# Patient Record
Sex: Female | Born: 1935 | Race: White | Hispanic: No | Smoking: Never smoker
Health system: Southern US, Community
[De-identification: ages and names within clinical notes are randomized; demographics above are authoritative.]

## PROBLEM LIST (undated history)

## (undated) DIAGNOSIS — N183 Chronic kidney disease, stage 3 unspecified: Secondary | ICD-10-CM

## (undated) DIAGNOSIS — I719 Aortic aneurysm of unspecified site, without rupture: Secondary | ICD-10-CM

## (undated) DIAGNOSIS — E785 Hyperlipidemia, unspecified: Secondary | ICD-10-CM

## (undated) DIAGNOSIS — M1990 Unspecified osteoarthritis, unspecified site: Secondary | ICD-10-CM

## (undated) DIAGNOSIS — E669 Obesity, unspecified: Secondary | ICD-10-CM

## (undated) DIAGNOSIS — I82409 Acute embolism and thrombosis of unspecified deep veins of unspecified lower extremity: Secondary | ICD-10-CM

## (undated) DIAGNOSIS — F32A Depression, unspecified: Secondary | ICD-10-CM

## (undated) DIAGNOSIS — Z86711 Personal history of pulmonary embolism: Secondary | ICD-10-CM

## (undated) DIAGNOSIS — I4891 Unspecified atrial fibrillation: Secondary | ICD-10-CM

## (undated) DIAGNOSIS — I5032 Chronic diastolic (congestive) heart failure: Secondary | ICD-10-CM

## (undated) DIAGNOSIS — Z9289 Personal history of other medical treatment: Secondary | ICD-10-CM

## (undated) DIAGNOSIS — C44300 Unspecified malignant neoplasm of skin of unspecified part of face: Secondary | ICD-10-CM

## (undated) DIAGNOSIS — S7292XA Unspecified fracture of left femur, initial encounter for closed fracture: Secondary | ICD-10-CM

## (undated) DIAGNOSIS — I10 Essential (primary) hypertension: Secondary | ICD-10-CM

## (undated) DIAGNOSIS — F329 Major depressive disorder, single episode, unspecified: Secondary | ICD-10-CM

## (undated) HISTORY — PX: HYSTEROTOMY: SHX1776

## (undated) HISTORY — DX: Essential (primary) hypertension: I10

## (undated) HISTORY — PX: CHOLECYSTECTOMY: SHX55

## (undated) HISTORY — DX: Major depressive disorder, single episode, unspecified: F32.9

## (undated) HISTORY — PX: CATARACT EXTRACTION: SUR2

## (undated) HISTORY — DX: Hyperlipidemia, unspecified: E78.5

## (undated) HISTORY — PX: HERNIA REPAIR: SHX51

## (undated) HISTORY — PX: VENA CAVA FILTER PLACEMENT: SUR1032

## (undated) HISTORY — DX: Unspecified osteoarthritis, unspecified site: M19.90

## (undated) HISTORY — PX: OSTEOTOMY: SHX137

## (undated) HISTORY — DX: Obesity, unspecified: E66.9

## (undated) HISTORY — DX: Unspecified atrial fibrillation: I48.91

## (undated) HISTORY — DX: Aortic aneurysm of unspecified site, without rupture: I71.9

## (undated) HISTORY — DX: Depression, unspecified: F32.A

---
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
## 1998-03-16 ENCOUNTER — Encounter: Admission: RE | Admit: 1998-03-16 | Discharge: 1998-03-16 | Payer: Self-pay | Admitting: Family Medicine

## 1998-04-13 ENCOUNTER — Encounter: Admission: RE | Admit: 1998-04-13 | Discharge: 1998-04-13 | Payer: Self-pay | Admitting: Family Medicine

## 1998-04-15 ENCOUNTER — Encounter: Admission: RE | Admit: 1998-04-15 | Discharge: 1998-04-15 | Payer: Self-pay | Admitting: Family Medicine

## 1998-05-11 ENCOUNTER — Encounter: Admission: RE | Admit: 1998-05-11 | Discharge: 1998-05-11 | Payer: Self-pay | Admitting: Family Medicine

## 1998-05-25 ENCOUNTER — Encounter: Admission: RE | Admit: 1998-05-25 | Discharge: 1998-05-25 | Payer: Self-pay | Admitting: Family Medicine

## 1998-06-22 ENCOUNTER — Encounter: Admission: RE | Admit: 1998-06-22 | Discharge: 1998-06-22 | Payer: Self-pay | Admitting: Family Medicine

## 1998-08-01 ENCOUNTER — Encounter: Admission: RE | Admit: 1998-08-01 | Discharge: 1998-08-01 | Payer: Self-pay | Admitting: Family Medicine

## 1998-08-15 ENCOUNTER — Encounter: Admission: RE | Admit: 1998-08-15 | Discharge: 1998-08-15 | Payer: Self-pay | Admitting: Family Medicine

## 1998-09-16 ENCOUNTER — Encounter: Admission: RE | Admit: 1998-09-16 | Discharge: 1998-09-16 | Payer: Self-pay | Admitting: Family Medicine

## 1998-09-29 ENCOUNTER — Encounter: Admission: RE | Admit: 1998-09-29 | Discharge: 1998-09-29 | Payer: Self-pay | Admitting: Family Medicine

## 1998-10-28 ENCOUNTER — Ambulatory Visit (HOSPITAL_COMMUNITY): Admission: RE | Admit: 1998-10-28 | Discharge: 1998-10-28 | Payer: Self-pay

## 1998-10-28 ENCOUNTER — Encounter: Admission: RE | Admit: 1998-10-28 | Discharge: 1998-10-28 | Payer: Self-pay | Admitting: Family Medicine

## 1998-11-25 ENCOUNTER — Encounter: Admission: RE | Admit: 1998-11-25 | Discharge: 1998-11-25 | Payer: Self-pay | Admitting: Sports Medicine

## 1998-11-30 ENCOUNTER — Ambulatory Visit (HOSPITAL_COMMUNITY): Admission: RE | Admit: 1998-11-30 | Discharge: 1998-11-30 | Payer: Self-pay

## 1998-12-02 ENCOUNTER — Encounter: Admission: RE | Admit: 1998-12-02 | Discharge: 1998-12-02 | Payer: Self-pay | Admitting: Family Medicine

## 1999-01-06 ENCOUNTER — Encounter: Admission: RE | Admit: 1999-01-06 | Discharge: 1999-01-06 | Payer: Self-pay | Admitting: Family Medicine

## 1999-01-06 ENCOUNTER — Ambulatory Visit (HOSPITAL_COMMUNITY): Admission: RE | Admit: 1999-01-06 | Discharge: 1999-01-06 | Payer: Self-pay | Admitting: Family Medicine

## 1999-01-31 ENCOUNTER — Encounter: Admission: RE | Admit: 1999-01-31 | Discharge: 1999-01-31 | Payer: Self-pay | Admitting: Sports Medicine

## 1999-03-15 ENCOUNTER — Encounter: Admission: RE | Admit: 1999-03-15 | Discharge: 1999-03-15 | Payer: Self-pay | Admitting: Family Medicine

## 1999-04-10 ENCOUNTER — Encounter: Admission: RE | Admit: 1999-04-10 | Discharge: 1999-04-10 | Payer: Self-pay | Admitting: Family Medicine

## 1999-04-24 ENCOUNTER — Encounter: Admission: RE | Admit: 1999-04-24 | Discharge: 1999-04-24 | Payer: Self-pay | Admitting: Family Medicine

## 1999-06-21 ENCOUNTER — Ambulatory Visit (HOSPITAL_COMMUNITY): Admission: RE | Admit: 1999-06-21 | Discharge: 1999-06-21 | Payer: Self-pay | Admitting: *Deleted

## 1999-07-28 ENCOUNTER — Encounter: Admission: RE | Admit: 1999-07-28 | Discharge: 1999-07-28 | Payer: Self-pay | Admitting: Family Medicine

## 1999-09-01 ENCOUNTER — Encounter: Admission: RE | Admit: 1999-09-01 | Discharge: 1999-09-01 | Payer: Self-pay | Admitting: Family Medicine

## 1999-10-20 ENCOUNTER — Encounter (INDEPENDENT_AMBULATORY_CARE_PROVIDER_SITE_OTHER): Payer: Self-pay | Admitting: *Deleted

## 1999-10-23 ENCOUNTER — Encounter: Admission: RE | Admit: 1999-10-23 | Discharge: 1999-10-23 | Payer: Self-pay | Admitting: Family Medicine

## 1999-10-23 ENCOUNTER — Other Ambulatory Visit: Admission: RE | Admit: 1999-10-23 | Discharge: 1999-11-09 | Payer: Self-pay | Admitting: Obstetrics & Gynecology

## 1999-11-06 ENCOUNTER — Encounter: Admission: RE | Admit: 1999-11-06 | Discharge: 1999-11-06 | Payer: Self-pay | Admitting: Sports Medicine

## 2000-01-22 ENCOUNTER — Encounter: Admission: RE | Admit: 2000-01-22 | Discharge: 2000-01-22 | Payer: Self-pay | Admitting: Family Medicine

## 2000-07-04 ENCOUNTER — Encounter: Admission: RE | Admit: 2000-07-04 | Discharge: 2000-07-04 | Payer: Self-pay | Admitting: Family Medicine

## 2000-07-25 ENCOUNTER — Encounter: Admission: RE | Admit: 2000-07-25 | Discharge: 2000-07-25 | Payer: Self-pay | Admitting: Family Medicine

## 2000-09-17 ENCOUNTER — Encounter: Admission: RE | Admit: 2000-09-17 | Discharge: 2000-09-17 | Payer: Self-pay | Admitting: Family Medicine

## 2000-12-25 ENCOUNTER — Encounter: Admission: RE | Admit: 2000-12-25 | Discharge: 2000-12-25 | Payer: Self-pay | Admitting: Family Medicine

## 2001-02-04 ENCOUNTER — Encounter: Admission: RE | Admit: 2001-02-04 | Discharge: 2001-02-04 | Payer: Self-pay | Admitting: *Deleted

## 2001-02-04 ENCOUNTER — Encounter: Admission: RE | Admit: 2001-02-04 | Discharge: 2001-02-04 | Payer: Self-pay | Admitting: Family Medicine

## 2001-02-11 ENCOUNTER — Encounter: Admission: RE | Admit: 2001-02-11 | Discharge: 2001-02-11 | Payer: Self-pay | Admitting: Family Medicine

## 2001-04-16 ENCOUNTER — Encounter: Admission: RE | Admit: 2001-04-16 | Discharge: 2001-04-16 | Payer: Self-pay | Admitting: Family Medicine

## 2001-04-28 ENCOUNTER — Encounter: Admission: RE | Admit: 2001-04-28 | Discharge: 2001-04-28 | Payer: Self-pay | Admitting: Sports Medicine

## 2001-06-23 ENCOUNTER — Encounter: Admission: RE | Admit: 2001-06-23 | Discharge: 2001-06-23 | Payer: Self-pay | Admitting: Sports Medicine

## 2001-06-26 ENCOUNTER — Encounter: Admission: RE | Admit: 2001-06-26 | Discharge: 2001-06-26 | Payer: Self-pay | Admitting: Family Medicine

## 2001-07-15 ENCOUNTER — Encounter: Admission: RE | Admit: 2001-07-15 | Discharge: 2001-07-15 | Payer: Self-pay | Admitting: Family Medicine

## 2001-07-31 ENCOUNTER — Encounter: Admission: RE | Admit: 2001-07-31 | Discharge: 2001-07-31 | Payer: Self-pay | Admitting: Family Medicine

## 2001-09-17 ENCOUNTER — Encounter: Admission: RE | Admit: 2001-09-17 | Discharge: 2001-09-17 | Payer: Self-pay | Admitting: Family Medicine

## 2001-10-29 ENCOUNTER — Encounter: Admission: RE | Admit: 2001-10-29 | Discharge: 2001-10-29 | Payer: Self-pay | Admitting: Family Medicine

## 2001-12-24 ENCOUNTER — Encounter: Payer: Self-pay | Admitting: Sports Medicine

## 2001-12-24 ENCOUNTER — Encounter: Admission: RE | Admit: 2001-12-24 | Discharge: 2001-12-24 | Payer: Self-pay | Admitting: Sports Medicine

## 2001-12-24 ENCOUNTER — Encounter: Admission: RE | Admit: 2001-12-24 | Discharge: 2001-12-24 | Payer: Self-pay | Admitting: Family Medicine

## 2001-12-31 ENCOUNTER — Encounter: Admission: RE | Admit: 2001-12-31 | Discharge: 2001-12-31 | Payer: Self-pay | Admitting: Sports Medicine

## 2001-12-31 ENCOUNTER — Encounter: Payer: Self-pay | Admitting: Sports Medicine

## 2002-01-07 ENCOUNTER — Encounter: Admission: RE | Admit: 2002-01-07 | Discharge: 2002-01-07 | Payer: Self-pay | Admitting: Family Medicine

## 2002-01-21 ENCOUNTER — Encounter: Admission: RE | Admit: 2002-01-21 | Discharge: 2002-01-21 | Payer: Self-pay | Admitting: Family Medicine

## 2002-01-26 ENCOUNTER — Encounter: Admission: RE | Admit: 2002-01-26 | Discharge: 2002-01-26 | Payer: Self-pay | Admitting: *Deleted

## 2002-02-04 ENCOUNTER — Encounter: Admission: RE | Admit: 2002-02-04 | Discharge: 2002-02-04 | Payer: Self-pay | Admitting: Family Medicine

## 2002-02-09 ENCOUNTER — Encounter: Admission: RE | Admit: 2002-02-09 | Discharge: 2002-02-09 | Payer: Self-pay | Admitting: Sports Medicine

## 2002-02-09 ENCOUNTER — Encounter: Payer: Self-pay | Admitting: Sports Medicine

## 2002-02-20 ENCOUNTER — Encounter: Admission: RE | Admit: 2002-02-20 | Discharge: 2002-02-20 | Payer: Self-pay | Admitting: Family Medicine

## 2002-02-23 ENCOUNTER — Encounter: Admission: RE | Admit: 2002-02-23 | Discharge: 2002-02-23 | Payer: Self-pay | Admitting: Sports Medicine

## 2002-02-23 ENCOUNTER — Encounter: Payer: Self-pay | Admitting: Sports Medicine

## 2002-03-09 ENCOUNTER — Encounter: Admission: RE | Admit: 2002-03-09 | Discharge: 2002-03-09 | Payer: Self-pay | Admitting: Sports Medicine

## 2002-03-09 ENCOUNTER — Encounter: Payer: Self-pay | Admitting: Sports Medicine

## 2002-03-23 ENCOUNTER — Encounter: Admission: RE | Admit: 2002-03-23 | Discharge: 2002-03-23 | Payer: Self-pay | Admitting: Family Medicine

## 2002-05-21 ENCOUNTER — Encounter: Admission: RE | Admit: 2002-05-21 | Discharge: 2002-05-21 | Payer: Self-pay | Admitting: Family Medicine

## 2002-06-23 ENCOUNTER — Encounter: Admission: RE | Admit: 2002-06-23 | Discharge: 2002-06-23 | Payer: Self-pay | Admitting: Family Medicine

## 2002-07-23 ENCOUNTER — Encounter: Admission: RE | Admit: 2002-07-23 | Discharge: 2002-07-23 | Payer: Self-pay | Admitting: Family Medicine

## 2002-08-26 ENCOUNTER — Encounter: Admission: RE | Admit: 2002-08-26 | Discharge: 2002-08-26 | Payer: Self-pay | Admitting: Family Medicine

## 2002-10-26 ENCOUNTER — Encounter: Admission: RE | Admit: 2002-10-26 | Discharge: 2002-10-26 | Payer: Self-pay | Admitting: Family Medicine

## 2002-11-19 HISTORY — PX: TOTAL KNEE ARTHROPLASTY: SHX125

## 2002-11-25 ENCOUNTER — Encounter: Admission: RE | Admit: 2002-11-25 | Discharge: 2002-11-25 | Payer: Self-pay | Admitting: Family Medicine

## 2003-01-19 ENCOUNTER — Encounter: Admission: RE | Admit: 2003-01-19 | Discharge: 2003-01-19 | Payer: Self-pay | Admitting: Sports Medicine

## 2003-03-15 ENCOUNTER — Encounter: Admission: RE | Admit: 2003-03-15 | Discharge: 2003-03-15 | Payer: Self-pay | Admitting: Family Medicine

## 2003-03-23 ENCOUNTER — Encounter: Admission: RE | Admit: 2003-03-23 | Discharge: 2003-03-23 | Payer: Self-pay | Admitting: Family Medicine

## 2003-04-02 ENCOUNTER — Encounter: Admission: RE | Admit: 2003-04-02 | Discharge: 2003-04-02 | Payer: Self-pay | Admitting: Family Medicine

## 2003-04-16 ENCOUNTER — Encounter: Admission: RE | Admit: 2003-04-16 | Discharge: 2003-04-16 | Payer: Self-pay | Admitting: Family Medicine

## 2003-04-28 ENCOUNTER — Encounter: Admission: RE | Admit: 2003-04-28 | Discharge: 2003-04-28 | Payer: Self-pay | Admitting: Family Medicine

## 2003-05-11 ENCOUNTER — Encounter: Admission: RE | Admit: 2003-05-11 | Discharge: 2003-05-11 | Payer: Self-pay | Admitting: Sports Medicine

## 2003-05-20 HISTORY — PX: OTHER SURGICAL HISTORY: SHX169

## 2003-05-27 ENCOUNTER — Inpatient Hospital Stay (HOSPITAL_COMMUNITY): Admission: RE | Admit: 2003-05-27 | Discharge: 2003-05-31 | Payer: Self-pay | Admitting: Orthopaedic Surgery

## 2003-05-27 ENCOUNTER — Encounter: Payer: Self-pay | Admitting: Orthopaedic Surgery

## 2003-05-31 ENCOUNTER — Inpatient Hospital Stay
Admission: RE | Admit: 2003-05-31 | Discharge: 2003-06-11 | Payer: Self-pay | Admitting: Physical Medicine & Rehabilitation

## 2003-07-05 ENCOUNTER — Inpatient Hospital Stay (HOSPITAL_COMMUNITY): Admission: EM | Admit: 2003-07-05 | Discharge: 2003-07-12 | Payer: Self-pay | Admitting: Emergency Medicine

## 2003-07-05 ENCOUNTER — Encounter: Payer: Self-pay | Admitting: Emergency Medicine

## 2003-07-06 ENCOUNTER — Encounter: Payer: Self-pay | Admitting: Critical Care Medicine

## 2003-07-06 ENCOUNTER — Encounter: Payer: Self-pay | Admitting: Cardiology

## 2003-07-07 ENCOUNTER — Encounter: Payer: Self-pay | Admitting: Family Medicine

## 2003-10-04 ENCOUNTER — Encounter: Admission: RE | Admit: 2003-10-04 | Discharge: 2003-10-04 | Payer: Self-pay | Admitting: Family Medicine

## 2003-10-25 ENCOUNTER — Encounter: Admission: RE | Admit: 2003-10-25 | Discharge: 2003-10-25 | Payer: Self-pay | Admitting: Family Medicine

## 2003-11-01 ENCOUNTER — Encounter: Admission: RE | Admit: 2003-11-01 | Discharge: 2003-11-01 | Payer: Self-pay | Admitting: Family Medicine

## 2003-11-08 ENCOUNTER — Encounter: Admission: RE | Admit: 2003-11-08 | Discharge: 2003-11-08 | Payer: Self-pay | Admitting: Family Medicine

## 2003-11-15 ENCOUNTER — Encounter: Admission: RE | Admit: 2003-11-15 | Discharge: 2003-11-15 | Payer: Self-pay | Admitting: Family Medicine

## 2003-11-22 ENCOUNTER — Encounter: Admission: RE | Admit: 2003-11-22 | Discharge: 2003-11-22 | Payer: Self-pay | Admitting: Family Medicine

## 2003-11-29 ENCOUNTER — Encounter: Admission: RE | Admit: 2003-11-29 | Discharge: 2003-11-29 | Payer: Self-pay | Admitting: Family Medicine

## 2003-12-06 ENCOUNTER — Encounter: Admission: RE | Admit: 2003-12-06 | Discharge: 2003-12-06 | Payer: Self-pay | Admitting: Family Medicine

## 2003-12-20 ENCOUNTER — Encounter: Admission: RE | Admit: 2003-12-20 | Discharge: 2003-12-20 | Payer: Self-pay | Admitting: Family Medicine

## 2004-01-03 ENCOUNTER — Encounter: Admission: RE | Admit: 2004-01-03 | Discharge: 2004-01-03 | Payer: Self-pay | Admitting: Family Medicine

## 2004-01-17 ENCOUNTER — Encounter: Admission: RE | Admit: 2004-01-17 | Discharge: 2004-01-17 | Payer: Self-pay | Admitting: Family Medicine

## 2004-01-31 ENCOUNTER — Encounter: Admission: RE | Admit: 2004-01-31 | Discharge: 2004-01-31 | Payer: Self-pay | Admitting: Family Medicine

## 2004-02-14 ENCOUNTER — Encounter: Admission: RE | Admit: 2004-02-14 | Discharge: 2004-02-14 | Payer: Self-pay | Admitting: Family Medicine

## 2004-03-06 ENCOUNTER — Encounter: Admission: RE | Admit: 2004-03-06 | Discharge: 2004-03-06 | Payer: Self-pay | Admitting: Family Medicine

## 2004-03-16 ENCOUNTER — Encounter: Admission: RE | Admit: 2004-03-16 | Discharge: 2004-03-16 | Payer: Self-pay | Admitting: Family Medicine

## 2004-03-27 ENCOUNTER — Encounter: Admission: RE | Admit: 2004-03-27 | Discharge: 2004-03-27 | Payer: Self-pay | Admitting: Family Medicine

## 2004-04-18 ENCOUNTER — Encounter: Admission: RE | Admit: 2004-04-18 | Discharge: 2004-04-18 | Payer: Self-pay | Admitting: Sports Medicine

## 2004-05-02 ENCOUNTER — Encounter: Admission: RE | Admit: 2004-05-02 | Discharge: 2004-05-02 | Payer: Self-pay | Admitting: Family Medicine

## 2004-05-15 ENCOUNTER — Encounter: Admission: RE | Admit: 2004-05-15 | Discharge: 2004-05-15 | Payer: Self-pay | Admitting: Family Medicine

## 2004-05-29 ENCOUNTER — Encounter: Admission: RE | Admit: 2004-05-29 | Discharge: 2004-05-29 | Payer: Self-pay | Admitting: Family Medicine

## 2004-06-12 ENCOUNTER — Encounter: Admission: RE | Admit: 2004-06-12 | Discharge: 2004-06-12 | Payer: Self-pay | Admitting: Family Medicine

## 2004-06-23 ENCOUNTER — Encounter: Admission: RE | Admit: 2004-06-23 | Discharge: 2004-06-23 | Payer: Self-pay | Admitting: Family Medicine

## 2004-07-10 ENCOUNTER — Encounter: Admission: RE | Admit: 2004-07-10 | Discharge: 2004-07-10 | Payer: Self-pay | Admitting: Family Medicine

## 2004-08-07 ENCOUNTER — Ambulatory Visit: Payer: Self-pay | Admitting: Family Medicine

## 2004-09-04 ENCOUNTER — Ambulatory Visit: Payer: Self-pay | Admitting: Family Medicine

## 2004-09-11 ENCOUNTER — Ambulatory Visit: Payer: Self-pay | Admitting: Family Medicine

## 2004-09-25 ENCOUNTER — Ambulatory Visit: Payer: Self-pay | Admitting: Family Medicine

## 2004-10-09 ENCOUNTER — Ambulatory Visit: Payer: Self-pay | Admitting: Family Medicine

## 2004-10-23 ENCOUNTER — Ambulatory Visit: Payer: Self-pay | Admitting: Family Medicine

## 2004-11-14 ENCOUNTER — Ambulatory Visit: Payer: Self-pay | Admitting: Family Medicine

## 2004-12-11 ENCOUNTER — Ambulatory Visit: Payer: Self-pay | Admitting: Family Medicine

## 2005-01-08 ENCOUNTER — Ambulatory Visit: Payer: Self-pay | Admitting: Family Medicine

## 2005-01-15 ENCOUNTER — Ambulatory Visit: Payer: Self-pay | Admitting: Family Medicine

## 2005-03-12 ENCOUNTER — Ambulatory Visit: Payer: Self-pay | Admitting: Family Medicine

## 2005-04-09 ENCOUNTER — Ambulatory Visit: Payer: Self-pay | Admitting: Family Medicine

## 2005-05-07 ENCOUNTER — Ambulatory Visit: Payer: Self-pay | Admitting: Family Medicine

## 2005-05-21 ENCOUNTER — Ambulatory Visit: Payer: Self-pay | Admitting: Family Medicine

## 2005-06-04 ENCOUNTER — Ambulatory Visit: Payer: Self-pay | Admitting: Family Medicine

## 2005-06-19 ENCOUNTER — Ambulatory Visit: Payer: Self-pay | Admitting: Sports Medicine

## 2005-07-02 ENCOUNTER — Ambulatory Visit: Payer: Self-pay | Admitting: Family Medicine

## 2005-07-16 ENCOUNTER — Ambulatory Visit: Payer: Self-pay | Admitting: Family Medicine

## 2005-08-06 ENCOUNTER — Ambulatory Visit: Payer: Self-pay | Admitting: Sports Medicine

## 2005-08-20 ENCOUNTER — Ambulatory Visit: Payer: Self-pay | Admitting: Family Medicine

## 2005-09-03 ENCOUNTER — Ambulatory Visit: Payer: Self-pay | Admitting: Family Medicine

## 2005-09-17 ENCOUNTER — Ambulatory Visit: Payer: Self-pay | Admitting: Sports Medicine

## 2005-10-01 ENCOUNTER — Ambulatory Visit: Payer: Self-pay | Admitting: Family Medicine

## 2005-10-22 ENCOUNTER — Ambulatory Visit: Payer: Self-pay | Admitting: Family Medicine

## 2005-11-09 ENCOUNTER — Ambulatory Visit: Payer: Self-pay | Admitting: Family Medicine

## 2005-11-20 ENCOUNTER — Ambulatory Visit: Payer: Self-pay | Admitting: Sports Medicine

## 2005-12-03 ENCOUNTER — Ambulatory Visit: Payer: Self-pay | Admitting: Family Medicine

## 2005-12-31 ENCOUNTER — Ambulatory Visit: Payer: Self-pay | Admitting: Family Medicine

## 2006-01-14 ENCOUNTER — Ambulatory Visit: Payer: Self-pay | Admitting: Family Medicine

## 2006-01-28 ENCOUNTER — Ambulatory Visit: Payer: Self-pay | Admitting: Family Medicine

## 2006-02-11 ENCOUNTER — Ambulatory Visit: Payer: Self-pay | Admitting: Family Medicine

## 2006-02-22 ENCOUNTER — Ambulatory Visit: Payer: Self-pay | Admitting: Family Medicine

## 2006-03-11 ENCOUNTER — Ambulatory Visit: Payer: Self-pay | Admitting: Family Medicine

## 2006-04-08 ENCOUNTER — Ambulatory Visit: Payer: Self-pay | Admitting: Family Medicine

## 2006-04-22 ENCOUNTER — Ambulatory Visit: Payer: Self-pay | Admitting: Family Medicine

## 2006-04-22 ENCOUNTER — Encounter: Admission: RE | Admit: 2006-04-22 | Discharge: 2006-04-22 | Payer: Self-pay | Admitting: Sports Medicine

## 2006-05-06 ENCOUNTER — Ambulatory Visit: Payer: Self-pay | Admitting: Family Medicine

## 2006-05-20 ENCOUNTER — Ambulatory Visit: Payer: Self-pay | Admitting: Family Medicine

## 2006-06-03 ENCOUNTER — Ambulatory Visit: Payer: Self-pay | Admitting: Family Medicine

## 2006-07-08 ENCOUNTER — Ambulatory Visit: Payer: Self-pay | Admitting: Family Medicine

## 2006-07-10 ENCOUNTER — Encounter: Admission: RE | Admit: 2006-07-10 | Discharge: 2006-07-10 | Payer: Self-pay | Admitting: Sports Medicine

## 2006-07-10 ENCOUNTER — Ambulatory Visit: Payer: Self-pay | Admitting: Family Medicine

## 2006-07-24 ENCOUNTER — Encounter: Admission: RE | Admit: 2006-07-24 | Discharge: 2006-07-24 | Payer: Self-pay | Admitting: Sports Medicine

## 2006-07-24 ENCOUNTER — Ambulatory Visit: Payer: Self-pay | Admitting: Family Medicine

## 2006-08-05 ENCOUNTER — Ambulatory Visit: Payer: Self-pay | Admitting: Family Medicine

## 2006-08-19 ENCOUNTER — Ambulatory Visit: Payer: Self-pay | Admitting: Family Medicine

## 2006-09-02 ENCOUNTER — Ambulatory Visit: Payer: Self-pay | Admitting: Family Medicine

## 2006-09-09 ENCOUNTER — Ambulatory Visit: Payer: Self-pay | Admitting: Family Medicine

## 2006-09-16 ENCOUNTER — Ambulatory Visit: Payer: Self-pay | Admitting: Family Medicine

## 2006-10-08 ENCOUNTER — Ambulatory Visit: Payer: Self-pay | Admitting: Family Medicine

## 2006-10-16 ENCOUNTER — Ambulatory Visit: Payer: Self-pay | Admitting: Family Medicine

## 2006-10-23 ENCOUNTER — Encounter: Admission: RE | Admit: 2006-10-23 | Discharge: 2006-11-19 | Payer: Self-pay | Admitting: Family Medicine

## 2006-11-04 ENCOUNTER — Ambulatory Visit: Payer: Self-pay | Admitting: Family Medicine

## 2006-11-20 ENCOUNTER — Encounter: Admission: RE | Admit: 2006-11-20 | Discharge: 2006-12-18 | Payer: Self-pay | Admitting: Family Medicine

## 2007-01-16 DIAGNOSIS — I5032 Chronic diastolic (congestive) heart failure: Secondary | ICD-10-CM

## 2007-01-16 DIAGNOSIS — I10 Essential (primary) hypertension: Secondary | ICD-10-CM

## 2007-01-16 DIAGNOSIS — M1990 Unspecified osteoarthritis, unspecified site: Secondary | ICD-10-CM

## 2007-01-16 DIAGNOSIS — N19 Unspecified kidney failure: Secondary | ICD-10-CM | POA: Insufficient documentation

## 2007-01-16 DIAGNOSIS — K5730 Diverticulosis of large intestine without perforation or abscess without bleeding: Secondary | ICD-10-CM | POA: Insufficient documentation

## 2007-01-16 DIAGNOSIS — E669 Obesity, unspecified: Secondary | ICD-10-CM

## 2007-01-17 ENCOUNTER — Encounter (INDEPENDENT_AMBULATORY_CARE_PROVIDER_SITE_OTHER): Payer: Self-pay | Admitting: *Deleted

## 2007-04-25 ENCOUNTER — Ambulatory Visit: Payer: Self-pay | Admitting: Sports Medicine

## 2007-05-16 ENCOUNTER — Ambulatory Visit: Payer: Self-pay | Admitting: Family Medicine

## 2007-05-16 ENCOUNTER — Encounter: Admission: RE | Admit: 2007-05-16 | Discharge: 2007-05-16 | Payer: Self-pay | Admitting: Family Medicine

## 2007-05-16 ENCOUNTER — Telehealth: Payer: Self-pay | Admitting: *Deleted

## 2007-05-26 ENCOUNTER — Telehealth: Payer: Self-pay | Admitting: *Deleted

## 2007-05-29 ENCOUNTER — Ambulatory Visit: Payer: Self-pay | Admitting: Family Medicine

## 2007-05-29 ENCOUNTER — Encounter (INDEPENDENT_AMBULATORY_CARE_PROVIDER_SITE_OTHER): Payer: Self-pay | Admitting: Family Medicine

## 2007-05-29 LAB — CONVERTED CEMR LAB
BUN: 40 mg/dL — ABNORMAL HIGH (ref 6–23)
CO2: 23 meq/L (ref 19–32)
Chloride: 105 meq/L (ref 96–112)
Glucose, Bld: 91 mg/dL (ref 70–99)
Potassium: 3.5 meq/L (ref 3.5–5.3)

## 2007-05-30 ENCOUNTER — Encounter (INDEPENDENT_AMBULATORY_CARE_PROVIDER_SITE_OTHER): Payer: Self-pay | Admitting: Family Medicine

## 2007-06-09 ENCOUNTER — Encounter: Admission: RE | Admit: 2007-06-09 | Discharge: 2007-06-09 | Payer: Self-pay | Admitting: Family Medicine

## 2007-06-10 ENCOUNTER — Inpatient Hospital Stay (HOSPITAL_COMMUNITY): Admission: EM | Admit: 2007-06-10 | Discharge: 2007-06-19 | Payer: Self-pay | Admitting: Emergency Medicine

## 2007-06-12 ENCOUNTER — Encounter (INDEPENDENT_AMBULATORY_CARE_PROVIDER_SITE_OTHER): Payer: Self-pay | Admitting: Orthopedic Surgery

## 2007-06-13 ENCOUNTER — Ambulatory Visit: Payer: Self-pay | Admitting: Internal Medicine

## 2007-06-15 ENCOUNTER — Encounter: Payer: Self-pay | Admitting: *Deleted

## 2007-06-15 DIAGNOSIS — I2789 Other specified pulmonary heart diseases: Secondary | ICD-10-CM

## 2007-06-15 DIAGNOSIS — D649 Anemia, unspecified: Secondary | ICD-10-CM

## 2007-06-17 ENCOUNTER — Encounter: Payer: Self-pay | Admitting: Orthopedic Surgery

## 2007-06-17 HISTORY — PX: ORIF TIBIA & FIBULA FRACTURES: SHX2131

## 2007-06-18 ENCOUNTER — Ambulatory Visit: Payer: Self-pay | Admitting: Physical Medicine & Rehabilitation

## 2007-06-19 ENCOUNTER — Inpatient Hospital Stay (HOSPITAL_COMMUNITY)
Admission: RE | Admit: 2007-06-19 | Discharge: 2007-07-07 | Payer: Self-pay | Admitting: Physical Medicine & Rehabilitation

## 2007-06-30 ENCOUNTER — Telehealth: Payer: Self-pay | Admitting: *Deleted

## 2007-07-11 ENCOUNTER — Encounter: Payer: Self-pay | Admitting: *Deleted

## 2007-07-16 ENCOUNTER — Encounter (INDEPENDENT_AMBULATORY_CARE_PROVIDER_SITE_OTHER): Payer: Self-pay | Admitting: Family Medicine

## 2007-07-18 ENCOUNTER — Encounter (INDEPENDENT_AMBULATORY_CARE_PROVIDER_SITE_OTHER): Payer: Self-pay | Admitting: Family Medicine

## 2007-07-18 ENCOUNTER — Telehealth (INDEPENDENT_AMBULATORY_CARE_PROVIDER_SITE_OTHER): Payer: Self-pay | Admitting: Family Medicine

## 2007-07-30 ENCOUNTER — Ambulatory Visit: Payer: Self-pay | Admitting: Family Medicine

## 2007-08-04 ENCOUNTER — Telehealth (INDEPENDENT_AMBULATORY_CARE_PROVIDER_SITE_OTHER): Payer: Self-pay | Admitting: Family Medicine

## 2007-08-04 ENCOUNTER — Encounter: Admission: RE | Admit: 2007-08-04 | Discharge: 2007-08-04 | Payer: Self-pay | Admitting: Family Medicine

## 2007-08-12 ENCOUNTER — Encounter (INDEPENDENT_AMBULATORY_CARE_PROVIDER_SITE_OTHER): Payer: Self-pay | Admitting: Family Medicine

## 2007-08-12 ENCOUNTER — Telehealth (INDEPENDENT_AMBULATORY_CARE_PROVIDER_SITE_OTHER): Payer: Self-pay | Admitting: Family Medicine

## 2007-08-12 DIAGNOSIS — I712 Thoracic aortic aneurysm, without rupture, unspecified: Secondary | ICD-10-CM | POA: Insufficient documentation

## 2007-08-13 ENCOUNTER — Telehealth: Payer: Self-pay | Admitting: *Deleted

## 2007-08-15 ENCOUNTER — Telehealth: Payer: Self-pay | Admitting: *Deleted

## 2007-08-18 ENCOUNTER — Ambulatory Visit: Payer: Self-pay | Admitting: Cardiology

## 2007-08-22 ENCOUNTER — Telehealth: Payer: Self-pay | Admitting: *Deleted

## 2007-09-01 ENCOUNTER — Ambulatory Visit: Payer: Self-pay | Admitting: Thoracic Surgery (Cardiothoracic Vascular Surgery)

## 2007-09-16 ENCOUNTER — Encounter (INDEPENDENT_AMBULATORY_CARE_PROVIDER_SITE_OTHER): Payer: Self-pay | Admitting: Family Medicine

## 2007-10-27 ENCOUNTER — Ambulatory Visit: Payer: Self-pay | Admitting: Cardiology

## 2007-10-29 ENCOUNTER — Ambulatory Visit: Payer: Self-pay

## 2007-10-29 ENCOUNTER — Encounter: Payer: Self-pay | Admitting: Cardiology

## 2008-01-01 ENCOUNTER — Encounter (INDEPENDENT_AMBULATORY_CARE_PROVIDER_SITE_OTHER): Payer: Self-pay | Admitting: Family Medicine

## 2008-01-01 ENCOUNTER — Telehealth: Payer: Self-pay | Admitting: *Deleted

## 2008-01-02 ENCOUNTER — Telehealth: Payer: Self-pay | Admitting: *Deleted

## 2008-01-08 ENCOUNTER — Telehealth: Payer: Self-pay | Admitting: *Deleted

## 2008-01-09 ENCOUNTER — Telehealth: Payer: Self-pay | Admitting: *Deleted

## 2008-01-12 ENCOUNTER — Ambulatory Visit: Payer: Self-pay | Admitting: Sports Medicine

## 2008-01-12 ENCOUNTER — Encounter (INDEPENDENT_AMBULATORY_CARE_PROVIDER_SITE_OTHER): Payer: Self-pay | Admitting: Family Medicine

## 2008-01-14 ENCOUNTER — Encounter (INDEPENDENT_AMBULATORY_CARE_PROVIDER_SITE_OTHER): Payer: Self-pay | Admitting: Family Medicine

## 2008-01-14 LAB — CONVERTED CEMR LAB
Cholesterol: 154 mg/dL (ref 0–200)
LDL Cholesterol: 67 mg/dL (ref 0–99)
Total CHOL/HDL Ratio: 3.4
VLDL: 42 mg/dL — ABNORMAL HIGH (ref 0–40)

## 2008-03-09 ENCOUNTER — Encounter
Admission: RE | Admit: 2008-03-09 | Discharge: 2008-03-09 | Payer: Self-pay | Admitting: Thoracic Surgery (Cardiothoracic Vascular Surgery)

## 2008-03-09 ENCOUNTER — Ambulatory Visit: Payer: Self-pay | Admitting: Thoracic Surgery (Cardiothoracic Vascular Surgery)

## 2008-04-13 ENCOUNTER — Ambulatory Visit: Payer: Self-pay | Admitting: Family Medicine

## 2008-04-13 ENCOUNTER — Encounter (INDEPENDENT_AMBULATORY_CARE_PROVIDER_SITE_OTHER): Payer: Self-pay | Admitting: Family Medicine

## 2008-04-13 LAB — CONVERTED CEMR LAB
AST: 17 units/L (ref 0–37)
Alkaline Phosphatase: 61 units/L (ref 39–117)
Creatinine, Ser: 1.63 mg/dL — ABNORMAL HIGH (ref 0.40–1.20)
Glucose, Urine, Semiquant: NEGATIVE
Protein, U semiquant: 100
Total Bilirubin: 0.6 mg/dL (ref 0.3–1.2)
Total Protein: 7.8 g/dL (ref 6.0–8.3)

## 2008-04-28 ENCOUNTER — Encounter: Admission: RE | Admit: 2008-04-28 | Discharge: 2008-04-28 | Payer: Self-pay | Admitting: Advanced Practice Midwife

## 2008-07-01 ENCOUNTER — Encounter (INDEPENDENT_AMBULATORY_CARE_PROVIDER_SITE_OTHER): Payer: Self-pay | Admitting: Family Medicine

## 2008-07-01 ENCOUNTER — Ambulatory Visit: Payer: Self-pay | Admitting: Family Medicine

## 2008-07-01 DIAGNOSIS — G589 Mononeuropathy, unspecified: Secondary | ICD-10-CM | POA: Insufficient documentation

## 2008-07-01 LAB — CONVERTED CEMR LAB

## 2008-07-02 ENCOUNTER — Ambulatory Visit: Payer: Self-pay | Admitting: Family Medicine

## 2008-07-02 ENCOUNTER — Encounter (INDEPENDENT_AMBULATORY_CARE_PROVIDER_SITE_OTHER): Payer: Self-pay | Admitting: Family Medicine

## 2008-07-02 DIAGNOSIS — E538 Deficiency of other specified B group vitamins: Secondary | ICD-10-CM

## 2008-07-02 LAB — CONVERTED CEMR LAB
BUN: 29 mg/dL — ABNORMAL HIGH (ref 6–23)
CO2: 22 meq/L (ref 19–32)
Chloride: 106 meq/L (ref 96–112)
Glucose, Bld: 102 mg/dL — ABNORMAL HIGH (ref 70–99)
Hemoglobin: 15 g/dL (ref 12.0–15.0)
MCHC: 30.9 g/dL (ref 30.0–36.0)
Platelets: 147 10*3/uL — ABNORMAL LOW (ref 150–400)
RBC: 4.62 M/uL (ref 3.87–5.11)
RDW: 13.6 % (ref 11.5–15.5)
Sodium: 140 meq/L (ref 135–145)
WBC: 6.3 10*3/uL (ref 4.0–10.5)

## 2008-07-13 LAB — CONVERTED CEMR LAB: Homocysteine: 23 micromoles/L — ABNORMAL HIGH (ref 4.0–15.4)

## 2008-07-14 ENCOUNTER — Telehealth: Payer: Self-pay | Admitting: *Deleted

## 2008-08-03 ENCOUNTER — Telehealth: Payer: Self-pay | Admitting: *Deleted

## 2008-08-23 ENCOUNTER — Encounter (INDEPENDENT_AMBULATORY_CARE_PROVIDER_SITE_OTHER): Payer: Self-pay | Admitting: Family Medicine

## 2008-08-24 ENCOUNTER — Ambulatory Visit (HOSPITAL_COMMUNITY)
Admission: RE | Admit: 2008-08-24 | Discharge: 2008-08-24 | Payer: Self-pay | Admitting: Thoracic Surgery (Cardiothoracic Vascular Surgery)

## 2008-08-24 ENCOUNTER — Encounter: Payer: Self-pay | Admitting: Thoracic Surgery (Cardiothoracic Vascular Surgery)

## 2008-08-25 ENCOUNTER — Ambulatory Visit: Payer: Self-pay | Admitting: Family Medicine

## 2008-08-25 ENCOUNTER — Encounter (INDEPENDENT_AMBULATORY_CARE_PROVIDER_SITE_OTHER): Payer: Self-pay | Admitting: Family Medicine

## 2008-08-25 LAB — CONVERTED CEMR LAB: Vitamin B-12: 986 pg/mL — ABNORMAL HIGH (ref 211–911)

## 2008-08-26 ENCOUNTER — Encounter (INDEPENDENT_AMBULATORY_CARE_PROVIDER_SITE_OTHER): Payer: Self-pay | Admitting: Family Medicine

## 2008-08-26 ENCOUNTER — Ambulatory Visit: Payer: Self-pay | Admitting: Thoracic Surgery (Cardiothoracic Vascular Surgery)

## 2008-09-07 ENCOUNTER — Telehealth (INDEPENDENT_AMBULATORY_CARE_PROVIDER_SITE_OTHER): Payer: Self-pay | Admitting: Family Medicine

## 2008-10-21 IMAGING — CT CT CHEST W/O CM
2 of 4 series · 15 of 36 positions shown, 18 images · IV contrast (agent unspecified)
Comparison: Chest x-ray, 05/16/07.

CLINICAL DATA: Pulmonary nodule on chest x-ray.  Chronic shortness of breath. 
CHEST CT WITHOUT CONTRAST:
TECHNIQUE: Multidetector CT imaging of the chest was performed following the standard protocol without IV contrast.

[Series 4: lung windows · axial · 0.64mm/px · z∈[-332,-70]mm · 12 of 63 slices shown, 15 images]
[im 5/63  mediastinal]
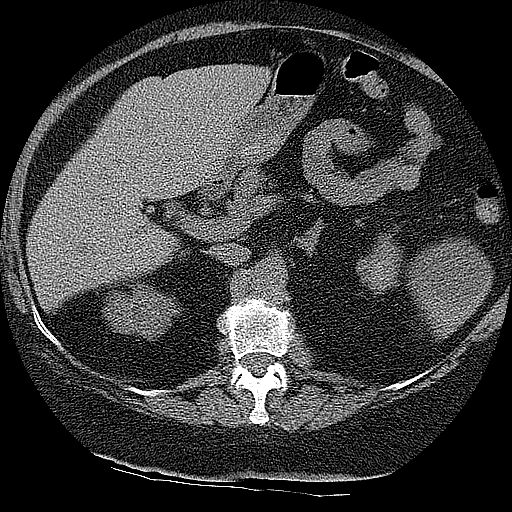
[im 5/63  lung]
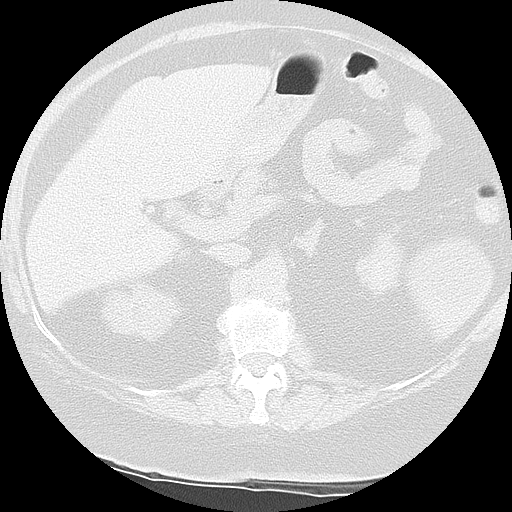
[im 10/63  lung]
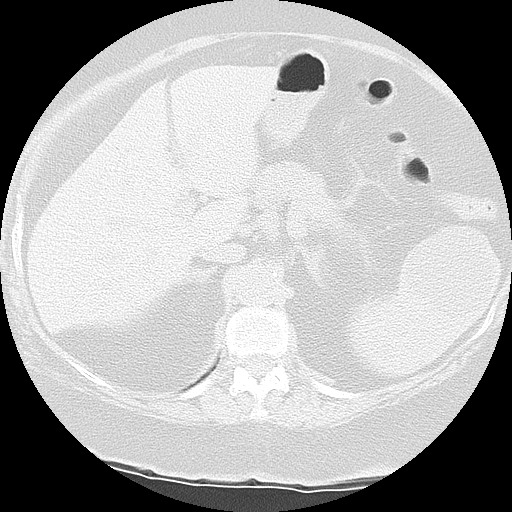
[im 15/63  lung]
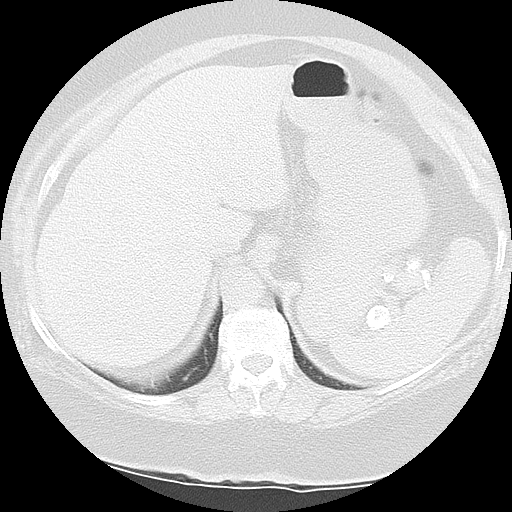
[im 20/63  lung]
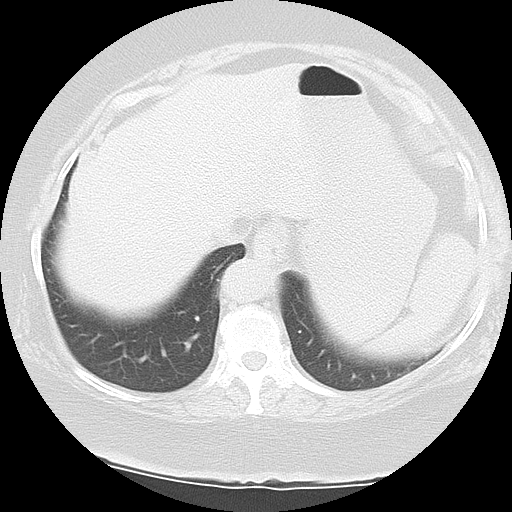
[im 24/63  mediastinal]
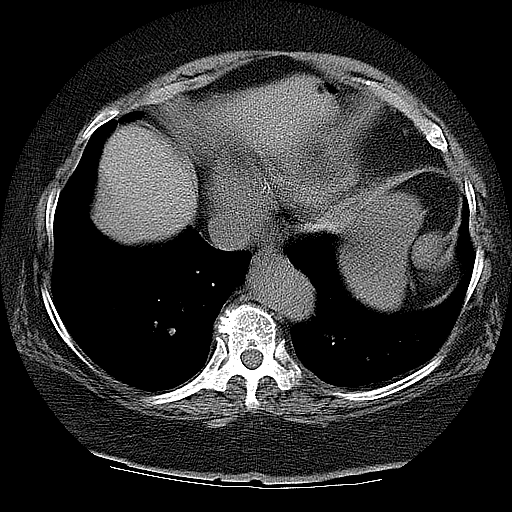
[im 24/63  lung]
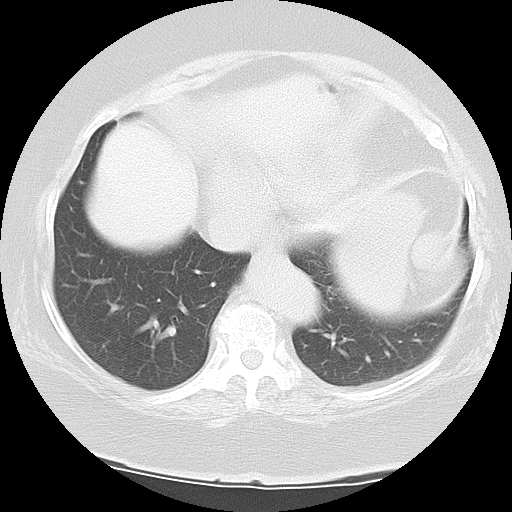
[im 29/63  lung]
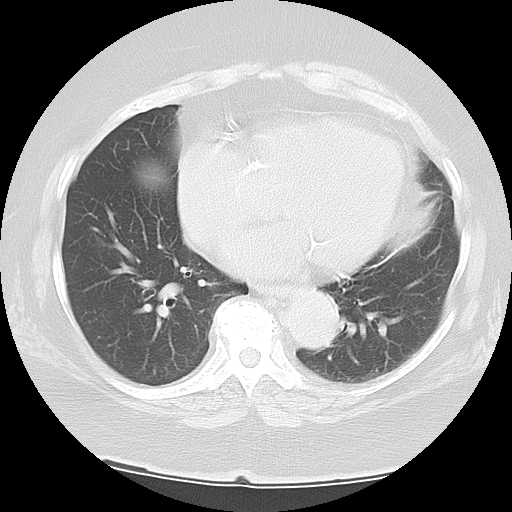
[im 34/63  lung]
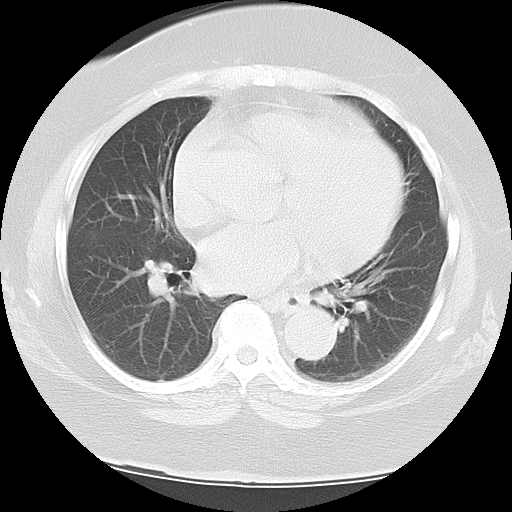
[im 39/63  lung]
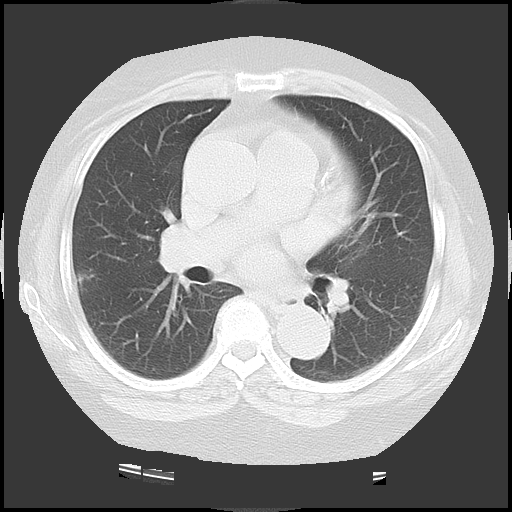
[im 43/63  mediastinal]
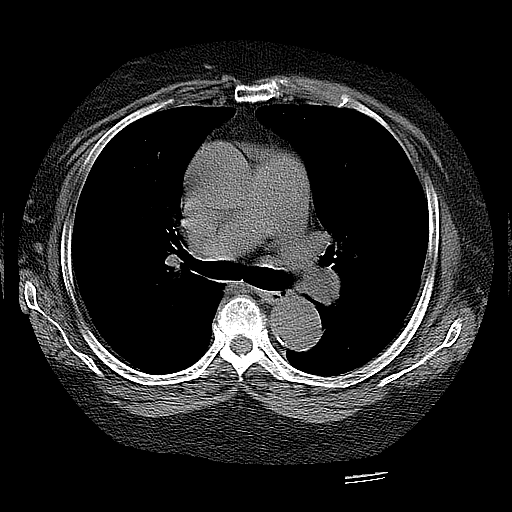
[im 43/63  lung]
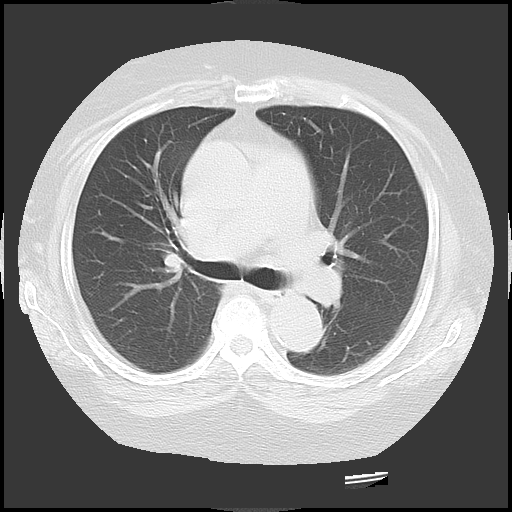
[im 48/63  lung]
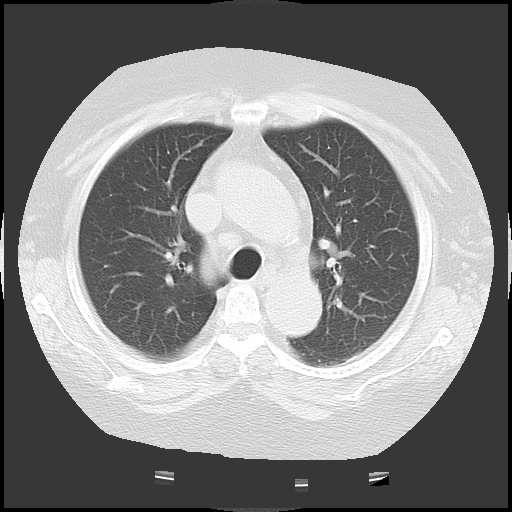
[im 53/63  lung]
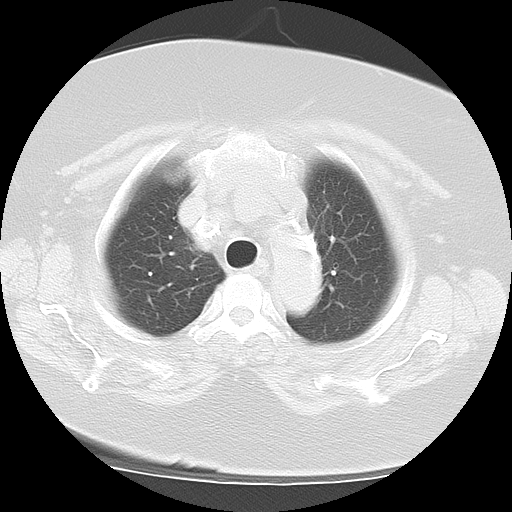
[im 58/63  lung]
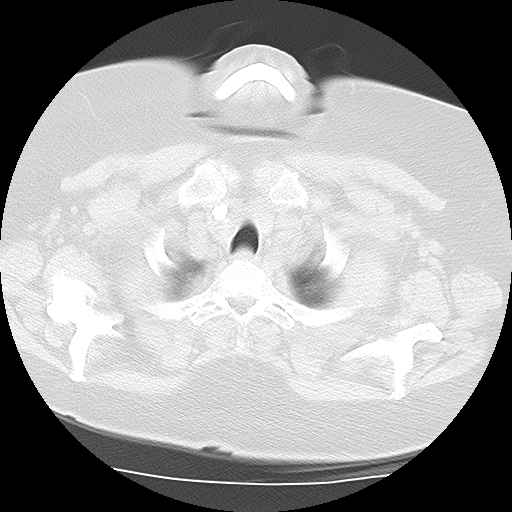

[Series 602: sagittal body · sagittal · 0.64mm/px · 3 of 133 slices shown]
[im 27/133  lung]
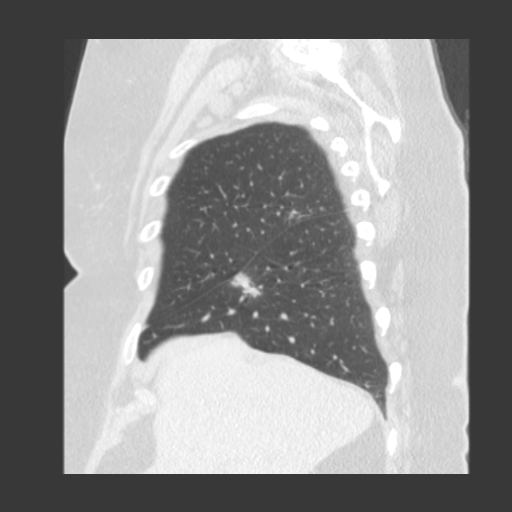
[im 53/133  lung]
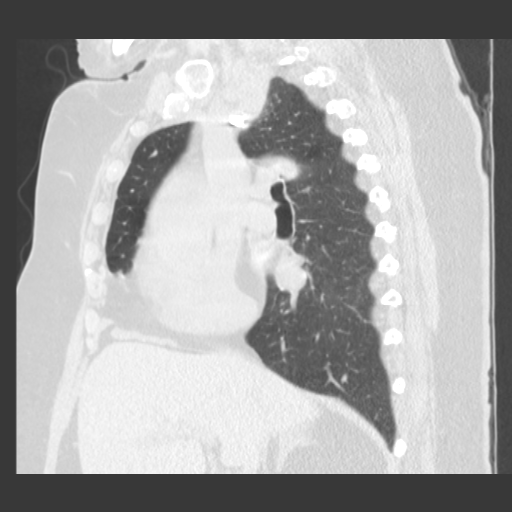
[im 80/133  lung]
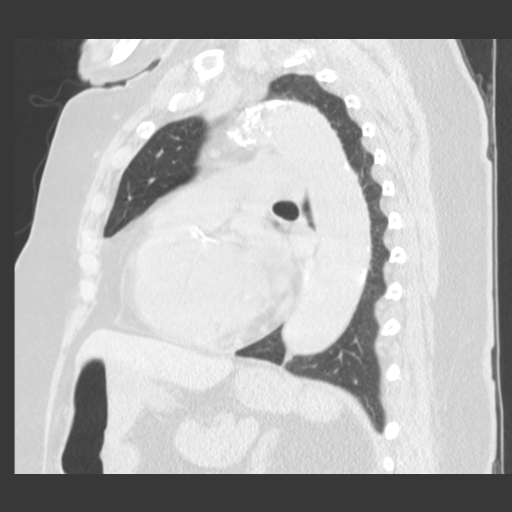

[15 of 36 positions shown; findings below may reference images not displayed]

FINDINGS: Low attenuation nodule and calcifications are seen in the right lobe of the thyroid.  Ascending aorta measures 4.5 x 4.6 cm.  Marked intimal calcification is seen in the right brachiocephalic artery.  The main pulmonary artery measures 3.7 cm.  Heart size is enlarged.  Coronary artery calcification.  No pericardial fluid.  A 1 cm short axis lymph node is seen anterior to the right mainstem bronchus.  No axillary adenopathy.    
Focal somewhat clustered air space nodules in the right lower lobe measure up to 1 cm.  Cluster of small nodules in the posterior segment of the right upper lobe are likely infectious or post infectious.  Scar in the right middle lobe and lingula.  The lungs are otherwise clear.  No pleural fluid.  Airway unremarkable.  
Incidental imaging of the upper abdomen shows a tiny calcification in the upper pole of the right kidney.  A 1.5 cm low attenuation lesion in the posterior interpolar right kidney is difficult to characterize on today?s study.  A large calcification is seen in the upper pole of the left kidney in a stag horn configuration.  Splenic cyst measures 5.5 x 5.6 cm.  Calcified splenic artery aneurysms measure 1.6 x 1.8 cm and 1.1 x 1.5 cm respectively.  No worrisome lytic or sclerotic lesions.
IMPRESSION: 1.  Clustered air space nodules in the right lower lobe.  Acute infection and malignancy are in the differential diagnosis.  Follow-up in 2 to 3 months recommended. 
2.  Cardiac enlargement, ascending aortic aneurysm, pulmonary atrial hypertension.
3.  Two splenic artery aneurysms. Large splenic cyst.
4.  Bilateral renal calculi with a staghorn appearance on the left.

## 2008-10-29 ENCOUNTER — Encounter: Payer: Self-pay | Admitting: Family Medicine

## 2008-11-09 ENCOUNTER — Telehealth: Payer: Self-pay | Admitting: *Deleted

## 2008-11-09 ENCOUNTER — Telehealth (INDEPENDENT_AMBULATORY_CARE_PROVIDER_SITE_OTHER): Payer: Self-pay | Admitting: Family Medicine

## 2008-11-23 ENCOUNTER — Encounter (INDEPENDENT_AMBULATORY_CARE_PROVIDER_SITE_OTHER): Payer: Self-pay | Admitting: Family Medicine

## 2008-11-23 ENCOUNTER — Ambulatory Visit: Payer: Self-pay | Admitting: Family Medicine

## 2008-11-23 LAB — CONVERTED CEMR LAB

## 2008-11-24 LAB — CONVERTED CEMR LAB
Albumin: 4.3 g/dL (ref 3.5–5.2)
Chloride: 107 meq/L (ref 96–112)
Creatinine, Ser: 1.3 mg/dL — ABNORMAL HIGH (ref 0.40–1.20)
Direct LDL: 107 mg/dL — ABNORMAL HIGH
Glucose, Bld: 95 mg/dL (ref 70–99)
Hemoglobin: 14.8 g/dL (ref 12.0–15.0)
Total Bilirubin: 0.8 mg/dL (ref 0.3–1.2)
Total Protein: 7.7 g/dL (ref 6.0–8.3)

## 2008-11-29 ENCOUNTER — Telehealth (INDEPENDENT_AMBULATORY_CARE_PROVIDER_SITE_OTHER): Payer: Self-pay | Admitting: Family Medicine

## 2009-01-04 ENCOUNTER — Telehealth (INDEPENDENT_AMBULATORY_CARE_PROVIDER_SITE_OTHER): Payer: Self-pay | Admitting: Family Medicine

## 2009-01-13 ENCOUNTER — Encounter
Admission: RE | Admit: 2009-01-13 | Discharge: 2009-01-13 | Payer: Self-pay | Admitting: Thoracic Surgery (Cardiothoracic Vascular Surgery)

## 2009-01-13 ENCOUNTER — Ambulatory Visit: Payer: Self-pay | Admitting: Thoracic Surgery (Cardiothoracic Vascular Surgery)

## 2009-02-01 ENCOUNTER — Telehealth (INDEPENDENT_AMBULATORY_CARE_PROVIDER_SITE_OTHER): Payer: Self-pay | Admitting: Family Medicine

## 2009-02-01 ENCOUNTER — Encounter (INDEPENDENT_AMBULATORY_CARE_PROVIDER_SITE_OTHER): Payer: Self-pay | Admitting: Family Medicine

## 2009-02-02 ENCOUNTER — Ambulatory Visit: Payer: Self-pay | Admitting: Family Medicine

## 2009-02-02 ENCOUNTER — Encounter (INDEPENDENT_AMBULATORY_CARE_PROVIDER_SITE_OTHER): Payer: Self-pay | Admitting: Family Medicine

## 2009-02-02 LAB — CONVERTED CEMR LAB: Pap Smear: NORMAL

## 2009-03-08 ENCOUNTER — Telehealth (INDEPENDENT_AMBULATORY_CARE_PROVIDER_SITE_OTHER): Payer: Self-pay | Admitting: Family Medicine

## 2009-04-21 ENCOUNTER — Encounter (INDEPENDENT_AMBULATORY_CARE_PROVIDER_SITE_OTHER): Payer: Self-pay | Admitting: Family Medicine

## 2009-05-02 ENCOUNTER — Encounter (INDEPENDENT_AMBULATORY_CARE_PROVIDER_SITE_OTHER): Payer: Self-pay | Admitting: Family Medicine

## 2009-05-02 ENCOUNTER — Ambulatory Visit: Payer: Self-pay | Admitting: Family Medicine

## 2009-05-02 LAB — CONVERTED CEMR LAB
Cholesterol: 146 mg/dL (ref 0–200)
HDL: 41 mg/dL (ref >39)

## 2009-05-03 ENCOUNTER — Encounter (INDEPENDENT_AMBULATORY_CARE_PROVIDER_SITE_OTHER): Payer: Self-pay | Admitting: Family Medicine

## 2009-05-05 ENCOUNTER — Telehealth (INDEPENDENT_AMBULATORY_CARE_PROVIDER_SITE_OTHER): Payer: Self-pay | Admitting: Family Medicine

## 2009-05-09 ENCOUNTER — Telehealth (INDEPENDENT_AMBULATORY_CARE_PROVIDER_SITE_OTHER): Payer: Self-pay | Admitting: Family Medicine

## 2009-05-10 ENCOUNTER — Telehealth (INDEPENDENT_AMBULATORY_CARE_PROVIDER_SITE_OTHER): Payer: Self-pay | Admitting: Family Medicine

## 2009-05-16 ENCOUNTER — Telehealth (INDEPENDENT_AMBULATORY_CARE_PROVIDER_SITE_OTHER): Payer: Self-pay | Admitting: Family Medicine

## 2009-05-16 ENCOUNTER — Encounter: Admission: RE | Admit: 2009-05-16 | Discharge: 2009-05-16 | Payer: Self-pay | Admitting: Family Medicine

## 2009-06-06 ENCOUNTER — Encounter: Payer: Self-pay | Admitting: Thoracic Surgery (Cardiothoracic Vascular Surgery)

## 2009-06-06 ENCOUNTER — Ambulatory Visit: Payer: Self-pay

## 2009-07-05 ENCOUNTER — Telehealth (INDEPENDENT_AMBULATORY_CARE_PROVIDER_SITE_OTHER): Payer: Self-pay | Admitting: Family Medicine

## 2009-07-06 ENCOUNTER — Ambulatory Visit: Payer: Self-pay | Admitting: Cardiology

## 2009-07-07 ENCOUNTER — Telehealth (INDEPENDENT_AMBULATORY_CARE_PROVIDER_SITE_OTHER): Payer: Self-pay | Admitting: Radiology

## 2009-07-11 ENCOUNTER — Ambulatory Visit: Payer: Self-pay | Admitting: Cardiology

## 2009-07-11 ENCOUNTER — Ambulatory Visit: Payer: Self-pay

## 2009-07-12 ENCOUNTER — Ambulatory Visit: Payer: Self-pay

## 2009-07-12 ENCOUNTER — Encounter: Payer: Self-pay | Admitting: Cardiovascular Disease

## 2009-07-19 LAB — CONVERTED CEMR LAB
AST: 23 units/L (ref 0–37)
Albumin: 3.9 g/dL (ref 3.5–5.2)
Alkaline Phosphatase: 53 units/L (ref 39–117)
CO2: 29 meq/L (ref 19–32)
Calcium: 10.1 mg/dL (ref 8.4–10.5)
Glucose, Bld: 108 mg/dL — ABNORMAL HIGH (ref 70–99)
Potassium: 3.9 meq/L (ref 3.5–5.1)
Pro B Natriuretic peptide (BNP): 100 pg/mL (ref 0.0–100.0)
Sodium: 142 meq/L (ref 135–145)
Total Protein: 8.4 g/dL — ABNORMAL HIGH (ref 6.0–8.3)

## 2009-07-20 ENCOUNTER — Telehealth: Payer: Self-pay | Admitting: Cardiology

## 2009-07-21 ENCOUNTER — Ambulatory Visit: Payer: Self-pay | Admitting: Cardiology

## 2009-07-21 DIAGNOSIS — I4891 Unspecified atrial fibrillation: Secondary | ICD-10-CM

## 2009-07-21 LAB — CONVERTED CEMR LAB: POC INR: 1

## 2009-07-27 ENCOUNTER — Ambulatory Visit: Payer: Self-pay | Admitting: Cardiology

## 2009-07-27 LAB — CONVERTED CEMR LAB: POC INR: 4.3

## 2009-08-03 ENCOUNTER — Ambulatory Visit: Payer: Self-pay | Admitting: Cardiology

## 2009-08-03 LAB — CONVERTED CEMR LAB: Prothrombin Time: 111.3 s

## 2009-08-08 ENCOUNTER — Ambulatory Visit: Payer: Self-pay | Admitting: Cardiology

## 2009-08-12 ENCOUNTER — Ambulatory Visit: Payer: Self-pay | Admitting: Internal Medicine

## 2009-08-17 ENCOUNTER — Ambulatory Visit: Payer: Self-pay | Admitting: Cardiovascular Disease

## 2009-08-29 ENCOUNTER — Ambulatory Visit: Payer: Self-pay | Admitting: Internal Medicine

## 2009-08-29 LAB — CONVERTED CEMR LAB: POC INR: 2.5

## 2009-09-08 ENCOUNTER — Telehealth: Payer: Self-pay | Admitting: *Deleted

## 2009-09-12 ENCOUNTER — Telehealth: Payer: Self-pay | Admitting: Family Medicine

## 2009-09-19 ENCOUNTER — Ambulatory Visit: Payer: Self-pay | Admitting: Internal Medicine

## 2009-09-19 LAB — CONVERTED CEMR LAB: POC INR: 1.4

## 2009-09-29 ENCOUNTER — Ambulatory Visit: Payer: Self-pay | Admitting: Internal Medicine

## 2009-09-29 LAB — CONVERTED CEMR LAB: POC INR: 1.7

## 2009-10-12 ENCOUNTER — Ambulatory Visit: Payer: Self-pay | Admitting: Internal Medicine

## 2009-10-12 LAB — CONVERTED CEMR LAB: POC INR: 2.3

## 2009-10-26 ENCOUNTER — Ambulatory Visit: Payer: Self-pay | Admitting: Cardiology

## 2009-10-26 LAB — CONVERTED CEMR LAB: POC INR: 2.5

## 2009-11-01 ENCOUNTER — Telehealth: Payer: Self-pay | Admitting: *Deleted

## 2009-11-16 ENCOUNTER — Ambulatory Visit: Payer: Self-pay | Admitting: Cardiology

## 2009-11-22 ENCOUNTER — Telehealth: Payer: Self-pay | Admitting: Family Medicine

## 2009-12-12 ENCOUNTER — Ambulatory Visit: Payer: Self-pay | Admitting: Family Medicine

## 2009-12-19 ENCOUNTER — Ambulatory Visit: Payer: Self-pay | Admitting: Cardiology

## 2010-01-03 ENCOUNTER — Telehealth: Payer: Self-pay | Admitting: Family Medicine

## 2010-01-16 ENCOUNTER — Ambulatory Visit: Payer: Self-pay | Admitting: Internal Medicine

## 2010-01-23 ENCOUNTER — Telehealth: Payer: Self-pay | Admitting: Family Medicine

## 2010-02-13 ENCOUNTER — Ambulatory Visit: Payer: Self-pay | Admitting: Internal Medicine

## 2010-02-13 LAB — CONVERTED CEMR LAB: POC INR: 1.7

## 2010-02-14 ENCOUNTER — Ambulatory Visit: Payer: Self-pay | Admitting: Thoracic Surgery (Cardiothoracic Vascular Surgery)

## 2010-02-14 ENCOUNTER — Encounter
Admission: RE | Admit: 2010-02-14 | Discharge: 2010-02-14 | Payer: Self-pay | Admitting: Thoracic Surgery (Cardiothoracic Vascular Surgery)

## 2010-02-20 ENCOUNTER — Telehealth (INDEPENDENT_AMBULATORY_CARE_PROVIDER_SITE_OTHER): Payer: Self-pay | Admitting: *Deleted

## 2010-03-01 ENCOUNTER — Telehealth: Payer: Self-pay | Admitting: *Deleted

## 2010-03-13 ENCOUNTER — Ambulatory Visit: Payer: Self-pay | Admitting: Family Medicine

## 2010-03-13 ENCOUNTER — Ambulatory Visit: Payer: Self-pay | Admitting: Cardiology

## 2010-03-22 ENCOUNTER — Telehealth: Payer: Self-pay | Admitting: Family Medicine

## 2010-04-04 ENCOUNTER — Telehealth: Payer: Self-pay | Admitting: *Deleted

## 2010-04-10 ENCOUNTER — Ambulatory Visit: Payer: Self-pay | Admitting: Cardiology

## 2010-04-20 ENCOUNTER — Encounter: Payer: Self-pay | Admitting: Family Medicine

## 2010-05-02 ENCOUNTER — Telehealth: Payer: Self-pay | Admitting: Family Medicine

## 2010-05-08 ENCOUNTER — Ambulatory Visit: Payer: Self-pay | Admitting: Cardiology

## 2010-05-08 LAB — CONVERTED CEMR LAB: POC INR: 1.8

## 2010-05-09 ENCOUNTER — Encounter (INDEPENDENT_AMBULATORY_CARE_PROVIDER_SITE_OTHER): Payer: Self-pay | Admitting: *Deleted

## 2010-05-17 ENCOUNTER — Telehealth: Payer: Self-pay | Admitting: Family Medicine

## 2010-05-18 ENCOUNTER — Telehealth: Payer: Self-pay | Admitting: Family Medicine

## 2010-05-19 ENCOUNTER — Ambulatory Visit: Payer: Self-pay | Admitting: Family Medicine

## 2010-05-29 ENCOUNTER — Ambulatory Visit: Payer: Self-pay | Admitting: Cardiology

## 2010-05-29 LAB — CONVERTED CEMR LAB: POC INR: 2.2

## 2010-05-31 ENCOUNTER — Telehealth: Payer: Self-pay | Admitting: Family Medicine

## 2010-06-01 ENCOUNTER — Ambulatory Visit: Payer: Self-pay | Admitting: Family Medicine

## 2010-06-01 DIAGNOSIS — R269 Unspecified abnormalities of gait and mobility: Secondary | ICD-10-CM

## 2010-06-26 ENCOUNTER — Ambulatory Visit: Payer: Self-pay | Admitting: Internal Medicine

## 2010-06-29 ENCOUNTER — Encounter: Payer: Self-pay | Admitting: Family Medicine

## 2010-08-01 ENCOUNTER — Ambulatory Visit: Payer: Self-pay | Admitting: Cardiology

## 2010-08-01 DIAGNOSIS — E785 Hyperlipidemia, unspecified: Secondary | ICD-10-CM

## 2010-08-01 LAB — CONVERTED CEMR LAB
AST: 22 units/L (ref 0–37)
Albumin: 3.6 g/dL (ref 3.5–5.2)
Alkaline Phosphatase: 50 units/L (ref 39–117)
Basophils Absolute: 0 10*3/uL (ref 0.0–0.1)
Bilirubin, Direct: 0.2 mg/dL (ref 0.0–0.3)
Calcium: 9.5 mg/dL (ref 8.4–10.5)
GFR calc non Af Amer: 50.42 mL/min (ref >60)
Glucose, Bld: 99 mg/dL (ref 70–99)
Hemoglobin: 14.9 g/dL (ref 12.0–15.0)
Lymphocytes Relative: 24.6 % (ref 12.0–46.0)
Monocytes Relative: 9.8 % (ref 3.0–12.0)
Neutro Abs: 4.4 10*3/uL (ref 1.4–7.7)
POC INR: 2.4
RDW: 13.6 % (ref 11.5–14.6)
Sodium: 141 meq/L (ref 135–145)
Total Bilirubin: 0.8 mg/dL (ref 0.3–1.2)

## 2010-08-09 ENCOUNTER — Encounter: Payer: Self-pay | Admitting: Family Medicine

## 2010-08-21 ENCOUNTER — Telehealth: Payer: Self-pay | Admitting: Family Medicine

## 2010-08-28 ENCOUNTER — Ambulatory Visit: Payer: Self-pay | Admitting: Internal Medicine

## 2010-08-29 ENCOUNTER — Telehealth: Payer: Self-pay | Admitting: *Deleted

## 2010-09-25 ENCOUNTER — Ambulatory Visit: Payer: Self-pay | Admitting: Cardiovascular Disease

## 2010-09-29 ENCOUNTER — Encounter: Payer: Self-pay | Admitting: Family Medicine

## 2010-10-04 ENCOUNTER — Ambulatory Visit: Payer: Self-pay

## 2010-10-10 ENCOUNTER — Ambulatory Visit: Payer: Self-pay | Admitting: Family Medicine

## 2010-10-10 ENCOUNTER — Encounter: Payer: Self-pay | Admitting: Family Medicine

## 2010-10-10 LAB — CONVERTED CEMR LAB
Albumin: 4.2 g/dL (ref 3.5–5.2)
BUN: 42 mg/dL — ABNORMAL HIGH (ref 6–23)
CO2: 26 meq/L (ref 19–32)
Calcium: 10.1 mg/dL (ref 8.4–10.5)
Chloride: 104 meq/L (ref 96–112)
Cholesterol: 156 mg/dL (ref 0–200)
Glucose, Bld: 101 mg/dL — ABNORMAL HIGH (ref 70–99)
HDL: 44 mg/dL (ref >39)
Hemoglobin: 14.8 g/dL (ref 12.0–15.0)
LDL Cholesterol: 66 mg/dL (ref 0–99)
Potassium: 4.4 meq/L (ref 3.5–5.3)
RBC: 4.6 M/uL (ref 3.87–5.11)
Triglycerides: 228 mg/dL — ABNORMAL HIGH (ref <150)
VLDL: 46 mg/dL — ABNORMAL HIGH (ref 0–40)

## 2010-10-11 ENCOUNTER — Encounter: Payer: Self-pay | Admitting: Family Medicine

## 2010-10-23 ENCOUNTER — Ambulatory Visit: Payer: Self-pay | Admitting: Cardiovascular Disease

## 2010-10-30 ENCOUNTER — Telehealth: Payer: Self-pay | Admitting: Family Medicine

## 2010-11-21 ENCOUNTER — Ambulatory Visit: Admission: RE | Admit: 2010-11-21 | Discharge: 2010-11-21 | Payer: Self-pay | Source: Home / Self Care

## 2010-11-21 LAB — CONVERTED CEMR LAB: POC INR: 2.7

## 2010-12-12 ENCOUNTER — Encounter (INDEPENDENT_AMBULATORY_CARE_PROVIDER_SITE_OTHER): Payer: Self-pay | Admitting: *Deleted

## 2010-12-18 ENCOUNTER — Ambulatory Visit: Admission: RE | Admit: 2010-12-18 | Discharge: 2010-12-18 | Payer: Self-pay | Source: Home / Self Care

## 2010-12-18 LAB — CONVERTED CEMR LAB: POC INR: 2.7

## 2010-12-21 NOTE — Medication Information (Signed)
Summary: rov/tm  Anticoagulant Therapy  Managed by: Cloyde Reams, RN, BSN Referring MD: D.Mclean PCP: Dr. Ferdinand Cava MD: Jens Som MD, Arlys John Indication 1: Atrial Fibrillation Lab Used: [facility] [place] Site: [address] INR POC 2.7 INR RANGE 2.0-3.0  Dietary changes: no    Health status changes: no    Bleeding/hemorrhagic complications: no    Recent/future hospitalizations: no    Any changes in medication regimen? no    Recent/future dental: no  Any missed doses?: no       Is patient compliant with meds? yes       Allergies: 1)  Sulfamethoxazole (Sulfamethoxazole)  Anticoagulation Management History:      The patient is taking warfarin and comes in today for a routine follow up visit.  Positive risk factors for bleeding include an age of 75 years or older.  The bleeding index is 'intermediate risk'.  Positive CHADS2 values include History of CHF, History of HTN, and Age > 17 years old.  Her last INR was 11.0 ratio.  Anticoagulation responsible provider: Jens Som MD, Arlys John.  INR POC: 2.7.  Cuvette Lot#: [id number].  Exp: 12/2011.    Anticoagulation Management Assessment/Plan:      The patient's current anticoagulation dose is Warfarin sodium 1 mg tabs: Take as directed by coumadin clinic..  The target INR is 2.0-3.0.  The next INR is due 12/18/2010.  Anticoagulation instructions were given to patient.  Results were reviewed/authorized by Cloyde Reams, RN, BSN.  She was notified by Cloyde Reams RN.         Prior Anticoagulation Instructions: INR 2.9 Continue 2mg s daily except 1mg s on Mondays, Wednesdays and Fridays. Recheck in 4 weeks.   Current Anticoagulation Instructions: INR 2.7  Continue on same dosage 2 tablets daily except 1 tablet on Mondays, Wednesdays, and Fridays.  Recheck in 4 weeks.

## 2010-12-21 NOTE — Progress Notes (Signed)
Summary: phnmsg   Phone Note From Other Clinic Call back at [phone]   Caller: [email or web address] Summary of Call: states that they do not have to dismiss her from being able to give B12 shots she is now covered and will continue with an OK from doctor Initial call taken by: De Nurse,  May 31, 2010 4:25 PM  Follow-up for Phone Call        that is fine Follow-up by: Angelena Sole MD,  May 31, 2010 4:46 PM

## 2010-12-21 NOTE — Medication Information (Signed)
Summary: rov/sp   Anticoagulant Therapy  Managed by: Weston Brass, PharmD Referring MD: Golden Circle PCP: Dr. Ferdinand Cava MD: Shirlee Latch MD, Freida Busman Indication 1: Atrial Fibrillation Lab Used: [facility] [place] Site: [address] INR POC 2.4 INR RANGE 2.0-3.0  Dietary changes: no       Details: ate some extra greens over the past 3 days  Health status changes: no    Bleeding/hemorrhagic complications: no    Recent/future hospitalizations: no    Any changes in medication regimen? yes       Details: changing from simvastatin to rosuvastatin  Recent/future dental: no  Any missed doses?: no       Is patient compliant with meds? yes       Allergies: 1)  Sulfamethoxazole (Sulfamethoxazole)  Anticoagulation Management History:      The patient is taking warfarin and comes in today for a routine follow up visit.  Positive risk factors for bleeding include an age of 75 years or older.  The bleeding index is 'intermediate risk'.  Positive CHADS2 values include History of CHF and History of HTN.  Negative CHADS2 values include Age > 42 years old.  Her last INR was 11.0 ratio.  Anticoagulation responsible Derwood Becraft: Shirlee Latch MD, [place].  INR POC: 2.4.  Cuvette Lot#: [id number].  Exp: 08/2011.    Anticoagulation Management Assessment/Plan:      The patient's current anticoagulation dose is Warfarin sodium 1 mg tabs: Take as directed by coumadin clinic..  The target INR is 2.0-3.0.  The next INR is due 08/28/2010.  Anticoagulation instructions were given to patient.  Results were reviewed/authorized by Weston Brass, PharmD.         Prior Anticoagulation Instructions: INR 2.0  Continue same dose of 2 tablets every day except 1 tablet on Monday, Wednesday and Friday.  Recheck INR in 4 weeks.   Current Anticoagulation Instructions: INR 2.4 Continue taking same dosages. No changes today. See you in 4 weeks.

## 2010-12-21 NOTE — Medication Information (Signed)
Summary: rov/ewj  Anticoagulant Therapy  Managed by: Weston Brass, PharmD Referring MD: Golden Circle PCP: Dr. Ferdinand Cava MD: Tenny Craw MD, Gunnar Fusi Indication 1: Atrial Fibrillation Lab Used: [facility] [place] Site: [address] INR POC 2.0 INR RANGE 2.0-3.0  Dietary changes: no    Health status changes: no    Bleeding/hemorrhagic complications: no    Recent/future hospitalizations: no    Any changes in medication regimen? no    Recent/future dental: no  Any missed doses?: no       Is patient compliant with meds? yes       Allergies: 1)  Sulfamethoxazole (Sulfamethoxazole)  Anticoagulation Management History:      The patient is taking warfarin and comes in today for a routine follow up visit.  Positive risk factors for bleeding include an age of 17 years or older.  The bleeding index is 'intermediate risk'.  Positive CHADS2 values include History of CHF and History of HTN.  Negative CHADS2 values include Age > 31 years old.  Her last INR was 11.0 ratio.  Anticoagulation responsible provider: Tenny Craw MD, Gunnar Fusi.  INR POC: 2.0.  Cuvette Lot#: [id number].  Exp: 08/2011.    Anticoagulation Management Assessment/Plan:      The patient's current anticoagulation dose is Warfarin sodium 1 mg tabs: Take as directed by coumadin clinic..  The target INR is 2.0-3.0.  The next INR is due 07/26/2010.  Anticoagulation instructions were given to patient.  Results were reviewed/authorized by Weston Brass, PharmD.  She was notified by Weston Brass PharmD.         Prior Anticoagulation Instructions: INR 2.2  Continue on same dosage 2 tablets daily except 1 tablet on Mondays, Wednesdays, and Fridays.  Recheck in 4 weeks.    Current Anticoagulation Instructions: INR 2.0  Continue same dose of 2 tablets every day except 1 tablet on Monday, Wednesday and Friday.  Recheck INR in 4 weeks.

## 2010-12-21 NOTE — Medication Information (Signed)
Summary: ROV/LB  Anticoagulant Therapy  Managed by: Cloyde Reams, RN, BSN Referring MD: D.Mclean PCP: Lupita Raider, MD Supervising MD: Myrtis Ser MD, Tinnie Gens Indication 1: Atrial Fibrillation Lab Used: [facility] [place] Site: [address] INR POC 2.2 INR RANGE 2.0-3.0  Dietary changes: no    Health status changes: no    Bleeding/hemorrhagic complications: no    Recent/future hospitalizations: no    Any changes in medication regimen? no    Recent/future dental: no  Any missed doses?: no       Is patient compliant with meds? yes       Allergies (verified): 1)  Sulfamethoxazole (Sulfamethoxazole)  Anticoagulation Management History:      The patient is taking warfarin and comes in today for a routine follow up visit.  Positive risk factors for bleeding include an age of 75 years or older.  The bleeding index is 'intermediate risk'.  Positive CHADS2 values include History of CHF and History of HTN.  Negative CHADS2 values include Age > 39 years old.  Her last INR was 11.0 ratio.  Anticoagulation responsible provider: Myrtis Ser MD, Tinnie Gens.  INR POC: 2.2.  Cuvette Lot#: [id number].  Exp: 02/2011.    Anticoagulation Management Assessment/Plan:      The patient's current anticoagulation dose is Warfarin sodium 1 mg tabs: Take as directed by coumadin clinic..  The target INR is 2.0-3.0.  The next INR is due 01/16/2010.  Anticoagulation instructions were given to patient.  Results were reviewed/authorized by Cloyde Reams, RN, BSN.  She was notified by Cloyde Reams RN.         Prior Anticoagulation Instructions: INR 2.1 CONTINUE TAKING 2 TABLETS EVERYDAY EXCEPT TAKE 1 TABLET ON MONDAYS, WEDNESDAYS, AND FRIDAYS.  Current Anticoagulation Instructions: INR 2.2  Continue on same dosage 2 tablets daily except 1 tablet on Mondays, Wednesdays and Fridays.  Recheck in 4 weeks.

## 2010-12-21 NOTE — Progress Notes (Signed)
Summary: pt needs refill called in been out since friday   Phone Note Refill Request Call back at Home Phone [phone] Message from:  Patient on [facility]  Refills Requested: Medication #1:  WARFARIN SODIUM 1 MG TABS Take as directed by coumadin clinic. pt has been out since friday  Initial call taken by: Omer Jack,  February 20, 2010 1:35 PM    Prescriptions: WARFARIN SODIUM 1 MG TABS (WARFARIN SODIUM) Take as directed by coumadin clinic.  #60 x 3   Entered by:   Weston Brass PharmD   Authorized by:   [name], MD   Signed by:   Weston Brass PharmD on 02/21/2010   Method used:   Telephoned to ...       [facility] (retail)       [address]       [place], [place]  [address]       Ph: [phone]       Fax: [phone]   RxID:   [id number]

## 2010-12-21 NOTE — Medication Information (Signed)
Summary: rov/sp  Anticoagulant Therapy  Managed by: Cloyde Reams, RN, BSN Referring MD: D.Mclean PCP: Dr. Ferdinand Cava MD: Tenny Craw MD, Gunnar Fusi Indication 1: Atrial Fibrillation Lab Used: [facility] [place] Site: [address] INR POC 2.4 INR RANGE 2.0-3.0  Dietary changes: no    Health status changes: no    Bleeding/hemorrhagic complications: no    Recent/future hospitalizations: no    Any changes in medication regimen? no    Recent/future dental: no  Any missed doses?: no       Is patient compliant with meds? yes       Allergies: 1)  Sulfamethoxazole (Sulfamethoxazole)  Anticoagulation Management History:      The patient is taking warfarin and comes in today for a routine follow up visit.  Positive risk factors for bleeding include an age of 75 years or older.  The bleeding index is 'intermediate risk'.  Positive CHADS2 values include History of CHF and History of HTN.  Negative CHADS2 values include Age > 12 years old.  Her last INR was 11.0 ratio.  Anticoagulation responsible Marci Polito: Tenny Craw MD, Gunnar Fusi.  INR POC: 2.4.  Cuvette Lot#: [id number].  Exp: 09/2011.    Anticoagulation Management Assessment/Plan:      The patient's current anticoagulation dose is Warfarin sodium 1 mg tabs: Take as directed by coumadin clinic..  The target INR is 2.0-3.0.  The next INR is due 09/25/2010.  Anticoagulation instructions were given to patient.  Results were reviewed/authorized by Cloyde Reams, RN, BSN.  She was notified by Cloyde Reams RN.         Prior Anticoagulation Instructions: INR 2.4 Continue taking same dosages. No changes today. See you in 4 weeks.  Current Anticoagulation Instructions: INR 2.4  Continue on same dosage 2 tablets daily except 1 tablet on Mondays, Wednesdays, and Fridays.  Recheck in 4 weeks.

## 2010-12-21 NOTE — Medication Information (Signed)
Summary: rov/sp  Anticoagulant Therapy  Managed by: Bethena Midget, RN, BSN Referring MD: D.Mclean PCP: Dr. Ferdinand Cava MD: Myrtis Ser MD, Tinnie Gens Indication 1: Atrial Fibrillation Lab Used: [facility] [place] Site: [address] INR POC 2.1 INR RANGE 2.0-3.0  Dietary changes: no    Health status changes: no    Bleeding/hemorrhagic complications: no    Recent/future hospitalizations: no    Any changes in medication regimen? no    Recent/future dental: no  Any missed doses?: no       Is patient compliant with meds? yes       Allergies: 1)  Sulfamethoxazole (Sulfamethoxazole)  Anticoagulation Management History:      The patient is taking warfarin and comes in today for a routine follow up visit.  Positive risk factors for bleeding include an age of 75 years or older.  The bleeding index is 'intermediate risk'.  Positive CHADS2 values include History of CHF and History of HTN.  Negative CHADS2 values include Age > 33 years old.  Her last INR was 11.0 ratio.  Anticoagulation responsible provider: Myrtis Ser MD, Tinnie Gens.  INR POC: 2.1.  Cuvette Lot#: [id number].  Exp: 06/2011.    Anticoagulation Management Assessment/Plan:      The patient's current anticoagulation dose is Warfarin sodium 1 mg tabs: Take as directed by coumadin clinic..  The target INR is 2.0-3.0.  The next INR is due 05/08/2010.  Anticoagulation instructions were given to patient.  Results were reviewed/authorized by Bethena Midget, RN, BSN.  She was notified by Bethena Midget, RN, BSN.         Prior Anticoagulation Instructions: INR 2.0  Continue same dose of 2 tablets every day except 1 tablet on Monday, Wednesday and Friday   Current Anticoagulation Instructions: INR 2.1 Continue 2 pill everyday except 1 pill on Mondays, Wednesdays and Fridays. Recheck in 4 weeks.

## 2010-12-21 NOTE — Assessment & Plan Note (Signed)
Summary: f81m  Medications Added CRESTOR 10 MG TABS (ROSUVASTATIN CALCIUM) 1 once daily        Visit Type:  Follow-up Primary Thurman Sarver:  Dr. Lelon Perla  CC:  NO CARDIAC  COMPLAINTS.  History of Present Illness: 77 yo with history of ascending aortic aneurysm, obesity, atrial fibrillation, and significant osteoarthritis presents for cardiology followup.  She has an ascending aortic aneurysm measuring 4.8 cm (stable size) that is being followed by Dr. Dorris Fetch.  Last CT was in 3/11.  She is living at home but has help.  She is sedentary and uses a walker to get around for stability.  She has pain in her knees, lower legs, and ankles.  She is morbidly obese.  She is not short of breath walking slowly with her walker but will become short of breath if she "rushes."  She does housework and cooks and gets mildly short of breath with heavier housework.  Denies orthopnea.  She gets very rare (every 4-5 months) episodes of mild chest pain ("pain all across my chest") if she overexerts herself.   She has been steady on her feet with no falls and is tolerating the coumadin ok.  She is in atrial fibrillation today.   ECG:  Atrial fibrillation, IVCD, rate 94  Labs (8/10): K 3.9, creatinine 1.5, BNP 100, LDL 68  Current Medications (verified): 1)  Toprol Xl 50 Mg Xr24h-Tab (Metoprolol Succinate) .... One and One-Half Tablets Daily 2)  Cozaar 100 Mg Tabs (Losartan Potassium) .... Take 1 Tablet Once A Day 3)  Ditropan Xl 5 Mg Tb24 (Oxybutynin Chloride) .... Take 1 Tablet By Mouth Once A Day 4)  Hydrochlorothiazide 25 Mg Tabs (Hydrochlorothiazide) .... Take 1 Tablet By Mouth Once A Day 5)  Simvastatin 80 Mg Tabs (Simvastatin) .Marland Kitchen.. 1 By Mouth At Bedtime 6)  Sm Calcium/vitamin D 500-200 Mg-Unit Tabs (Calcium Carbonate-Vitamin D) .... Take 1 Tablet By Mouth Twice A Day 7)  Tylenol 325 Mg Tabs (Acetaminophen) .... Take 2 Tablet By Mouth Four Times A Day As Needed 8)  Vitamin E 400 Unit Caps (Vitamin E) ....  Take 1-2 Daily 9)  Sertraline Hcl 100 Mg Tabs (Sertraline Hcl) .... One Daily 10)  Cobal-1000 1000 Mcg/ml Soln (Cyanocobalamin) .Marland Kitchen.. [place] Monthly. Dispense One Dose. 11)  Power Retail buyer .... As Directed. Dispense 1. 12)  Fish Oil  Oil (Fish Oil) .Marland Kitchen.. 1 Capsule Most Days 13)  Tramadol Hcl 100 Mg Xr24h-Tab (Tramadol Hcl) .... Take 1 Tab By Mouth Daily As Needed For Pain 14)  Voltaren 1 % Gel (Diclofenac Sodium) .... Apply To Hands Qid As Needed Pain 15)  Lidoderm 5 % Ptch (Lidocaine) .... Apply To Affected Area For Up To 12 Hours in A 24 Hour Period As Needed Pain 16)  Walker .... Dispense One 17)  Warfarin Sodium 1 Mg Tabs (Warfarin Sodium) .... Take As Directed By Coumadin Clinic.  Allergies: 1)  Sulfamethoxazole (Sulfamethoxazole)  Past History:  Past Medical History: 1. Obesity 2. Depression 3. HTN 4. Osteoarthritis with pain in multiple joints 5. Hyperlipidemia 6. CKD: creatinine 1.5 in 8/10 7. h/o PE: several years ago per patient. 8. Ascending aortic aneurysm: Followed by Dr. Dorris Fetch.  CT chest (3/11) with stable 4.8 cm ascending aortic aneurysm.   9. Echo (7/10): Technically difficult study.  Septal and inferolateral hypokinesis.  EF "moderately decreased." Mildly dilated ascending aorta.  Mild aortic insufficiency.  10.  Adenosine myoview (8/10): Done because of technically difficult echo with ? LV systolic dysfunction and wall  motion abnormalities.  This study showed normal EF 63% and normal wall motion.  No perfusion defect so no evidence of ischemia/infarction. 11.  Atrial fibrillation: Noted initially at adenosine myoview 8/10.  She seems to be in chronic atrial fibrillation at this point.   Family History: Reviewed history from 07/06/2009 and no changes required. Longevity.  No CVA, no CAD, no neurologic dz.  Breast Cancer in mother (34).  4 sisters alive and well. No h/o fracture  Social History: Reviewed history from 07/06/2009 and no changes  required. Lives alone.  Children live within 1 mile of her & very supportive and involved in her care.  No EtOH, no tobacco.  Widowed x 14 years.  Walks with walker.    Review of Systems       All systems reviewed and negative except as per HPI.   Vital Signs:  Patient profile:   77 year old female Height:      63.5 inches Weight:      235.12 pounds BMI:     41.14 Pulse rate:   72 / minute BP sitting:   122 / 78  (left arm) Cuff size:   regular  Vitals Entered By: Scherrie Bateman, LPN (August 01, 2010 8:15 AM)  Physical Exam  General:  Morbidly obese.  No acute distress Neck:  Neck supple, no JVD. No masses, thyromegaly or abnormal cervical nodes. Lungs:  Clear bilaterally to auscultation and percussion. Heart:  Non-displaced PMI, chest non-tender; irregular rate and rhythm, S1, S2 without murmurs, rubs or gallops. Carotid upstroke normal, no bruit.  Pedals normal pulses. Trace ankle edema bilaterally.  Abdomen:  Bowel sounds positive; abdomen soft and non-tender without masses, organomegaly, or hernias noted. No hepatosplenomegaly. Extremities:  No clubbing or cyanosis. Neurologic:  Alert and oriented x 3. Psych:  Normal affect.   Impression & Recommendations:  Problem # 1:  ATRIAL FIBRILLATION (ICD-427.31) Steady with walker, no falls.  Continue coumadin and Toprol XL.   Problem # 2:  ANEURYSM, THORACIC AORTIC (ICD-441.2) Stable in size.  Followed by Dr. Dorris Fetch.  Repeat CTA in 3/12.  Need to continue good blood pressure control.   Problem # 3:  HYPERLIPIDEMIA-MIXED (ICD-272.4) Patient has significant aches and pains in her lower limbs.  This may be osteoarthritis, but she is also taking simvastatin 80 mg daily, which has increased risk for muscle side effects.  I will stop simvastatin and put her on Crestor 10 mg daily.  Lipids/LFTs in 2 months.   Problem # 4:  CHF, EJECTION FRACTION > OR = 50% (ICD-428.32) Stable diastolic CHF.  Appears close to euvolemic.     Problem # 5:  HYPERTENSION, BENIGN SYSTEMIC (ICD-401.1) BP is well-controlled on current regimen.  Continue.   Other Orders: EKG w/ Interpretation (93000) TLB-BMP (Basic Metabolic Panel-BMET) (80048-METABOL) TLB-CBC Platelet - w/Differential (85025-CBCD) TLB-Hepatic/Liver Function Pnl (80076-HEPATIC)  Patient Instructions: 1)  Your physician recommends that you schedule a follow-up appointment in: 6 MONTHS WITH DR [facility] 2)  Your physician has recommended you make the following change in your medication: STOP SIMVASTATIN 3)  CRESTOR 10 MG  1 EVERY PM  4)  Your physician recommends that you return for lab work QI:ONGEX CBC BMET LIVER AND 2 MONTHS LIPID LIVER Prescriptions: CRESTOR 10 MG TABS (ROSUVASTATIN CALCIUM) 1 once daily  #30 x 6   Entered by:   Scherrie Bateman, LPN   Authorized by:   [name], MD   Signed by:   Scherrie Bateman, LPN on 52/84/1324  Method used:   Faxed to ...       [facility] (retail)       [address]       [place], [place]  [address]       Ph: [phone]       Fax: [phone]   RxID:   [id number]

## 2010-12-21 NOTE — Medication Information (Signed)
Summary: rov/ewj  Anticoagulant Therapy  Managed by: Cloyde Reams, RN, BSN Referring MD: D.Mclean PCP: Dr. Ferdinand Cava MD: Tenny Craw MD, Gunnar Fusi Indication 1: Atrial Fibrillation Lab Used: [facility] [place] Site: [address] INR POC 2.4 INR RANGE 2.0-3.0  Dietary changes: no    Health status changes: no    Bleeding/hemorrhagic complications: no    Recent/future hospitalizations: no    Any changes in medication regimen? no    Recent/future dental: no  Any missed doses?: no       Is patient compliant with meds? yes       Allergies (verified): 1)  Sulfamethoxazole (Sulfamethoxazole)  Anticoagulation Management History:      The patient is taking warfarin and comes in today for a routine follow up visit.  Positive risk factors for bleeding include an age of 75 years or older.  The bleeding index is 'intermediate risk'.  Positive CHADS2 values include History of CHF and History of HTN.  Negative CHADS2 values include Age > 75 years old.  Her last INR was 11.0 ratio.  Anticoagulation responsible provider: Tenny Craw MD, Gunnar Fusi.  INR POC: 2.4.  Cuvette Lot#: [id number].  Exp: 03/2011.    Anticoagulation Management Assessment/Plan:      The patient's current anticoagulation dose is Warfarin sodium 1 mg tabs: Take as directed by coumadin clinic..  The target INR is 2.0-3.0.  The next INR is due 02/13/2010.  Anticoagulation instructions were given to patient.  Results were reviewed/authorized by Cloyde Reams, RN, BSN.  She was notified by Cloyde Reams RN.         Prior Anticoagulation Instructions: INR 2.2  Continue on same dosage 2 tablets daily except 1 tablet on Mondays, Wednesdays and Fridays.  Recheck in 4 weeks.    Current Anticoagulation Instructions: INR 2.4  Continue on same dosage 2 tablets daily except 1 tablet on Mondays, Wednesdays, and Fridays.  Recheck in 4 weeks.

## 2010-12-21 NOTE — Letter (Signed)
Summary: Appointment - Reminder 2  [employer], Main Office  [address]   [place], [place] [address]   Phone: [phone]  Fax: [phone]     May 09, 2010 MRN: [id number]   [facility] [address] [place], [place]  [address]   Dear Ms. Todorov,  Our records indicate that it is time to schedule a follow-up appointment with Dr. Shirlee Latch in August. It is very important that we reach you to schedule this appointment. We look forward to participating in your health care needs. Please contact us at the number listed above at your earliest convenience to schedule your appointment.  If you are unable to make an appointment at this time, give [place] a call so we can update our records.     Sincerely,   Migdalia Dk [facility] Scheduling Team

## 2010-12-21 NOTE — Medication Information (Signed)
Summary: rov/tm  Anticoagulant Therapy  Managed by: Eda Keys, PharmD Referring MD: D.Mclean PCP: Lupita Raider, MD Supervising MD: Clifton James MD, Cristal Deer Indication 1: Atrial Fibrillation Lab Used: [facility] Site: [address] INR POC 1.9 INR RANGE 2.0-3.0  Dietary changes: yes       Details: heavy serving of cabbage yesterday  Health status changes: no    Bleeding/hemorrhagic complications: no    Recent/future hospitalizations: no    Any changes in medication regimen? no    Recent/future dental: no  Any missed doses?: no       Is patient compliant with meds? yes       Current Medications (verified): 1)  Toprol Xl 50 Mg Xr24h-Tab (Metoprolol Succinate) .Marland Kitchen.. 1 Daily 2)  Cozaar 100 Mg Tabs (Losartan Potassium) .... Take 1 Tablet Once A Day 3)  Ditropan Xl 5 Mg Tb24 (Oxybutynin Chloride) .... Take 1 Tablet By Mouth Once A Day 4)  Hydrochlorothiazide 25 Mg Tabs (Hydrochlorothiazide) .... Take 1 Tablet By Mouth Once A Day 5)  Simvastatin 80 Mg Tabs (Simvastatin) .Marland Kitchen.. 1 By Mouth At Bedtime 6)  Sm Calcium/vitamin D 500-200 Mg-Unit Tabs (Calcium Carbonate-Vitamin D) .... Take 1 Tablet By Mouth Twice A Day 7)  Tylenol 325 Mg Tabs (Acetaminophen) .... Take 2 Tablet By Mouth Four Times A Day As Needed 8)  Vitamin E 400 Unit Caps (Vitamin E) .... Take 1-2 Daily 9)  Sertraline Hcl 100 Mg Tabs (Sertraline Hcl) .... One Daily 10)  Cobal-1000 1000 Mcg/ml Soln (Cyanocobalamin) .Marland Kitchen.. [place] Monthly. Dispense One Dose. 11)  Power Retail buyer .... As Directed. Dispense 1. 12)  Fish Oil  Oil (Fish Oil) .Marland Kitchen.. 1 Capsule Most Days 13)  Tramadol Hcl 50 Mg  Tabs (Tramadol Hcl) .... 1/2 Tab To Whole Tablet Two Times A Day As Needed Pain 14)  Voltaren 1 % Gel (Diclofenac Sodium) .... Apply To Hands Qid As Needed Pain 15)  Lidoderm 5 % Ptch (Lidocaine) .... Apply To Affected Area For Up To 12 Hours in A 24 Hour Period As Needed Pain 16)  Walker .... Dispense One 17)  Warfarin  Sodium 1 Mg Tabs (Warfarin Sodium) .... Take As Directed By Coumadin Clinic. 18)  Mephyton 5 Mg Tabs (Phytonadione) .... Take 1/2 Tablet Today  Allergies (verified): 1)  Sulfamethoxazole (Sulfamethoxazole)  Anticoagulation Management History:      The patient is taking warfarin and comes in today for a routine follow up visit.  Positive risk factors for bleeding include an age of 75 years or older.  The bleeding index is 'intermediate risk'.  Positive CHADS2 values include History of CHF and History of HTN.  Negative CHADS2 values include Age > 75 years old.  Her last INR was 11.0 ratio.  Anticoagulation responsible provider: Clifton James MD, Cristal Deer.  INR POC: 1.9.  Cuvette Lot#: [id number].  Exp: 09/2010.    Anticoagulation Management Assessment/Plan:      The patient's current anticoagulation dose is Warfarin sodium 1 mg tabs: Take as directed by coumadin clinic..  The next INR is due 08/29/2009.  Anticoagulation instructions were given to patient.  Results were reviewed/authorized by Eda Keys, PharmD.  She was notified by Eda Keys.         Prior Anticoagulation Instructions: INR 2.1 Take 1 tablet everyday except on Tuesdays, Thursdays, and Saturdays take 2 tablets.   Current Anticoagulation Instructions: INR 1.9  Take 1.5 tablets today. Then return to normal dosing schedule of 2 tablets on Tuesday, Thursday, and Saturday, and 1 tablet  all other days.

## 2010-12-21 NOTE — Medication Information (Signed)
Summary: rov/ewj  Anticoagulant Therapy  Managed by: Eda Keys, PharmD Referring MD: D.Mclean PCP: Dr. Ferdinand Cava MD: Tenny Craw MD, Gunnar Fusi Indication 1: Atrial Fibrillation Lab Used: [facility] [place] Site: [address] INR POC 1.7 INR RANGE 2.0-3.0  Dietary changes: no    Health status changes: no    Bleeding/hemorrhagic complications: no    Recent/future hospitalizations: no    Any changes in medication regimen? no    Recent/future dental: no  Any missed doses?: no       Is patient compliant with meds? yes       Allergies: 1)  Sulfamethoxazole (Sulfamethoxazole)  Anticoagulation Management History:      The patient is taking warfarin and comes in today for a routine follow up visit.  Positive risk factors for bleeding include an age of 75 years or older.  The bleeding index is 'intermediate risk'.  Positive CHADS2 values include History of CHF and History of HTN.  Negative CHADS2 values include Age > 75 years old.  Her last INR was 11.0 ratio.  Anticoagulation responsible provider: Tenny Craw MD, Gunnar Fusi.  INR POC: 1.7.  Cuvette Lot#: [id number].  Exp: 03/2011.    Anticoagulation Management Assessment/Plan:      The patient's current anticoagulation dose is Warfarin sodium 1 mg tabs: Take as directed by coumadin clinic..  The target INR is 2.0-3.0.  The next INR is due 03/13/2010.  Anticoagulation instructions were given to patient.  Results were reviewed/authorized by Eda Keys, PharmD.  She was notified by Eda Keys.         Prior Anticoagulation Instructions: INR 2.4  Continue on same dosage 2 tablets daily except 1 tablet on Mondays, Wednesdays, and Fridays.  Recheck in 4 weeks.    Current Anticoagulation Instructions: INR 1.7  Take 2 tablets today.  Then restart normal dosing schedule of 1 tablet on Monday, Wednesday, and friday, and 2 tablets all other days.  Return to clinic in 4 weeks.

## 2010-12-21 NOTE — Miscellaneous (Signed)
Summary: insurance & tramadol   Clinical Lists Changes insurance will not pay for the 100mg  ER formulation two times a day. they will pay for 200mg  ER daily. do you want to change this or use a prior auth?Marland KitchenGolden Circle RN  April 20, 2010 9:18 AM

## 2010-12-21 NOTE — Assessment & Plan Note (Signed)
Summary: med check/eo   Vital Signs:  Patient profile:   77 year old female Height:      63.5 inches Weight:      237.8 pounds BMI:     41.61 Temp:     98.1 degrees F oral Pulse rate:   84 / minute BP sitting:   153 / 96  (right arm) Cuff size:   large  Vitals Entered By: Garen Grams LPN (December 12, 2009 9:01 AM) CC: Meet New MD, review meds Is Patient Diabetic? No Pain Assessment Patient in pain? no        Primary Care Provider:  Lupita Raider, MD  CC:  Meet New MD and review meds.  History of Present Illness: 1. HTN: Pt is taking / tolerating medicines as prescribed.  She does not check her BP regularly.         ROS: She denies any chest pain, shortness of breath, vision changes        PMHx: Hx of MI  2. Arthritis:  Takes Ultram as needed.  Seems to be getting a little bit worse.  More bothersome in her hands.        ROS: denies any joing redness or effusion  3. Hypertriglyceridemia: She is taking her cholesterol medicine as prescribed.  Her lipids were last checked in August.        ROS: denies claudication  4. Anemia: She takes  B12 injections for this.  Has not been taking it for months.          ROS: endorses more fatigue  Habits & Providers  Alcohol-Tobacco-Diet     Tobacco Status: never  Current Medications (verified): 1)  Toprol Xl 50 Mg Xr24h-Tab (Metoprolol Succinate) .Marland Kitchen.. 1 Daily 2)  Cozaar 100 Mg Tabs (Losartan Potassium) .... Take 1 Tablet Once A Day 3)  Ditropan Xl 5 Mg Tb24 (Oxybutynin Chloride) .... Take 1 Tablet By Mouth Once A Day 4)  Hydrochlorothiazide 25 Mg Tabs (Hydrochlorothiazide) .... Take 1 Tablet By Mouth Once A Day 5)  Simvastatin 80 Mg Tabs (Simvastatin) .Marland Kitchen.. 1 By Mouth At Bedtime 6)  Sm Calcium/vitamin D 500-200 Mg-Unit Tabs (Calcium Carbonate-Vitamin D) .... Take 1 Tablet By Mouth Twice A Day 7)  Tylenol 325 Mg Tabs (Acetaminophen) .... Take 2 Tablet By Mouth Four Times A Day As Needed 8)  Vitamin E 400 Unit Caps (Vitamin E)  .... Take 1-2 Daily 9)  Sertraline Hcl 100 Mg Tabs (Sertraline Hcl) .... One Daily 10)  Cobal-1000 1000 Mcg/ml Soln (Cyanocobalamin) .Marland Kitchen.. [place] Monthly. Dispense One Dose. 11)  Power Retail buyer .... As Directed. Dispense 1. 12)  Fish Oil  Oil (Fish Oil) .Marland Kitchen.. 1 Capsule Most Days 13)  Tramadol Hcl 50 Mg  Tabs (Tramadol Hcl) .... 1/2 Tab To Whole Tablet Two Times A Day As Needed Pain 14)  Voltaren 1 % Gel (Diclofenac Sodium) .... Apply To Hands Qid As Needed Pain 15)  Lidoderm 5 % Ptch (Lidocaine) .... Apply To Affected Area For Up To 12 Hours in A 24 Hour Period As Needed Pain 16)  Walker .... Dispense One 17)  Warfarin Sodium 1 Mg Tabs (Warfarin Sodium) .... Take As Directed By Coumadin Clinic. 18)  Mephyton 5 Mg Tabs (Phytonadione) .... Take 1/2 Tablet Today  Allergies: 1)  Sulfamethoxazole (Sulfamethoxazole)  Past History:  Past Medical History: Reviewed history from 07/21/2009 and no changes required. 1. Obesity 2. Depression 3. HTN 4. Osteoarthritis with pain in multiple joints 5. Hyperlipidemia 6. CKD: creatinine  1.5 in 8/10 7. h/o PE: several years ago per patient. 8. Ascending aortic aneurysm: Followed by Dr. Dorris Fetch.  CT chest (2/10) with stable 5 cm ascending aortic aneurysm.  Echo 7/10 with "mildly dilated" ascending aorta.   9. Echo (7/10): Technically difficult study.  Septal and inferolateral hypokinesis.  EF "moderately decreased." Mildly dilated ascending aorta.  Mild aortic insufficiency.  10.  Adenosine myoview (8/10): Done because of technically difficult echo with ? LV systolic dysfunction and wall motion abnormalities.  This study showed normal EF 63% and normal wall motion.  No perfusion defect so no evidence of ischemia/infarction. 11.  Atrial fibrillation: Noted initially at adenosine myoview 8/10.  She was still in atrial fibrillation at followup appt today.    Social History: Reviewed history from 07/06/2009 and no changes required. Lives alone.   Children live within 1 mile of her & very supportive and involved in her care.  No EtOH, no tobacco.  Widowed x 14 years.  Walks with walker.    Physical Exam  General:  Vitals reviewed.  Obese WF sitting in chair with walker. Head:  normocephalic and atraumatic.   Eyes:  PERRL, EOMI Mouth:  MMM Lungs:  Clear bilaterally to auscultation and percussion. Heart:  Non-displaced PMI, chest non-tender; irregular rate and rhythm, S1, S2 without rubs or gallops. 1/6 SEM.  Carotid upstroke normal, no bruit. Pedals normal pulses. No edema.  Abdomen:  Bowel sounds positive; abdomen soft and non-tender without masses, organomegaly, or hernias noted. No hepatosplenomegaly.  Obese.  Extremities:  No clubbing or cyanosis.  2+ edema Psych:  Normal affect.   Impression & Recommendations:  Problem # 1:  HYPERTENSION, BENIGN SYSTEMIC (ICD-401.1) Assessment Unchanged  Remains slightly elevated.  Not interested in medication change at this point.  Will continue to monitor. Her updated medication list for this problem includes:    Toprol Xl 50 Mg Xr24h-tab (Metoprolol succinate) .Marland Kitchen... 1 daily    Cozaar 100 Mg Tabs (Losartan potassium) .Marland Kitchen... Take 1 tablet once a day    Hydrochlorothiazide 25 Mg Tabs (Hydrochlorothiazide) .Marland Kitchen... Take 1 tablet by mouth once a day  Orders: [facility]- Est  Level 4 (54098)  Problem # 2:  ANEMIA NOS (ICD-285.9) Assessment: Unchanged  Restarted B12 injections Her updated medication list for this problem includes:    Cobal-1000 1000 Mcg/ml Soln (Cyanocobalamin) .Marland KitchenMarland KitchenMarland KitchenMarland Kitchen [place] monthly. dispense one dose.  Orders: [facility]- Est  Level 4 (99214)  Problem # 3:  OSTEOARTHRITIS, MULTI SITES (ICD-715.98) Assessment: Deteriorated  continue pain control Her updated medication list for this problem includes:    Tylenol 325 Mg Tabs (Acetaminophen) .Marland Kitchen... Take 2 tablet by mouth four times a day as needed    Tramadol Hcl 50 Mg Tabs (Tramadol hcl) .Marland Kitchen... 1/2 tab to whole tablet two times a day  as needed pain  Orders: [facility]- Est  Level 4 (11914)  Problem # 4:  HYPERTRIGLYCERIDEMIA (ICD-272.1) Assessment: Unchanged  Will recheck lipids after 1 year from last check. Her updated medication list for this problem includes:    Simvastatin 80 Mg Tabs (Simvastatin) .Marland Kitchen... 1 by mouth at bedtime  Orders: [facility]- Est  Level 4 (99214)  Complete Medication List: 1)  Toprol Xl 50 Mg Xr24h-tab (Metoprolol succinate) .Marland Kitchen.. 1 daily 2)  Cozaar 100 Mg Tabs (Losartan potassium) .... Take 1 tablet once a day 3)  Ditropan Xl 5 Mg Tb24 (Oxybutynin chloride) .... Take 1 tablet by mouth once a day 4)  Hydrochlorothiazide 25 Mg Tabs (Hydrochlorothiazide) .... Take 1 tablet by  mouth once a day 5)  Simvastatin 80 Mg Tabs (Simvastatin) .Marland Kitchen.. 1 by mouth at bedtime 6)  Sm Calcium/vitamin D 500-200 Mg-unit Tabs (Calcium carbonate-vitamin d) .... Take 1 tablet by mouth twice a day 7)  Tylenol 325 Mg Tabs (Acetaminophen) .... Take 2 tablet by mouth four times a day as needed 8)  Vitamin E 400 Unit Caps (Vitamin e) .... Take 1-2 daily 9)  Sertraline Hcl 100 Mg Tabs (Sertraline hcl) .... One daily 10)  Cobal-1000 1000 Mcg/ml Soln (Cyanocobalamin) .Marland Kitchen.. [place] monthly. dispense one dose. 11)  Power Retail buyer  .... As directed. dispense 1. 12)  Fish Oil Oil (Fish oil) .Marland Kitchen.. 1 capsule most days 13)  Tramadol Hcl 50 Mg Tabs (Tramadol hcl) .... 1/2 tab to whole tablet two times a day as needed pain 14)  Voltaren 1 % Gel (Diclofenac sodium) .... Apply to hands qid as needed pain 15)  Lidoderm 5 % Ptch (Lidocaine) .... Apply to affected area for up to 12 hours in a 24 hour period as needed pain 16)  Walker  .... Dispense one 17)  Warfarin Sodium 1 Mg Tabs (Warfarin sodium) .... Take as directed by coumadin clinic. 18)  Mephyton 5 Mg Tabs (Phytonadione) .... Take 1/2 tablet today  Patient Instructions: 1)  It was very nice meeting you. 2)  I look forward to taking over your care 3)  I have provided a prescription for  the B12 injections 4)  Please schedule an appt in 3 months to follow up on your blood pressure Prescriptions: COBAL-1000 1000 MCG/ML SOLN (CYANOCOBALAMIN) [place] monthly. dispense one dose.  #1 x 11   Entered and Authorized by:   Angelena Sole MD   Signed by:   Angelena Sole MD on 12/12/2009   Method used:   Print then Give to Patient   RxID:   [id number]   Prevention & Chronic Care Immunizations   Influenza vaccine: Fluvax 3+  (08/25/2008)   Influenza vaccine due: 08/25/2009    Tetanus booster: 02/17/2005: Done.   Tetanus booster due: 02/18/2015    Pneumococcal vaccine: Pneumovax  (08/25/2008)   Pneumococcal vaccine due: None    H. zoster vaccine: 08/25/2008: Refused due to cost  Colorectal Screening   Hemoccult: Done.  (06/19/2000)   Hemoccult due: Not Indicated    Colonoscopy: Done.  (06/20/1999)   Colonoscopy due: 06/19/2009  Other Screening   Pap smear: normal  (02/02/2009)   Pap smear due: Not Indicated    Mammogram: ASSESSMENT: Negative - BI-RADS 1^MM DIGITAL SCREENING  (05/16/2009)   Mammogram due: 04/27/2009    DXA bone density scan: normal  (04/27/2008)   DXA scan due: None    Smoking status: never  (12/12/2009)  Lipids   Total Cholesterol: 134  (07/11/2009)   LDL: 68  (07/11/2009)   LDL Direct: 107  (11/23/2008)   HDL: 36.50  (07/11/2009)   Triglycerides: 149.0  (07/11/2009)    SGOT (AST): 23  (07/11/2009)   SGPT (ALT): 12  (07/11/2009)   Alkaline phosphatase: 53  (07/11/2009)   Total bilirubin: 0.8  (07/11/2009)    Lipid flowsheet reviewed?: Yes   Progress toward LDL goal: At goal  Hypertension   Last Blood Pressure: 153 / 96  (12/12/2009)   Serum creatinine: 1.5  (07/11/2009)   Serum potassium 3.9  (07/11/2009)    Hypertension flowsheet reviewed?: Yes   Progress toward BP goal: Unchanged  Self-Management Support :    Hypertension self-management support: Not documented    Lipid self-management  support: Not documented

## 2010-12-21 NOTE — Progress Notes (Signed)
Summary: phn msg   Phone Note Other Incoming   Caller: [facility] Summary of Call: Just wanted Dr. Lelon Perla know that pt got her last B12 inj on the 14th of June. Will call pt and tell her to schedule with [place] for July. Initial call taken by: Clydell Hakim,  May 18, 2010 1:55 PM  Follow-up for Phone Call        pt called and sched appt for July 14th Follow-up by: De Nurse,  May 18, 2010 2:12 PM

## 2010-12-21 NOTE — Progress Notes (Signed)
Summary: Rx Req   Phone Note Call from Patient Call back at Home Phone [phone]   Caller: Patient Summary of Call: Trying to get her comudin filled and having trouble getting it due to how many she is taking. Initial call taken by: Clydell Hakim,  February 20, 2010 10:39 AM  Follow-up for Phone Call        spoke with patient . Dr. Shirlee Latch follows  protime for patient . told her to call the cardiologist office to get this taken care of for her. gave her the phone number. Follow-up by: Theresia Lo RN,  February 20, 2010 1:33 PM

## 2010-12-21 NOTE — Assessment & Plan Note (Signed)
Summary: mobility eval,df   Vital Signs:  Patient profile:   77 year old female Height:      63.5 inches Weight:      238 pounds BMI:     41.65 Temp:     98.0 degrees F oral BP sitting:   104 / 64  (left arm)  Vitals Entered By: Tessie Fass CMA (June 01, 2010 8:45 AM) CC: monility eval Is Patient Diabetic? No   Primary Care Provider:  Dr. Lelon Perla  CC:  monility eval.  History of Present Illness: 1. Mobility eval for powered wheel chair.  She has been contacted by several companies to get a powered wheelchair.  It wasn't her idea in the first place.  However, she does think that it would help her to get around more.  Medical Issues 1. CHF -Taken her medicines as prescribed.  Followed by Dr. Shirlee Latch 2. Osteoarthritis -Takes tylenol as needed 3. Morbid Obesity -Gaining weight.  Not interested in losing weight 4. Pulmonary Hypertension -Does have shortness of breath but it is stable  ROS: denies fevers, chills, weight loss, vision problems, hearing problems.      Habits & Providers  Alcohol-Tobacco-Diet     Tobacco Status: never  Current Medications (verified): 1)  Toprol Xl 50 Mg Xr24h-Tab (Metoprolol Succinate) .... One and One-Half Tablets Daily 2)  Cozaar 100 Mg Tabs (Losartan Potassium) .... Take 1 Tablet Once A Day 3)  Ditropan Xl 5 Mg Tb24 (Oxybutynin Chloride) .... Take 1 Tablet By Mouth Once A Day 4)  Hydrochlorothiazide 25 Mg Tabs (Hydrochlorothiazide) .... Take 1 Tablet By Mouth Once A Day 5)  Simvastatin 80 Mg Tabs (Simvastatin) .Marland Kitchen.. 1 By Mouth At Bedtime 6)  Sm Calcium/vitamin D 500-200 Mg-Unit Tabs (Calcium Carbonate-Vitamin D) .... Take 1 Tablet By Mouth Twice A Day 7)  Tylenol 325 Mg Tabs (Acetaminophen) .... Take 2 Tablet By Mouth Four Times A Day As Needed 8)  Vitamin E 400 Unit Caps (Vitamin E) .... Take 1-2 Daily 9)  Sertraline Hcl 100 Mg Tabs (Sertraline Hcl) .... One Daily 10)  Cobal-1000 1000 Mcg/ml Soln (Cyanocobalamin) .Marland Kitchen.. [place]  Monthly. Dispense One Dose. 11)  Power Retail buyer .... As Directed. Dispense 1. 12)  Fish Oil  Oil (Fish Oil) .Marland Kitchen.. 1 Capsule Most Days 13)  Tramadol Hcl 100 Mg Xr24h-Tab (Tramadol Hcl) .... Take 1 Tab By Mouth Daily As Needed For Pain 14)  Voltaren 1 % Gel (Diclofenac Sodium) .... Apply To Hands Qid As Needed Pain 15)  Lidoderm 5 % Ptch (Lidocaine) .... Apply To Affected Area For Up To 12 Hours in A 24 Hour Period As Needed Pain 16)  Walker .... Dispense One 17)  Warfarin Sodium 1 Mg Tabs (Warfarin Sodium) .... Take As Directed By Coumadin Clinic. 18)  Mephyton 5 Mg Tabs (Phytonadione) .... Take 1/2 Tablet Today and Save The Other Half Until Needed  Allergies: 1)  Sulfamethoxazole (Sulfamethoxazole)  Past History:  Past Medical History: Reviewed history from 07/21/2009 and no changes required. 1. Obesity 2. Depression 3. HTN 4. Osteoarthritis with pain in multiple joints 5. Hyperlipidemia 6. CKD: creatinine 1.5 in 8/10 7. h/o PE: several years ago per patient. 8. Ascending aortic aneurysm: Followed by Dr. Dorris Fetch.  CT chest (2/10) with stable 5 cm ascending aortic aneurysm.  Echo 7/10 with "mildly dilated" ascending aorta.   9. Echo (7/10): Technically difficult study.  Septal and inferolateral hypokinesis.  EF "moderately decreased." Mildly dilated ascending aorta.  Mild aortic insufficiency.  10.  Adenosine  myoview (8/10): Done because of technically difficult echo with ? LV systolic dysfunction and wall motion abnormalities.  This study showed normal EF 63% and normal wall motion.  No perfusion defect so no evidence of ischemia/infarction. 11.  Atrial fibrillation: Noted initially at adenosine myoview 8/10.  She was still in atrial fibrillation at followup appt today.    Social History: Reviewed history from 07/06/2009 and no changes required. Lives alone.  Children live within 1 mile of her & very supportive and involved in her care.  No EtOH, no tobacco.  Widowed x 14 years.   Walks with walker.    Physical Exam  General:  Vitals reviewed.  Morbidly obese.  No acute distress Eyes:  vision grossly intact.  PERRL.  Fundoscopic exam normal. Ears:  R ear normal and L ear normal.   Mouth:  Dentures.  OP pink and moist Neck:  supple and no masses.  Decreased ROM Lungs:  Clear bilaterally to auscultation and percussion.  Normal work of breathing.  No crackles or wheezes. Heart:  normal rate, regular rhythm, and no murmur.   Abdomen:  soft, non-tender, and normal bowel sounds.  Obese. Msk:  Knees: bilateral joint line tenderness, non swollen, not red.  Ankles:  nonswollen, non-TTP. Extremities:  No clubbing or cyanosis.  Trace edema.  Chronic venous stasis changes. Neurologic:  alert & oriented X3, cranial nerves II-XII intact, strength normal in all extremities, sensation intact to light touch.  Antalgic gait favoring left leg.  Walks with both feet turned right.  Walks with a walker. Psych:  Oriented X3, memory intact for recent and remote, normally interactive, good eye contact, not anxious appearing, and not depressed appearing.   Additional Exam:  1. Functional Assessment: -Walks with a walker -No assistance with transfers -No wheelchair use -Is incontinent of urine, wears pull ups -No assistance with bathing (uses sponge baths, showers, cannot take baths) -No assistance with dressing -No assistance with toileting (uses a bedside commode as needed) -No assistance with feeding (sometimes has people help her make meals)  2. Geriatric Depression Scale:  No positives  3. Geriatric Function Screen: -Registers all 3 words -No Hearing problems -No vision problems -Has trouble with stairs -Had trouble with falls in the past but she has made changes to keep from falling -She can get out of bed, dress, and make her own meals.  She cannot do her own shopping -Good social support -Has not lost weight -Is incontinent of urine -Not depressed -Can't touch back of  her heads with her hands -Can't rise from chair without assistance -Good health self-report  4. MMSE: 30/30   Impression & Recommendations:  Problem # 1:  WEAKNESS (ICD-780.79) Assessment New  From deconditioning.  Here for mobility eval for powered wheelchair.  She is able to perform her ADLs.  She is able to get around with the use of a walker.  She does not qualify for a powered wheelchair.  Went over exercises with pt. to help build up leg, hip, core strength.  She doesn't appear that motivated so unsure if she will do any of these.  Already taking Vitamin D.  Orders: [facility]- Est  Level 4 ([address])  Problem # 2:  CHF (ICD-428.0) Assessment: Unchanged Taking medicines as prescribed.  Followed by Dr. Shirlee Latch.   Her updated medication list for this problem includes:    Toprol Xl 50 Mg Xr24h-tab (Metoprolol succinate) ..... One and one-half tablets daily    Cozaar 100 Mg Tabs (Losartan potassium) .Marland KitchenMarland KitchenMarland KitchenMarland Kitchen  Take 1 tablet once a day    Hydrochlorothiazide 25 Mg Tabs (Hydrochlorothiazide) .Marland Kitchen... Take 1 tablet by mouth once a day    Warfarin Sodium 1 Mg Tabs (Warfarin sodium) .Marland Kitchen... Take as directed by coumadin clinic.  Problem # 3:  OBESITY, NOS (ICD-278.00) Assessment: Unchanged Discussed with patient importance of weight loss.  Discussed dietary changes.  Problem # 4:  OSTEOARTHRITIS, MULTI SITES (ICD-715.98) Assessment: Unchanged  Osteoarthritis of knees, hips, ankles.  She only takes Tylenol / Tramadol as needed.  She doesn't want to make these scheduled. Her updated medication list for this problem includes:    Tylenol 325 Mg Tabs (Acetaminophen) .Marland Kitchen... Take 2 tablet by mouth four times a day as needed    Tramadol Hcl 100 Mg Xr24h-tab (Tramadol hcl) .Marland Kitchen... Take 1 tab by mouth daily as needed for pain  Orders: [facility]- Est  Level 4 (99214)  Complete Medication List: 1)  Toprol Xl 50 Mg Xr24h-tab (Metoprolol succinate) .... One and one-half tablets daily 2)  Cozaar 100 Mg Tabs (Losartan  potassium) .... Take 1 tablet once a day 3)  Ditropan Xl 5 Mg Tb24 (Oxybutynin chloride) .... Take 1 tablet by mouth once a day 4)  Hydrochlorothiazide 25 Mg Tabs (Hydrochlorothiazide) .... Take 1 tablet by mouth once a day 5)  Simvastatin 80 Mg Tabs (Simvastatin) .Marland Kitchen.. 1 by mouth at bedtime 6)  Sm Calcium/vitamin D 500-200 Mg-unit Tabs (Calcium carbonate-vitamin d) .... Take 1 tablet by mouth twice a day 7)  Tylenol 325 Mg Tabs (Acetaminophen) .... Take 2 tablet by mouth four times a day as needed 8)  Vitamin E 400 Unit Caps (Vitamin e) .... Take 1-2 daily 9)  Sertraline Hcl 100 Mg Tabs (Sertraline hcl) .... One daily 10)  Cobal-1000 1000 Mcg/ml Soln (Cyanocobalamin) .Marland Kitchen.. [place] monthly. dispense one dose. 11)  Power Retail buyer  .... As directed. dispense 1. 12)  Fish Oil Oil (Fish oil) .Marland Kitchen.. 1 capsule most days 13)  Tramadol Hcl 100 Mg Xr24h-tab (Tramadol hcl) .... Take 1 tab by mouth daily as needed for pain 14)  Voltaren 1 % Gel (Diclofenac sodium) .... Apply to hands qid as needed pain 15)  Lidoderm 5 % Ptch (Lidocaine) .... Apply to affected area for up to 12 hours in a 24 hour period as needed pain 16)  Walker  .... Dispense one 17)  Warfarin Sodium 1 Mg Tabs (Warfarin sodium) .... Take as directed by coumadin clinic. 18)  Mephyton 5 Mg Tabs (Phytonadione) .... Take 1/2 tablet today and save the other half until needed  Other Orders: Admin of Therapeutic Inj  intramuscular or subcutaneous ([address])  Patient Instructions: 1)  It is in your best interest to not be put in a powered wheel chair. 2)  It is important for you to maintain your strength by getting up, walking around, and doing those exercises we gave you. 3)  Please schedule a follow up appointment in 2 months.   Medication Administration  Injection # 1:    Medication: Vit B12 1000 mcg    Diagnosis: B12 DEFICIENCY (ICD-266.2)    Route: IM    Site: L deltoid    Exp Date: 02/18/2012    Lot #: [id number]    Mfr: [employer]    Patient tolerated injection without complications    Given by: Garen Grams LPN (June 01, 2010 10:06 AM)  Orders Added: 1)  Admin of Therapeutic Inj  intramuscular or subcutaneous [96372] 2)  [facility]- Est  Level 4 [  99214]     Prevention & Chronic Care Immunizations   Influenza vaccine: Fluvax 3+  (08/25/2008)   Influenza vaccine due: 08/25/2009    Tetanus booster: 02/17/2005: Done.   Tetanus booster due: 02/18/2015    Pneumococcal vaccine: Pneumovax  (08/25/2008)   Pneumococcal vaccine due: None    H. zoster vaccine: 08/25/2008: Refused due to cost  Colorectal Screening   Hemoccult: Done.  (06/19/2000)   Hemoccult due: Not Indicated    Colonoscopy: Done.  (06/20/1999)   Colonoscopy due: 06/19/2009  Other Screening   Pap smear: normal  (02/02/2009)   Pap smear due: Not Indicated    Mammogram: ASSESSMENT: Negative - BI-RADS 1^MM DIGITAL SCREENING  (05/16/2009)   Mammogram due: 04/27/2009    DXA bone density scan: normal  (04/27/2008)   DXA scan due: None    Smoking status: never  (06/01/2010)  Lipids   Total Cholesterol: 134  (07/11/2009)   LDL: 68  (07/11/2009)   LDL Direct: 107  (11/23/2008)   HDL: 36.50  (07/11/2009)   Triglycerides: 149.0  (07/11/2009)    SGOT (AST): 23  (07/11/2009)   SGPT (ALT): 12  (07/11/2009)   Alkaline phosphatase: 53  (07/11/2009)   Total bilirubin: 0.8  (07/11/2009)  Hypertension   Last Blood Pressure: 104 / 64  (06/01/2010)   Serum creatinine: 1.5  (07/11/2009)   Serum potassium 3.9  (07/11/2009)  Self-Management Support :   Personal Goals (by the next clinic visit) :      Personal blood pressure goal: 140/90  (03/13/2010)     Personal LDL goal: 100  (03/13/2010)    Hypertension self-management support: Not documented    Lipid self-management support: Not documented

## 2010-12-21 NOTE — Progress Notes (Signed)
Summary: Refill   Phone Note Refill Request Call back at Home Phone [phone]   Refills Requested: Medication #1:  TRAMADOL HCL 50 MG  TABS 1/2 tab to whole tablet two times a day as needed pain  Medication #2:  LIDODERM 5 % PTCH apply to affected area for up to 12 hours in a 24 hour period as needed pain pt goes to [facility]  Initial call taken by: Knox Royalty,  January 23, 2010 9:54 AM    Prescriptions: LIDODERM 5 % PTCH (LIDOCAINE) apply to affected area for up to 12 hours in a 24 hour period as needed pain  #1 x 1   Entered and Authorized by:   Angelena Sole MD   Signed by:   Angelena Sole MD on 01/23/2010   Method used:   Print then Give to Patient   RxID:   [id number] TRAMADOL HCL 50 MG  TABS (TRAMADOL HCL) 1/2 tab to whole tablet two times a day as needed pain  #60 x 3   Entered and Authorized by:   Angelena Sole MD   Signed by:   Angelena Sole MD on 01/23/2010   Method used:   Print then Give to Patient   RxID:   [id number]

## 2010-12-21 NOTE — Miscellaneous (Signed)
Summary: continue the B12?   Clinical Lists Changes Laura Griffith with [facility] wants orders to continue the B12 if md desires. [phone].Golden Circle RN  June 29, 2010 3:49 PM  Fine to continue, we will check a level at her next office visit Angelena Sole MD  June 29, 2010 3:51 PM   called Arline Asp at [facility] & told her md said to continue.Golden Circle RN  June 29, 2010 4:35 PM

## 2010-12-21 NOTE — Medication Information (Signed)
Summary: rov/ewj  Anticoagulant Therapy  Managed by: Bethena Midget, RN, BSN Referring MD: Golden Circle PCP: Dr. Ferdinand Cava MD: Nahser MD Indication 1: Atrial Fibrillation Lab Used: [facility] [place] Site: [address] INR POC 2.1 INR RANGE 2.0-3.0  Dietary changes: no    Health status changes: no    Bleeding/hemorrhagic complications: no    Recent/future hospitalizations: no    Any changes in medication regimen? no    Recent/future dental: no  Any missed doses?: no       Is patient compliant with meds? yes       Allergies: 1)  Sulfamethoxazole (Sulfamethoxazole)  Anticoagulation Management History:      The patient is taking warfarin and comes in today for a routine follow up visit.  Positive risk factors for bleeding include an age of 75 years or older.  The bleeding index is 'intermediate risk'.  Positive CHADS2 values include History of CHF and History of HTN.  Negative CHADS2 values include Age > 75 years old.  Her last INR was 11.0 ratio.  Anticoagulation responsible provider: Nahser MD.  INR POC: 2.1.  Cuvette Lot#: [id number].  Exp: 09/2011.    Anticoagulation Management Assessment/Plan:      The patient's current anticoagulation dose is Warfarin sodium 1 mg tabs: Take as directed by coumadin clinic..  The target INR is 2.0-3.0.  The next INR is due 10/23/2010.  Anticoagulation instructions were given to patient.  Results were reviewed/authorized by Bethena Midget, RN, BSN.  She was notified by Bethena Midget, RN, BSN.         Prior Anticoagulation Instructions: INR 2.4  Continue on same dosage 2 tablets daily except 1 tablet on Mondays, Wednesdays, and Fridays.  Recheck in 4 weeks.   Current Anticoagulation Instructions: INR 2.1 Continue 2mg s everyday except 1mg s on Mondays, Wednesdays and Fridays. Recheck in 4 weeks.

## 2010-12-21 NOTE — Progress Notes (Signed)
Summary: refill   Phone Note Refill Request Call back at Home Phone [phone] Message from:  Patient  Refills Requested: Medication #1:  COZAAR 100 MG TABS Take 1 tablet once a day  Medication #2:  HYDROCHLOROTHIAZIDE 25 MG TABS Take 1 tablet by mouth once a day  Medication #3:  SIMVASTATIN 80 MG TABS 1 by mouth at bedtime   Dosage confirmed as above?Dosage Confirmed  Medication #4:  DITROPAN XL 5 MG TB24 Take 1 tablet by mouth once a day Initial call taken by: De Nurse,  November 22, 2009 11:43 AM  Follow-up for Phone Call        to pcp Follow-up by: Golden Circle RN,  November 22, 2009 11:46 AM  Additional Follow-up for Phone Call Additional follow up Details #1::        Rxs ready to be picked up at front Additional Follow-up by: Angelena Sole MD,  November 23, 2009 9:04 AM    Prescriptions: SIMVASTATIN 80 MG TABS (SIMVASTATIN) 1 by mouth at bedtime  #90 x 3   Entered by:   Golden Circle RN   Authorized by:   Angelena Sole MD   Signed by:   Golden Circle RN on 11/23/2009   Method used:   Printed then faxed to ...       [facility] (retail)       [address]       [place], [place]  [address]       Ph: [phone]       Fax: [phone]   RxID:   [id number] HYDROCHLOROTHIAZIDE 25 MG TABS (HYDROCHLOROTHIAZIDE) Take 1 tablet by mouth once a day  #30 x 11   Entered by:   Golden Circle RN   Authorized by:   Angelena Sole MD   Signed by:   Golden Circle RN on 11/23/2009   Method used:   Printed then faxed to ...       [facility] (retail)       [address]       [place], [place]  [address]       Ph: [phone]       Fax: [phone]   RxID:   [id number] DITROPAN XL 5 MG TB24 (OXYBUTYNIN CHLORIDE) Take 1 tablet by mouth once a day  #30 x 11   Entered by:   Golden Circle RN   Authorized by:   Angelena Sole MD   Signed by:   Golden Circle RN on 11/23/2009   Method used:    Printed then faxed to ...       [facility] (retail)       [address]       [place], [place]  [address]       Ph: [phone]       Fax: [phone]   RxID:   [id number] COZAAR 100 MG TABS (LOSARTAN POTASSIUM) Take 1 tablet once a day  #30 x 11   Entered by:   Golden Circle RN   Authorized by:   Angelena Sole MD   Signed by:   Golden Circle RN on 11/23/2009   Method used:   Printed then faxed to ...       [facility] (retail)       [address]  [place], [place]  [address]       Ph: [phone]       Fax: [phone]   RxID:   [id number] SIMVASTATIN 80 MG TABS (SIMVASTATIN) 1 by mouth at bedtime  #90 x 3   Entered and Authorized by:   Angelena Sole MD   Signed by:   Angelena Sole MD on 11/23/2009   Method used:   Print then Give to Patient   RxID:   [id number] HYDROCHLOROTHIAZIDE 25 MG TABS (HYDROCHLOROTHIAZIDE) Take 1 tablet by mouth once a day  #30 x 11   Entered and Authorized by:   Angelena Sole MD   Signed by:   Angelena Sole MD on 11/23/2009   Method used:   Print then Give to Patient   RxID:   [id number] DITROPAN XL 5 MG TB24 (OXYBUTYNIN CHLORIDE) Take 1 tablet by mouth once a day  #30 x 11   Entered and Authorized by:   Angelena Sole MD   Signed by:   Angelena Sole MD on 11/23/2009   Method used:   Print then Give to Patient   RxID:   [id number] COZAAR 100 MG TABS (LOSARTAN POTASSIUM) Take 1 tablet once a day  #30 x 11   Entered and Authorized by:   Angelena Sole MD   Signed by:   Angelena Sole MD on 11/23/2009   Method used:   Print then Give to Patient   RxID:   [id number]  faxed to pharmacy per her request..Sally [facility] RN  November 23, 2009 10:42 AM

## 2010-12-21 NOTE — Medication Information (Signed)
Summary: rov/tm  Anticoagulant Therapy  Managed by: Cloyde Reams, RN, BSN Referring MD: D.Mclean PCP: Dr. Ferdinand Cava MD: Shirlee Latch MD, [place] Indication 1: Atrial Fibrillation Lab Used: [facility] [place] Site: [address] INR POC 1.8 INR RANGE 2.0-3.0  Dietary changes: no    Health status changes: no    Bleeding/hemorrhagic complications: no    Recent/future hospitalizations: no    Any changes in medication regimen? no    Recent/future dental: no  Any missed doses?: no       Is patient compliant with meds? yes       Allergies: 1)  Sulfamethoxazole (Sulfamethoxazole)  Anticoagulation Management History:      The patient is taking warfarin and comes in today for a routine follow up visit.  Positive risk factors for bleeding include an age of 75 years or older.  The bleeding index is 'intermediate risk'.  Positive CHADS2 values include History of CHF and History of HTN.  Negative CHADS2 values include Age > 75 years old.  Her last INR was 11.0 ratio.  Anticoagulation responsible provider: Shirlee Latch MD, [place].  INR POC: 1.8.  Cuvette Lot#: [id number].  Exp: 07/2011.    Anticoagulation Management Assessment/Plan:      The patient's current anticoagulation dose is Warfarin sodium 1 mg tabs: Take as directed by coumadin clinic..  The target INR is 2.0-3.0.  The next INR is due 05/29/2010.  Anticoagulation instructions were given to patient.  Results were reviewed/authorized by Cloyde Reams, RN, BSN.  She was notified by Cloyde Reams RN.         Prior Anticoagulation Instructions: INR 2.1 Continue 2 pill everyday except 1 pill on Mondays, Wednesdays and Fridays. Recheck in 4 weeks.   Current Anticoagulation Instructions: INR 1.8  Take 2 tablets today, then resume same dosage 2 tablets daily except 1 tablet on Mondays, Wednesdays, and Fridays.  Recheck in 3 weeks.

## 2010-12-21 NOTE — Assessment & Plan Note (Signed)
Summary: f/u,df   Vital Signs:  Patient profile:   77 year old female Height:      63.5 inches Weight:      234.5 pounds BMI:     41.04 Temp:     98.1 degrees F oral Pulse rate:   76 / minute BP sitting:   145 / 81  (left arm) Cuff size:   large  Vitals Entered By: Garen Grams LPN (October 10, 2010 10:00 AM) CC: f/u bp, arthritis Is Patient Diabetic? No Pain Assessment Patient in pain? yes     Location: foot and leg   Primary Care Provider:  Dr. Lelon Perla  CC:  f/u bp and arthritis.  History of Present Illness: 1. Arthritis:  she is having bad pain in her legs.  The worse pain is in her left knee.  This pain has been getting worse.  She has had arthritis in both knees.  She had the right knee replaced and it is pretty good but it still gives her problems at times.  She is taking the Tramadol everyday.  It doesn't adequately control the pain at all times.  ROS: denies fevers, leg giving out or locking  2. HTN:  Pt is taking her medicines as prescribed.  She doesn't check her blood pressure at home regularly.  ROS: denies chest pain, shortness of breath  3. HLD:  Pt is taking her medicine as prescribed.  She is not watching her diet.  Still eating a lot of carbs and fats.  Habits & Providers  Alcohol-Tobacco-Diet     Tobacco Status: never  Current Medications (verified): 1)  Toprol Xl 50 Mg Xr24h-Tab (Metoprolol Succinate) .... One and One-Half Tablets Daily 2)  Cozaar 100 Mg Tabs (Losartan Potassium) .... Take 1 Tablet Once A Day 3)  Ditropan Xl 5 Mg Tb24 (Oxybutynin Chloride) .... Take 1 Tablet By Mouth Once A Day 4)  Hydrochlorothiazide 25 Mg Tabs (Hydrochlorothiazide) .... Take 1 Tablet By Mouth Once A Day 5)  Crestor 10 Mg Tabs (Rosuvastatin Calcium) .Marland Kitchen.. 1 Once Daily 6)  Sm Calcium/vitamin D 500-200 Mg-Unit Tabs (Calcium Carbonate-Vitamin D) .... Take 1 Tablet By Mouth Twice A Day 7)  Cobal-1000 1000 Mcg/ml Soln (Cyanocobalamin) .Marland Kitchen.. [place] Monthly. Dispense  One Dose. 8)  Tramadol Hcl 200 Mg Xr24h-Tab (Tramadol Hcl) .Marland Kitchen.. 1 Tab By Mouth Daily For Arthritis 9)  Warfarin Sodium 1 Mg Tabs (Warfarin Sodium) .... Take As Directed By Coumadin Clinic. 10)  Tylenol Arthritis Pain 650 Mg Cr-Tabs (Acetaminophen) .Marland Kitchen.. 1 Tab By Mouth Twice A Day  Allergies: 1)  Sulfamethoxazole (Sulfamethoxazole)  Social History: Reviewed history from 07/06/2009 and no changes required. Lives alone.  Children live within 1 mile of her & very supportive and involved in her care.  No EtOH, no tobacco.  Widowed x 14 years.  Walks with walker.    Physical Exam  General:  Vitals reviewed.  Morbidly obese.  No acute distress Eyes:  vision grossly intact.  PERRL.  Fundoscopic exam normal. Neck:  supple and no masses.  Decreased ROM Lungs:  Clear bilaterally to auscultation and percussion.  Normal work of breathing.  No crackles or wheezes. Heart:  normal rate, regular rhythm, and no murmur.   Msk:  Knees: bilateral joint line tenderness L>R, non swollen, not red.  Ankles:  nonswollen, non-TTP. Extremities:  No clubbing or cyanosis.  Trace edema.  Chronic venous stasis changes. Psych:  Oriented X3, memory intact for recent and remote, normally interactive, good eye contact, not anxious  appearing, and not depressed appearing.     Impression & Recommendations:  Problem # 1:  OSTEOARTHRITIS, MULTI SITES (ICD-715.98) Assessment Deteriorated  Not adequately controlled with daily Tramadol.  Will also add in Tylenol arthritis twice daily.  If this doesn't control the pain pt may need steroid injections. The following medications were removed from the medication list:    Tylenol 325 Mg Tabs (Acetaminophen) .Marland Kitchen... Take 2 tablet by mouth four times a day as needed Her updated medication list for this problem includes:    Tramadol Hcl 200 Mg Xr24h-tab (Tramadol hcl) .Marland Kitchen... 1 tab by mouth daily for arthritis    Tylenol Arthritis Pain 650 Mg Cr-tabs (Acetaminophen) .Marland Kitchen... 1 tab by mouth  twice a day  Orders: [facility]- Est  Level 4 ([address])  Problem # 2:  HYPERTENSION, BENIGN SYSTEMIC (ICD-401.1) Assessment: Unchanged Pt taking her medicines as prescribed.  BP not at goal but close.  Will continue to monitor for now. Her updated medication list for this problem includes:    Toprol Xl 50 Mg Xr24h-tab (Metoprolol succinate) ..... One and one-half tablets daily    Cozaar 100 Mg Tabs (Losartan potassium) .Marland Kitchen... Take 1 tablet once a day    Hydrochlorothiazide 25 Mg Tabs (Hydrochlorothiazide) .Marland Kitchen... Take 1 tablet by mouth once a day  Orders: Comp Met-[facility] (60454-09811) [facility]- Est  Level 4 (91478)  Problem # 3:  HYPERLIPIDEMIA-MIXED (ICD-272.4) Assessment: Unchanged Check Lipids today.  Adjust medicines as needed. Her updated medication list for this problem includes:    Crestor 10 Mg Tabs (Rosuvastatin calcium) .Marland Kitchen... 1 once daily  Orders: Lipid-[facility] (29562-13086) [facility]- Est  Level 4 (57846)  Problem # 4:  B12 DEFICIENCY (ICD-266.2) Assessment: Unchanged Check B12 level today. Orders: B12-[facility] (96295-28413) [facility]- Est  Level 4 (24401)  Complete Medication List: 1)  Toprol Xl 50 Mg Xr24h-tab (Metoprolol succinate) .... One and one-half tablets daily 2)  Cozaar 100 Mg Tabs (Losartan potassium) .... Take 1 tablet once a day 3)  Ditropan Xl 5 Mg Tb24 (Oxybutynin chloride) .... Take 1 tablet by mouth once a day 4)  Hydrochlorothiazide 25 Mg Tabs (Hydrochlorothiazide) .... Take 1 tablet by mouth once a day 5)  Crestor 10 Mg Tabs (Rosuvastatin calcium) .Marland Kitchen.. 1 once daily 6)  Sm Calcium/vitamin D 500-200 Mg-unit Tabs (Calcium carbonate-vitamin d) .... Take 1 tablet by mouth twice a day 7)  Cobal-1000 1000 Mcg/ml Soln (Cyanocobalamin) .Marland Kitchen.. [place] monthly. dispense one dose. 8)  Tramadol Hcl 200 Mg Xr24h-tab (Tramadol hcl) .Marland Kitchen.. 1 tab by mouth daily for arthritis 9)  Warfarin Sodium 1 Mg Tabs (Warfarin sodium) .... Take as directed by coumadin clinic. 10)  Tylenol Arthritis Pain 650 Mg  Cr-tabs (Acetaminophen) .Marland Kitchen.. 1 tab by mouth twice a day  Other Orders: CBC-[facility] (02725) Flu Vaccine 25yrs + (36644) Admin 1st Vaccine (03474)  Patient Instructions: 1)  Start taking Tylenol CR 650mg  twice a day a long with the Tramadol everyday for your arthritis pain 2)  We will get some blood work today and I will let you know about the results 3)  Please schedule a follow up appointment with me in 3 months Prescriptions: TRAMADOL HCL 200 MG XR24H-TAB (TRAMADOL HCL) 1 tab by mouth daily for arthritis  #90 x 3   Entered and Authorized by:   Angelena Sole MD   Signed by:   Angelena Sole MD on 10/10/2010   Method used:   Faxed to ...       [facility] (retail)  [address]       [place], [place]  [address]       Ph: [phone]       Fax: [phone]   RxID:   [id number] TYLENOL ARTHRITIS PAIN 650 MG CR-TABS (ACETAMINOPHEN) 1 tab by mouth twice a day  #180 x 3   Entered and Authorized by:   Angelena Sole MD   Signed by:   Angelena Sole MD on 10/10/2010   Method used:   Faxed to ...       [facility] (retail)       [address]       [place], [place]  [address]       Ph: [phone]       Fax: [phone]   RxID:   [id number] TRAMADOL HCL 100 MG XR24H-TAB (TRAMADOL HCL) Take 1 tab by mouth daily as needed for pain  #90 x 3   Entered and Authorized by:   Angelena Sole MD   Signed by:   Angelena Sole MD on 10/10/2010   Method used:   Faxed to ...       [facility] (retail)       [address]       [place], [place]  [address]       Ph: [phone]       Fax: [phone]   RxID:   [id number] TRAMADOL HCL 100 MG XR24H-TAB (TRAMADOL HCL) Take 1 tab by mouth daily as needed for pain  #90 x 3   Entered and Authorized by:   Angelena Sole MD   Signed by:   Angelena Sole MD on 10/10/2010   Method used:   Print then Give to Patient   RxID:    [id number] TYLENOL ARTHRITIS PAIN 650 MG CR-TABS (ACETAMINOPHEN) 1 tab by mouth twice a day  #180 x 3   Entered and Authorized by:   Angelena Sole MD   Signed by:   Angelena Sole MD on 10/10/2010   Method used:   Print then Give to Patient   RxID:   [id number]    Orders Added: 1)  CBC-[facility] [85027] 2)  Comp Met-[facility] [67124-58099] 3)  Lipid-[facility] [80061-22930] 4)  B12-[facility] [83382-50539] 5)  Flu Vaccine 14yrs + [76734] 6)  Admin 1st Vaccine [90471] 7)  [facility]- Est  Level 4 [19379]   Flu Vaccine Consent Questions:    Do you have a history of severe allergic reactions to this vaccine? no    Any prior history of allergic reactions to egg and/or gelatin? no    Do you have a sensitivity to the preservative Thimersol? no    Do you have a past history of Guillan-Barre Syndrome? no    Do you currently have an acute febrile illness? no    Have you ever had a severe reaction to latex? no    Vaccine information given and explained to patient? yes    Are you currently pregnant? no   Influenza Vaccine (to be given today)

## 2010-12-21 NOTE — Letter (Signed)
Summary: Generic Letter  [facility]  [address]   [place], [place] [address]   Phone: [phone]  Fax: [phone]    12/12/2010  [address] [place], [place]  [address]  Dear Ms. Hengel,  We are happy to let you know that since you are covered under Medicare you are able to have a FREE visit at the [facility] to discuss your HEALTH. This is a new benefit for Medicare.  There will be no co-payment.  At this visit you will meet with Arlys John an expert in wellness and the health coach at our clinic.  At this visit we will discuss ways to keep you healthy and feeling well.  This visit will not replace your regular doctor visit and we cannot refill medications.     You will need to plan to be here at least one hour to talk about your medical history, your current status, review all of your medications, and discuss your future plans for your health.  This information will be entered into your record for your doctor to have and review.  If you are interested in staying healthy, this type of visit can help.  Please call the office at: [phone], to schedule a "Medicare Wellness Visit".  The day of the visit you should bring in all of your medications, including any vitamins, herbs, over the counter products you take.  Make a list of all the other doctors that you see, so we know who they are. If you have any other health documents please bring them.  We look forward to helping you stay healthy.  Sincerely,   [facility]  iAWV

## 2010-12-21 NOTE — Progress Notes (Signed)
Summary: B12   Phone Note Other Incoming Call back at [phone]   Call For: [phone] Summary of Call: Tresa Endo from [facility] need verbal orders to continue giving Ms. Mansel her b12 injections. Initial call taken by: Abundio Miu,  August 29, 2010 10:26 AM  Follow-up for Phone Call        That is fine.  Please have her come in to have her B12 checked. Follow-up by: Angelena Sole MD,  August 30, 2010 9:30 AM  Additional Follow-up for Phone Call Additional follow up Details #1::        Tresa Endo informed, she will tell patient to call and schedule an appt. Additional Follow-up by: Garen Grams LPN,  August 30, 2010 9:36 AM

## 2010-12-21 NOTE — Letter (Signed)
Summary: Generic Letter  [facility]  [address]   [place], [place] [address]   Phone: [phone]  Fax: [phone]    10/11/2010  [facility] [address] [place], [place]  [address]  Dear Ms. Chao,  Here is a copy of your lab results.  There were a couple minor abnormalities but nothing to be concerned about.  Overall they look good.  You kidney function, liver function, electrolytes, blood counts, cholesterol, and vitamin B12 were all normal.  Tests: (1) CMP with Estimated GFR (2402)   Sodium                    142 mEq/L                   135-145   Potassium                 4.4 mEq/L                   3.5-5.3   Chloride                  104 mEq/L                   96-112   CO2                       26 mEq/L                    19-32   Glucose              [H]  101 mg/dL                   57-84   BUN                  [H]  42 mg/dL                    6-96   Creatinine                1.20 mg/dL                  0.40-1.20   Bilirubin, Total          0.5 mg/dL                   2.9-5.2   Alkaline Phosphatase      53 U/L                      39-117   AST/SGOT                  18 U/L                      0-37   ALT/SGPT                  9 U/L                       0-35   Total Protein             7.2 g/dL                    8.4-1.3   Albumin  4.2 g/dL                    8.1-1.9   Calcium                   10.1 mg/dL                  1.4-78.2 ! Est GFR, African American                        [L]  53 mL/min                   >60 ! Est GFR, NonAfrican American                        [L]  44 mL/min                   >60  Tests: (2) CBC NO Diff (Complete Blood Count) (10000)   WBC                       6.8 K/uL                    4.0-10.5   RBC                       4.60 MIL/uL                 3.87-5.11   Hemoglobin                14.8 g/dL                   95.6-21.3   Hematocrit           [H]  46.2 %                      36.0-46.0   MCV                   [H]  100.4 fL                    78.0-100.0 ! MCH                       32.2 pg                     26.0-34.0   MCHC                      32.0 g/dL                   08.6-57.8   RDW                       14.1 %                      11.5-15.5   Platelet Count       [L]  135 K/uL                    150-400  Tests: (3) Lipid Profile (46962)   Cholesterol               156 mg/dL  0-200     ATP III Classification:           < 200        mg/dL        Desirable          200 - 239     mg/dL        Borderline High          >= 240        mg/dL        High         Triglyceride         [H]  228 mg/dL                   <045   HDL Cholesterol           44 mg/dL                    >40   Total Chol/HDL Ratio      3.5 Ratio  VLDL Cholesterol (Calc)                        [H]  46 mg/dL                    9-81  LDL Cholesterol (Calc)                             66 mg/dL                    1-91           Total Cholesterol/HDL Ratio:CHD Risk                            Coronary Heart Disease Risk Table                                            Men       Women              1/2 Average Risk              3.4        3.3                  Average Risk              5.0        4.4              2 X Average Risk              9.6        7.1              3 X Average Risk             23.4       11.0     Use the calculated Patient Ratio above and the CHD Risk table      to determine the patient's CHD Risk.     ATP III Classification (LDL):           < 100        mg/dL         Optimal  100 - 129     mg/dL         Near or Above Optimal          130 - 159     mg/dL         Borderline High          160 - 189     mg/dL         High           > 190        mg/dL         Very High        Tests: (4) Vitamin B12 ([address])   Vitamin B12               641 pg/mL                   211-911    Sincerely,   Angelena Sole MD  Appended Document: Generic Letter mailed

## 2010-12-21 NOTE — Progress Notes (Signed)
Summary: phn msg   Phone Note Other Incoming Call back at [phone]   Caller: [facility] Summary of Call: Here ins has changed.  Can she get her B12 inj here at the office.  Home Care will still cover her incontinence supplies. Initial call taken by: Clydell Hakim,  May 17, 2010 10:38 AM  Follow-up for Phone Call        I'm not sure.  Do you know if we give B12 injections? Follow-up by: Angelena Sole MD,  May 17, 2010 12:35 PM  Additional Follow-up for Phone Call Additional follow up Details #1::        called Tresa Endo back & told her we can do this. pt is to bring her bottle with her to appts & we will administer it. pt to call for appt when next due Additional Follow-up by: Golden Circle RN,  May 17, 2010 3:27 PM

## 2010-12-21 NOTE — Assessment & Plan Note (Signed)
Summary: mobility eval,df  Pt nor MD has form for power wheelchair.  Pt unaware of company name.  Will call back this afternoon with name of company. Pt not seen, no charge............................................... Shanda Bumps [facility] May 19, 2010 11:08 AM   Vital Signs:  Patient profile:   77 year old female Height:      63.5 inches Weight:      237 pounds BMI:     41.47 BSA:     2.09 Temp:     98.5 degrees F Pulse rate:   97 / minute BP sitting:   135 / 80  Vitals Entered By: Jone Baseman CMA (May 19, 2010 10:39 AM) CC: mobility eval Is Patient Diabetic? No Pain Assessment Patient in pain? no        CC:  mobility eval.  Habits & Providers  Alcohol-Tobacco-Diet     Tobacco Status: never  Allergies: 1)  Sulfamethoxazole (Sulfamethoxazole)   Complete Medication List: 1)  Toprol Xl 50 Mg Xr24h-tab (Metoprolol succinate) .... One and one-half tablets daily 2)  Cozaar 100 Mg Tabs (Losartan potassium) .... Take 1 tablet once a day 3)  Ditropan Xl 5 Mg Tb24 (Oxybutynin chloride) .... Take 1 tablet by mouth once a day 4)  Hydrochlorothiazide 25 Mg Tabs (Hydrochlorothiazide) .... Take 1 tablet by mouth once a day 5)  Simvastatin 80 Mg Tabs (Simvastatin) .Marland Kitchen.. 1 by mouth at bedtime 6)  Sm Calcium/vitamin D 500-200 Mg-unit Tabs (Calcium carbonate-vitamin d) .... Take 1 tablet by mouth twice a day 7)  Tylenol 325 Mg Tabs (Acetaminophen) .... Take 2 tablet by mouth four times a day as needed 8)  Vitamin E 400 Unit Caps (Vitamin e) .... Take 1-2 daily 9)  Sertraline Hcl 100 Mg Tabs (Sertraline hcl) .... One daily 10)  Cobal-1000 1000 Mcg/ml Soln (Cyanocobalamin) .Marland Kitchen.. [place] monthly. dispense one dose. 11)  Power [employer]  .... As directed. dispense 1. 12)  Fish Oil Oil (Fish oil) .Marland Kitchen.. 1 capsule most days 13)  Tramadol Hcl 100 Mg Xr24h-tab (Tramadol hcl) .... Take 1 tab by mouth daily as needed for pain 14)  Voltaren 1 % Gel (Diclofenac sodium) .... Apply to hands  qid as needed pain 15)  Lidoderm 5 % Ptch (Lidocaine) .... Apply to affected area for up to 12 hours in a 24 hour period as needed pain 16)  Walker  .... Dispense one 17)  Warfarin Sodium 1 Mg Tabs (Warfarin sodium) .... Take as directed by coumadin clinic. 18)  Mephyton 5 Mg Tabs (Phytonadione) .... Take 1/2 tablet today and save the other half until needed  Other Orders: No Charge Patient Arrived (NCPA0) (NCPA0)

## 2010-12-21 NOTE — Progress Notes (Signed)
   Phone Note Call from Patient   Caller: [facility] Call For: [phone] Summary of Call: Need recert order for continuing services monthly for b15 injections and incontinence supplies Initial call taken by: Abundio Miu,  October 30, 2010 4:05 PM  Follow-up for Phone Call        okay to continue B12 injections and incontinence supplies Follow-up by: Angelena Sole MD,  November 01, 2010 8:58 AM

## 2010-12-21 NOTE — Assessment & Plan Note (Signed)
Summary: 4 month rov pt daugter wanted to bring her end of dec/sl  Medications Added TOPROL XL 50 MG XR24H-TAB (METOPROLOL SUCCINATE) one and one-half tablets daily MEPHYTON 5 MG TABS (PHYTONADIONE) Take 1/2 tablet today and save the other half until needed      Allergies Added:   Visit Type:  Follow-up Primary Provider:  Dr. Lelon Perla  CC:  chest  pain and sob about the same.  History of Present Illness: 77 yo with history of ascending aortic aneurysm, obesity, significant osteoarthritis presents for cardiology followup.  She has an ascending aortic aneurysm measuring 5 cm (stable size) that is being followed by Dr. Dorris Fetch.  Last CT was in 2/10.  She is living at home but has help.  She is sedentary and uses a walker to get around.  She is morbidly obese.  She is not short of breath walking slowly with her walker but will become short of breath if she "rushes."  She does housework and cooks and gets mildly short of breath with heavier housework.  Denies orthopnea.  She gets very rare (every 4-5 months) episodes of mild chest pain ("pain all across my chest") if she overexerts herself.   She has been steady on her feet with no falls and is tolerating the coumadin ok.   ECG:  Atrial fibrillation, IVCD, rate 100  Labs (8/10): K 3.9, creatinine 1.5, BNP 100, LDL 68  Current Medications (verified): 1)  Toprol Xl 50 Mg Xr24h-Tab (Metoprolol Succinate) .... One and One-Half Tablets Daily 2)  Cozaar 100 Mg Tabs (Losartan Potassium) .... Take 1 Tablet Once A Day 3)  Ditropan Xl 5 Mg Tb24 (Oxybutynin Chloride) .... Take 1 Tablet By Mouth Once A Day 4)  Hydrochlorothiazide 25 Mg Tabs (Hydrochlorothiazide) .... Take 1 Tablet By Mouth Once A Day 5)  Simvastatin 80 Mg Tabs (Simvastatin) .Marland Kitchen.. 1 By Mouth At Bedtime 6)  Sm Calcium/vitamin D 500-200 Mg-Unit Tabs (Calcium Carbonate-Vitamin D) .... Take 1 Tablet By Mouth Twice A Day 7)  Tylenol 325 Mg Tabs (Acetaminophen) .... Take 2 Tablet By Mouth  Four Times A Day As Needed 8)  Vitamin E 400 Unit Caps (Vitamin E) .... Take 1-2 Daily 9)  Sertraline Hcl 100 Mg Tabs (Sertraline Hcl) .... One Daily 10)  Cobal-1000 1000 Mcg/ml Soln (Cyanocobalamin) .Marland Kitchen.. [place] Monthly. Dispense One Dose. 11)  Power Retail buyer .... As Directed. Dispense 1. 12)  Fish Oil  Oil (Fish Oil) .Marland Kitchen.. 1 Capsule Most Days 13)  Tramadol Hcl 50 Mg  Tabs (Tramadol Hcl) .... 1/2 Tab To Whole Tablet Two Times A Day As Needed Pain 14)  Voltaren 1 % Gel (Diclofenac Sodium) .... Apply To Hands Qid As Needed Pain 15)  Lidoderm 5 % Ptch (Lidocaine) .... Apply To Affected Area For Up To 12 Hours in A 24 Hour Period As Needed Pain 16)  Walker .... Dispense One 17)  Warfarin Sodium 1 Mg Tabs (Warfarin Sodium) .... Take As Directed By Coumadin Clinic. 18)  Mephyton 5 Mg Tabs (Phytonadione) .... Take 1/2 Tablet Today and Save The Other Half Until Needed  Allergies (verified): 1)  Sulfamethoxazole (Sulfamethoxazole)  Past History:  Past Medical History: Last updated: 07/21/2009 1. Obesity 2. Depression 3. HTN 4. Osteoarthritis with pain in multiple joints 5. Hyperlipidemia 6. CKD: creatinine 1.5 in 8/10 7. h/o PE: several years ago per patient. 8. Ascending aortic aneurysm: Followed by Dr. Dorris Fetch.  CT chest (2/10) with stable 5 cm ascending aortic aneurysm.  Echo 7/10 with "mildly  dilated" ascending aorta.   9. Echo (7/10): Technically difficult study.  Septal and inferolateral hypokinesis.  EF "moderately decreased." Mildly dilated ascending aorta.  Mild aortic insufficiency.  10.  Adenosine myoview (8/10): Done because of technically difficult echo with ? LV systolic dysfunction and wall motion abnormalities.  This study showed normal EF 63% and normal wall motion.  No perfusion defect so no evidence of ischemia/infarction. 11.  Atrial fibrillation: Noted initially at adenosine myoview 8/10.  She was still in atrial fibrillation at followup appt today.    Family  History: Reviewed history from 07/06/2009 and no changes required. Longevity.  No CVA, no CAD, no neurologic dz.  Breast Cancer in mother (11).  4 sisters alive and well. No h/o fracture  Social History: Reviewed history from 07/06/2009 and no changes required. Lives alone.  Children live within 1 mile of her & very supportive and involved in her care.  No EtOH, no tobacco.  Widowed x 14 years.  Walks with walker.    Vital Signs:  Patient profile:   77 year old female Height:      63.5 inches Weight:      237 pounds Pulse rate:   100 / minute BP sitting:   117 / 77  (left arm) Cuff size:   large  Vitals Entered By: Burnett Kanaris, CNA (December 19, 2009 9:19 AM)  Physical Exam  General:  Obese WF sitting in chair with walker. Neck:  Neck supple, JVP does not appear elevated. No masses, thyromegaly or abnormal cervical nodes. Lungs:  Clear bilaterally to auscultation and percussion. Heart:  Non-displaced PMI, chest non-tender; irregular rate and rhythm, S1, S2 without rubs or gallops. 1/6 SEM.  Carotid upstroke normal, no bruit. Pedals normal pulses. No edema.  Abdomen:  Bowel sounds positive; abdomen soft and non-tender without masses, organomegaly, or hernias noted. No hepatosplenomegaly. Extremities:  No clubbing or cyanosis. Neurologic:  Alert and oriented x 3. Psych:  Normal affect.   Impression & Recommendations:  Problem # 1:  ATRIAL FIBRILLATION (ICD-427.31) Steady with walker, no falls.  HR running in the 100s at rest.  Continue coumadin.  Increase Toprol XL to 75 mg daily.   Problem # 2:  CHF (ICD-428.0) EF "moderately decreased" on echo but this was a technically difficult study.  However, EF normal on myoview.  She is not significantly volume overloaded and BNP has not been significantly high.  No Lasix for now, continue HCTZ.  I think that her dyspnea may be due more to deconditioning and obesity.   Problem # 3:  ANEURYSM, THORACIC AORTIC (ICD-441.2) Followed by Dr.  Dorris Fetch.  Ascending aorta has been stable at 5.0 cm.  She would be a difficult surgical candidate given her deconditioning and lack of mobility.  Continue beta blocker and ARB as there is some data these may help prevent thoracic aneurysm progression.  If she does indeed go for surgery, would need closer view of aortic valve (not seen well on echo).  Her last imaging study appears to have been in 2/10. She is following up with Dr. Dorris Fetch in 3/11 and tells me that imaging has been ordered.   Followup in 6 months.   Patient Instructions: 1)  Your physician has recommended you make the following change in your medication:  2)  Increase Toprol XL  to 75mg  daily--this will be one and one-half of a 50mg  tablet 3)  Your physician wants you to follow-up in: 6 months with .dm   You will receive  a reminder letter in the mail two months in advance. If you don't receive a letter, please call our office to schedule the follow-up appointment. Prescriptions: TOPROL XL 50 MG XR24H-TAB (METOPROLOL SUCCINATE) one and one-half tablets daily  #50 x 6   Entered by:   Katina Dung, RN, BSN   Authorized by:   [name], MD   Signed by:   Katina Dung, RN, BSN on 12/19/2009   Method used:   Print then Give to Patient   RxID:   [id number] TOPROL XL 50 MG XR24H-TAB (METOPROLOL SUCCINATE) one and one-half tablets daily  #50 x 0   Entered by:   Katina Dung, RN, BSN   Authorized by:   [name], MD   Signed by:   Katina Dung, RN, BSN on 12/19/2009   Method used:   Print then Give to Patient   RxID:   [id number]

## 2010-12-21 NOTE — Progress Notes (Signed)
Summary: refill   Phone Note Refill Request Call back at Home Phone [phone] Message from:  Patient  Refills Requested: Medication #1:  WARFARIN SODIUM 1 MG TABS Take as directed by coumadin clinic.. Initial call taken by: De Nurse,  August 21, 2010 11:48 AM    Prescriptions: WARFARIN SODIUM 1 MG TABS (WARFARIN SODIUM) Take as directed by coumadin clinic.  #60 x 3   Entered and Authorized by:   Angelena Sole MD   Signed by:   Angelena Sole MD on 08/21/2010   Method used:   Faxed to ...       [facility] (retail)       [address]       [place], [place]  [address]       Ph: [phone]       Fax: [phone]   RxID:   [id number]

## 2010-12-21 NOTE — Miscellaneous (Signed)
   Clinical Lists Changes  Problems: Removed problem of WEAKNESS (ICD-780.79) Removed problem of OTHER DYSPNEA AND RESPIRATORY ABNORMALITIES (ICD-786.09) Removed problem of CHEST PAIN UNSPECIFIED (ICD-786.50) Removed problem of HYPERTRIGLYCERIDEMIA (ICD-272.1) Removed problem of OSTEOPENIA (ICD-733.90)

## 2010-12-21 NOTE — Medication Information (Signed)
Summary: rov/tm  Anticoagulant Therapy  Managed by: Bethena Midget, RN, BSN Referring MD: D.Mclean PCP: Dr. Ferdinand Cava MD: Eden Emms MD, Theron Arista Indication 1: Atrial Fibrillation Lab Used: [facility] [place] Site: [address] INR POC 2.9 INR RANGE 2.0-3.0  Dietary changes: no    Health status changes: no    Bleeding/hemorrhagic complications: no    Recent/future hospitalizations: no    Any changes in medication regimen? no    Recent/future dental: no  Any missed doses?: no       Is patient compliant with meds? yes       Allergies: 1)  Sulfamethoxazole (Sulfamethoxazole)  Anticoagulation Management History:      The patient is taking warfarin and comes in today for a routine follow up visit.  Positive risk factors for bleeding include an age of 18 years or older.  The bleeding index is 'intermediate risk'.  Positive CHADS2 values include History of CHF, History of HTN, and Age > 49 years old.  Her last INR was 11.0 ratio.  Anticoagulation responsible provider: Eden Emms MD, Theron Arista.  INR POC: 2.9.  Cuvette Lot#: [id number].  Exp: 10/2011.    Anticoagulation Management Assessment/Plan:      The patient's current anticoagulation dose is Warfarin sodium 1 mg tabs: Take as directed by coumadin clinic..  The target INR is 2.0-3.0.  The next INR is due 11/21/2010.  Anticoagulation instructions were given to patient.  Results were reviewed/authorized by Bethena Midget, RN, BSN.  She was notified by Bethena Midget, RN, BSN.         Prior Anticoagulation Instructions: INR 2.1 Continue 2mg s everyday except 1mg s on Mondays, Wednesdays and Fridays. Recheck in 4 weeks.  Current Anticoagulation Instructions: INR 2.9 Continue 2mg s daily except 1mg s on Mondays, Wednesdays and Fridays. Recheck in 4 weeks.

## 2010-12-21 NOTE — Progress Notes (Signed)
Summary: phn msg   Phone Note Other Incoming Call back at [phone]   Caller: [facility] Summary of Call: Needs verbal orders for monthly B12 injections.   Initial call taken by: Clydell Hakim,  May 02, 2010 3:59 PM  Follow-up for Phone Call        checking on orders - found out that Lelon Perla is on vacation until tues.  can someone else give orders? Follow-up by: De Nurse,  May 05, 2010 11:58 AM  Additional Follow-up for Phone Call Additional follow up Details #1::        to Dr. Leveda Anna to handle. unaware pcp was out Additional Follow-up by: Golden Circle RN,  May 05, 2010 12:01 PM    Additional Follow-up for Phone Call Additional follow up Details #2::    Called, spoke to [place] and gave verbal order to continue B12 injections for another 2 months (max duration allowed for order). Follow-up by: Doralee Albino MD,  May 05, 2010 12:21 PM

## 2010-12-21 NOTE — Assessment & Plan Note (Signed)
Summary: f/u eo   Vital Signs:  Patient profile:   77 year old female Weight:      240.8 pounds BMI:     42.14 Temp:     97.0 degrees F BP sitting:   136 / 74  (left arm)  Vitals Entered By: Starleen Blue RN (March 13, 2010 8:45 AM) CC: f/u Is Patient Diabetic? No Pain Assessment Patient in pain? no        Primary Care Provider:  Dr. Lelon Perla  CC:  f/u.  History of Present Illness: 1. Arthritis:  She is having a lot of pain in her knees and ankles.  She is already limited in her mobility and has to use a walker to get around.  She is taking Tylenol Arthritis 2 times / day and Ultram 2 times / day.  2. CHF: Followed by Cardiology.  No new changes  3. Aortic aneurysm:  Followed by CT which was done a couple of months ago and there was no significant change.  She would not want surgery even if she needed it.      ROS: denies chest pain  Habits & Providers  Alcohol-Tobacco-Diet     Tobacco Status: never  Current Medications (verified): 1)  Toprol Xl 50 Mg Xr24h-Tab (Metoprolol Succinate) .... One and One-Half Tablets Daily 2)  Cozaar 100 Mg Tabs (Losartan Potassium) .... Take 1 Tablet Once A Day 3)  Ditropan Xl 5 Mg Tb24 (Oxybutynin Chloride) .... Take 1 Tablet By Mouth Once A Day 4)  Hydrochlorothiazide 25 Mg Tabs (Hydrochlorothiazide) .... Take 1 Tablet By Mouth Once A Day 5)  Simvastatin 80 Mg Tabs (Simvastatin) .Marland Kitchen.. 1 By Mouth At Bedtime 6)  Sm Calcium/vitamin D 500-200 Mg-Unit Tabs (Calcium Carbonate-Vitamin D) .... Take 1 Tablet By Mouth Twice A Day 7)  Tylenol 325 Mg Tabs (Acetaminophen) .... Take 2 Tablet By Mouth Four Times A Day As Needed 8)  Vitamin E 400 Unit Caps (Vitamin E) .... Take 1-2 Daily 9)  Sertraline Hcl 100 Mg Tabs (Sertraline Hcl) .... One Daily 10)  Cobal-1000 1000 Mcg/ml Soln (Cyanocobalamin) .Marland Kitchen.. [place] Monthly. Dispense One Dose. 11)  Power Retail buyer .... As Directed. Dispense 1. 12)  Fish Oil  Oil (Fish Oil) .Marland Kitchen.. 1 Capsule Most Days 13)   Tramadol Hcl 100 Mg Xr24h-Tab (Tramadol Hcl) .... Take 1 Tab By Mouth Daily As Needed For Pain 14)  Voltaren 1 % Gel (Diclofenac Sodium) .... Apply To Hands Qid As Needed Pain 15)  Lidoderm 5 % Ptch (Lidocaine) .... Apply To Affected Area For Up To 12 Hours in A 24 Hour Period As Needed Pain 16)  Walker .... Dispense One 17)  Warfarin Sodium 1 Mg Tabs (Warfarin Sodium) .... Take As Directed By Coumadin Clinic. 18)  Mephyton 5 Mg Tabs (Phytonadione) .... Take 1/2 Tablet Today and Save The Other Half Until Needed  Allergies: 1)  Sulfamethoxazole (Sulfamethoxazole)  Past History:  Past Medical History: Reviewed history from 07/21/2009 and no changes required. 1. Obesity 2. Depression 3. HTN 4. Osteoarthritis with pain in multiple joints 5. Hyperlipidemia 6. CKD: creatinine 1.5 in 8/10 7. h/o PE: several years ago per patient. 8. Ascending aortic aneurysm: Followed by Dr. Dorris Fetch.  CT chest (2/10) with stable 5 cm ascending aortic aneurysm.  Echo 7/10 with "mildly dilated" ascending aorta.   9. Echo (7/10): Technically difficult study.  Septal and inferolateral hypokinesis.  EF "moderately decreased." Mildly dilated ascending aorta.  Mild aortic insufficiency.  10.  Adenosine myoview (8/10):  Done because of technically difficult echo with ? LV systolic dysfunction and wall motion abnormalities.  This study showed normal EF 63% and normal wall motion.  No perfusion defect so no evidence of ischemia/infarction. 11.  Atrial fibrillation: Noted initially at adenosine myoview 8/10.  She was still in atrial fibrillation at followup appt today.    Social History: Reviewed history from 07/06/2009 and no changes required. Lives alone.  Children live within 1 mile of her & very supportive and involved in her care.  No EtOH, no tobacco.  Widowed x 14 years.  Walks with walker.    Review of Systems  The patient denies fever, weight loss, syncope, prolonged cough, and abdominal pain.     Physical Exam  General:  Vitals reviewed.  Obese WF sitting in chair with walker. Head:  normocephalic and atraumatic.   Eyes:  PERRL, EOMI Mouth:  MMM Neck:  no JVD Lungs:  Clear bilaterally to auscultation and percussion. Heart:  Distant heart sounds.  RRR, 1/6 SEM.  Carotid upstroke normal, no bruit. Pedals normal pulses. No edema.  Abdomen:  Bowel sounds positive; abdomen soft and non-tender without masses, organomegaly, or hernias noted. No hepatosplenomegaly.  Obese.  Msk:  Knees: bilateral joint line tenderness, non swollen, not red.  Ankles:  nonswollen, non-TTP. Extremities:  No clubbing or cyanosis.  Trace edema.  Chronic venous stasis changes   Impression & Recommendations:  Problem # 1:  OSTEOARTHRITIS, MULTI SITES (ICD-715.98) Assessment Deteriorated  Will increase to Tramadol 24hr for better baseline pain control Her updated medication list for this problem includes:    Tylenol 325 Mg Tabs (Acetaminophen) .Marland Kitchen... Take 2 tablet by mouth four times a day as needed    Tramadol Hcl 100 Mg Xr24h-tab (Tramadol hcl) .Marland Kitchen... Take 1 tab by mouth daily as needed for pain  Orders: [facility]- Est Level  3 (04540)  Problem # 2:  CHF (ICD-428.0) Assessment: Unchanged  Her updated medication list for this problem includes:    Toprol Xl 50 Mg Xr24h-tab (Metoprolol succinate) ..... One and one-half tablets daily    Cozaar 100 Mg Tabs (Losartan potassium) .Marland Kitchen... Take 1 tablet once a day    Hydrochlorothiazide 25 Mg Tabs (Hydrochlorothiazide) .Marland Kitchen... Take 1 tablet by mouth once a day    Warfarin Sodium 1 Mg Tabs (Warfarin sodium) .Marland Kitchen... Take as directed by coumadin clinic.  Orders: [facility]- Est Level  3 (98119)  Problem # 3:  ANEURYSM, THORACIC AORTIC (ICD-441.2) Assessment: Unchanged  Orders: [facility]- Est Level  3 (14782)  Complete Medication List: 1)  Toprol Xl 50 Mg Xr24h-tab (Metoprolol succinate) .... One and one-half tablets daily 2)  Cozaar 100 Mg Tabs (Losartan potassium) .... Take  1 tablet once a day 3)  Ditropan Xl 5 Mg Tb24 (Oxybutynin chloride) .... Take 1 tablet by mouth once a day 4)  Hydrochlorothiazide 25 Mg Tabs (Hydrochlorothiazide) .... Take 1 tablet by mouth once a day 5)  Simvastatin 80 Mg Tabs (Simvastatin) .Marland Kitchen.. 1 by mouth at bedtime 6)  Sm Calcium/vitamin D 500-200 Mg-unit Tabs (Calcium carbonate-vitamin d) .... Take 1 tablet by mouth twice a day 7)  Tylenol 325 Mg Tabs (Acetaminophen) .... Take 2 tablet by mouth four times a day as needed 8)  Vitamin E 400 Unit Caps (Vitamin e) .... Take 1-2 daily 9)  Sertraline Hcl 100 Mg Tabs (Sertraline hcl) .... One daily 10)  Cobal-1000 1000 Mcg/ml Soln (Cyanocobalamin) .Marland Kitchen.. [place] monthly. dispense one dose. 11)  Power Retail buyer  .... As directed.  dispense 1. 12)  Fish Oil Oil (Fish oil) .Marland Kitchen.. 1 capsule most days 13)  Tramadol Hcl 100 Mg Xr24h-tab (Tramadol hcl) .... Take 1 tab by mouth daily as needed for pain 14)  Voltaren 1 % Gel (Diclofenac sodium) .... Apply to hands qid as needed pain 15)  Lidoderm 5 % Ptch (Lidocaine) .... Apply to affected area for up to 12 hours in a 24 hour period as needed pain 16)  Walker  .... Dispense one 17)  Warfarin Sodium 1 Mg Tabs (Warfarin sodium) .... Take as directed by coumadin clinic. 18)  Mephyton 5 Mg Tabs (Phytonadione) .... Take 1/2 tablet today and save the other half until needed Prescriptions: TRAMADOL HCL 100 MG XR24H-TAB (TRAMADOL HCL) Take 1 tab by mouth daily as needed for pain  #120 x 3   Entered and Authorized by:   Angelena Sole MD   Signed by:   Angelena Sole MD on 03/13/2010   Method used:   Faxed to ...       [facility] ([facility]ail)       [address]       [place], [place]  [address]       Ph: [phone]       Fax: [phone]   RxID:   [id number]    Vital Signs:  Patient profile:   77 year old female Weight:      240.8 pounds BMI:     42.14 Temp:     97.0 degrees F BP sitting:   136 / 74  (left  arm)  Vitals Entered By: Starleen Blue RN (March 13, 2010 8:45 AM)    Prevention & Chronic Care Immunizations   Influenza vaccine: Fluvax 3+  (08/25/2008)   Influenza vaccine due: 08/25/2009    Tetanus booster: 02/17/2005: Done.   Tetanus booster due: 02/18/2015    Pneumococcal vaccine: Pneumovax  (08/25/2008)   Pneumococcal vaccine due: None    H. zoster vaccine: 08/25/2008: Refused due to cost  Colorectal Screening   Hemoccult: Done.  (06/19/2000)   Hemoccult due: Not Indicated    Colonoscopy: Done.  (06/20/1999)   Colonoscopy due: 06/19/2009  Other Screening   Pap smear: normal  (02/02/2009)   Pap smear due: Not Indicated    Mammogram: ASSESSMENT: Negative - BI-RADS 1^MM DIGITAL SCREENING  (05/16/2009)   Mammogram due: 04/27/2009    DXA bone density scan: normal  (04/27/2008)   DXA scan due: None    Smoking status: never  (03/13/2010)  Lipids   Total Cholesterol: 134  (07/11/2009)   LDL: 68  (07/11/2009)   LDL Direct: 107  (11/23/2008)   HDL: 36.50  (07/11/2009)   Triglycerides: 149.0  (07/11/2009)    SGOT (AST): 23  (07/11/2009)   SGPT (ALT): 12  (07/11/2009)   Alkaline phosphatase: 53  (07/11/2009)   Total bilirubin: 0.8  (07/11/2009)    Lipid flowsheet reviewed?: Yes   Progress toward LDL goal: Unchanged  Hypertension   Last Blood Pressure: 136 / 74  (03/13/2010)   Serum creatinine: 1.5  (07/11/2009)   Serum potassium 3.9  (07/11/2009)    Hypertension flowsheet reviewed?: Yes   Progress toward BP goal: Unchanged  Self-Management Support :   Personal Goals (by the next clinic visit) :      Personal blood pressure goal: 140/90  (03/13/2010)     Personal LDL goal: 100  (03/13/2010)    Hypertension self-management support: Not documented  Lipid self-management support: Not documented

## 2010-12-21 NOTE — Miscellaneous (Signed)
   Clinical Lists Changes  Problems: Removed problem of CHF (ICD-428.0)

## 2010-12-21 NOTE — Progress Notes (Signed)
Summary: Orders Needed   Phone Note Other Incoming Call back at [phone]   Caller: [facility] Summary of Call: Need orders for B12 inj.  Just need ok to send new orders to Dr. Lelon Perla.  The orders need to say intermusclular vs [place] for her ins to pay for inj. Initial call taken by: Clydell Hakim,  January 03, 2010 9:47 AM

## 2010-12-21 NOTE — Progress Notes (Signed)
Summary: Rx Prob   Phone Note Refill Request Call back at Home Phone [phone] Message from:  Patient  Refills Requested: Medication #1:  TRAMADOL HCL 100 MG XR24H-TAB Take 1 tab by mouth daily as needed for pain Pharmacy says they have no record of this being sent in.  Can this be done.  [facility].  Initial call taken by: Clydell Hakim,  Mar 22, 2010 2:20 PM Summary of Call: Pt states   Follow-up for Phone Call        called pharmacy and they do have RX thay MD sent in on 03/13/2010. patient thought MD was going to increase the dosage. she had been on 50 mg twice daily.  will forward to MD. Follow-up by: Theresia Lo RN,  Mar 22, 2010 3:06 PM  Additional Follow-up for Phone Call Additional follow up Details #1::        It was increased to 100 Additional Follow-up by: Angelena Sole MD,  Mar 27, 2010 8:41 AM

## 2010-12-21 NOTE — Medication Information (Signed)
Summary: rov/eac  Anticoagulant Therapy  Managed by: Weston Brass, PharmD Referring MD: [place] PCP: Dr. Ferdinand Cava MD: Jens Som MD, Arlys John Indication 1: Atrial Fibrillation Lab Used: [facility] [place] Site: [address] INR POC 2.0 INR RANGE 2.0-3.0  Dietary changes: no    Health status changes: no    Bleeding/hemorrhagic complications: no    Recent/future hospitalizations: no    Any changes in medication regimen? yes       Details: increased tramadol today   Recent/future dental: no  Any missed doses?: no       Is patient compliant with meds? yes       Allergies: 1)  Sulfamethoxazole (Sulfamethoxazole)  Anticoagulation Management History:      The patient is taking warfarin and comes in today for a routine follow up visit.  Positive risk factors for bleeding include an age of 75 years or older.  The bleeding index is 'intermediate risk'.  Positive CHADS2 values include History of CHF and History of HTN.  Negative CHADS2 values include Age > 16 years old.  Her last INR was 11.0 ratio.  Anticoagulation responsible provider: Jens Som MD, Arlys John.  INR POC: 2.0.  Cuvette Lot#: [id number].  Exp: 03/2011.    Anticoagulation Management Assessment/Plan:      The patient's current anticoagulation dose is Warfarin sodium 1 mg tabs: Take as directed by coumadin clinic..  The target INR is 2.0-3.0.  The next INR is due 04/10/2010.  Anticoagulation instructions were given to patient.  Results were reviewed/authorized by Weston Brass, PharmD.  She was notified by Weston Brass PharmD.         Prior Anticoagulation Instructions: INR 1.7  Take 2 tablets today.  Then restart normal dosing schedule of 1 tablet on Monday, Wednesday, and friday, and 2 tablets all other days.  Return to clinic in 4 weeks.    Current Anticoagulation Instructions: INR 2.0  Continue same dose of 2 tablets every day except 1 tablet on Monday, Wednesday and Friday

## 2010-12-21 NOTE — Progress Notes (Signed)
Summary: Prob with med   Phone Note Other Incoming   Caller: [facility] Summary of Call: Patient not getting any relief from the 100mg  tabs of tramadol once daily, wants to know if there is anything else sha can take or if she can take the tramadol more than once a day, pt uses [facility]. Message to MD Initial call taken by: Garen Grams LPN,  Apr 04, 2010 3:56 PM  Follow-up for Phone Call        She can take 2 pills a day.  I would recommend taking them at the same time but she can split them up to twice a day if she feels like she gets better relief Follow-up by: Angelena Sole MD,  Apr 06, 2010 1:35 PM  Additional Follow-up for Phone Call Additional follow up Details #1::        Called pt and informed her of above, she expressed understanding. Also called and left vm informing Kelly. Additional Follow-up by: Garen Grams LPN,  Apr 06, 2010 3:40 PM

## 2010-12-21 NOTE — Progress Notes (Signed)
Summary: Orders Needed   Phone Note Other Incoming Call back at [phone]   Caller: [facility] Summary of Call: Needs ok to send orders for Laura Griffith incontinence supplies. Initial call taken by: Clydell Hakim,  March 01, 2010 3:44 PM  Follow-up for Phone Call        [facility] for verbal order Follow-up by: Angelena Sole MD,  March 01, 2010 4:03 PM  Additional Follow-up for Phone Call Additional follow up Details #1::        [facility] informed. Additional Follow-up by: Garen Grams LPN,  March 02, 2010 4:55 PM

## 2010-12-21 NOTE — Medication Information (Signed)
Summary: rov/ewj  Anticoagulant Therapy  Managed by: Cloyde Reams, RN, BSN Referring MD: D.Kensey Luepke PCP: Dr. Ferdinand Cava MD: Shirlee Latch MD, Ardythe Klute Indication 1: Atrial Fibrillation Lab Used: [facility] [place] Site: [address] INR POC 2.2 INR RANGE 2.0-3.0  Dietary changes: no    Health status changes: no    Bleeding/hemorrhagic complications: yes       Details: 1 episode of blood on tissue when wiped.   Recent/future hospitalizations: no    Any changes in medication regimen? no    Recent/future dental: no  Any missed doses?: no       Is patient compliant with meds? yes       Allergies: 1)  Sulfamethoxazole (Sulfamethoxazole)  Anticoagulation Management History:      The patient is taking warfarin and comes in today for a routine follow up visit.  Positive risk factors for bleeding include an age of 17 years or older.  The bleeding index is 'intermediate risk'.  Positive CHADS2 values include History of CHF and History of HTN.  Negative CHADS2 values include Age > 23 years old.  Her last INR was 11.0 ratio.  Anticoagulation responsible provider: Shirlee Latch MD, Kross Swallows.  INR POC: 2.2.  Exp: 07/2011.    Anticoagulation Management Assessment/Plan:      The patient's current anticoagulation dose is Warfarin sodium 1 mg tabs: Take as directed by coumadin clinic..  The target INR is 2.0-3.0.  The next INR is due 06/26/2010.  Anticoagulation instructions were given to patient.  Results were reviewed/authorized by Cloyde Reams, RN, BSN.  She was notified by Cloyde Reams RN.         Prior Anticoagulation Instructions: INR 1.8  Take 2 tablets today, then resume same dosage 2 tablets daily except 1 tablet on Mondays, Wednesdays, and Fridays.  Recheck in 3 weeks.    Current Anticoagulation Instructions: INR 2.2  Continue on same dosage 2 tablets daily except 1 tablet on Mondays, Wednesdays, and Fridays.  Recheck in 4 weeks.

## 2010-12-27 NOTE — Medication Information (Signed)
Summary: rov/ewj  Anticoagulant Therapy  Managed by: Louann Sjogren, PharmD Referring MD: Golden Circle PCP: Dr. Ferdinand Cava MD: Antoine Poche MD,Yousif Edelson Indication 1: Atrial Fibrillation Lab Used: [facility] [place] Site: [address] INR POC 2.7 INR RANGE 2.0-3.0  Dietary changes: no    Health status changes: no    Bleeding/hemorrhagic complications: no    Recent/future hospitalizations: no    Any changes in medication regimen? no    Recent/future dental: no  Any missed doses?: no       Is patient compliant with meds? yes       Allergies: 1)  Sulfamethoxazole (Sulfamethoxazole)  Anticoagulation Management History:      The patient is taking warfarin and comes in today for a routine follow up visit.  Positive risk factors for bleeding include an age of 75 years or older.  The bleeding index is 'intermediate risk'.  Positive CHADS2 values include History of CHF, History of HTN, and Age > 75 years old.  Her last INR was 11.0 ratio.  Anticoagulation responsible provider: Jaclin Finks MD,Taleya Whitcher.  INR POC: 2.7.  Cuvette Lot#: [id number].  Exp: 12/2011.    Anticoagulation Management Assessment/Plan:      The patient's current anticoagulation dose is Warfarin sodium 1 mg tabs: Take as directed by coumadin clinic..  The target INR is 2.0-3.0.  The next INR is due 01/15/2011.  Anticoagulation instructions were given to patient.  Results were reviewed/authorized by Louann Sjogren, PharmD.         Prior Anticoagulation Instructions: INR 2.7  Continue on same dosage 2 tablets daily except 1 tablet on Mondays, Wednesdays, and Fridays.  Recheck in 4 weeks.   Current Anticoagulation Instructions: INR (goal 2-3)  Continue taking 2 tablets everyday except take 1 tablet on Mondays, Wednesdays, and Fridays. Recheck INR in 4 weeks on Feb. 27th at 8:30AM.

## 2010-12-28 ENCOUNTER — Telehealth: Payer: Self-pay | Admitting: Family Medicine

## 2010-12-28 DIAGNOSIS — E538 Deficiency of other specified B group vitamins: Secondary | ICD-10-CM

## 2011-01-09 MED ORDER — CYANOCOBALAMIN 1000 MCG/ML IJ SOLN: 1000.0000 ug | INTRAMUSCULAR | Status: DC

## 2011-01-09 NOTE — Telephone Encounter (Signed)
Okay to continue B12 injections.

## 2011-01-11 ENCOUNTER — Other Ambulatory Visit: Payer: Self-pay | Admitting: Thoracic Surgery (Cardiothoracic Vascular Surgery)

## 2011-01-11 DIAGNOSIS — I712 Thoracic aortic aneurysm, without rupture: Secondary | ICD-10-CM

## 2011-01-12 ENCOUNTER — Encounter: Payer: Self-pay | Admitting: Cardiology

## 2011-01-12 DIAGNOSIS — I4891 Unspecified atrial fibrillation: Secondary | ICD-10-CM

## 2011-01-14 DIAGNOSIS — I4891 Unspecified atrial fibrillation: Secondary | ICD-10-CM

## 2011-01-15 ENCOUNTER — Encounter: Payer: Self-pay | Admitting: Internal Medicine

## 2011-01-15 ENCOUNTER — Encounter (INDEPENDENT_AMBULATORY_CARE_PROVIDER_SITE_OTHER): Payer: PRIVATE HEALTH INSURANCE

## 2011-01-15 DIAGNOSIS — I4891 Unspecified atrial fibrillation: Secondary | ICD-10-CM

## 2011-01-15 DIAGNOSIS — Z7901 Long term (current) use of anticoagulants: Secondary | ICD-10-CM

## 2011-01-15 LAB — CONVERTED CEMR LAB: POC INR: 2.8

## 2011-01-25 NOTE — Medication Information (Signed)
Summary: rov/amr  Anticoagulant Therapy  Managed by: Weston Brass, PharmD Referring MD: [place] PCP: Dr. Ferdinand Cava MD: Tenny Craw MD, Gunnar Fusi Indication 1: Atrial Fibrillation Lab Used: [facility] [place] Site: [address] INR POC 2.8 INR RANGE 2.0-3.0  Dietary changes: no    Health status changes: no    Bleeding/hemorrhagic complications: no    Recent/future hospitalizations: no    Any changes in medication regimen? no    Recent/future dental: no  Any missed doses?: no       Is patient compliant with meds? yes       Allergies: 1)  Sulfamethoxazole (Sulfamethoxazole)  Anticoagulation Management History:      The patient is taking warfarin and comes in today for a routine follow up visit.  Positive risk factors for bleeding include an age of 75 years or older.  The bleeding index is 'intermediate risk'.  Positive CHADS2 values include History of CHF, History of HTN, and Age > 35 years old.  Her last INR was 11.0 ratio.  Anticoagulation responsible provider: Tenny Craw MD, Gunnar Fusi.  INR POC: 2.8.  Cuvette Lot#: [id number].  Exp: 11/2011.    Anticoagulation Management Assessment/Plan:      The patient's current anticoagulation dose is Warfarin sodium 1 mg tabs: Take as directed by coumadin clinic..  The target INR is 2.0-3.0.  The next INR is due 02/12/2011.  Anticoagulation instructions were given to patient.  Results were reviewed/authorized by Weston Brass, PharmD.  She was notified by Margot Chimes PharmD Candidate.         Prior Anticoagulation Instructions: INR (goal 2-3)  Continue taking 2 tablets everyday except take 1 tablet on Mondays, Wednesdays, and Fridays. Recheck INR in 4 weeks on Feb. 27th at 8:30AM.  Current Anticoagulation Instructions: INR 2.8  Continue to take 2 tablets daily except on Mondays, Wednesdays, and Fridays when you only take 1 tablet.  Recheck INR in 4 weeks.

## 2011-02-01 ENCOUNTER — Encounter: Payer: Self-pay | Admitting: Cardiology

## 2011-02-12 ENCOUNTER — Encounter (INDEPENDENT_AMBULATORY_CARE_PROVIDER_SITE_OTHER): Payer: PRIVATE HEALTH INSURANCE | Admitting: Thoracic Surgery (Cardiothoracic Vascular Surgery)

## 2011-02-12 ENCOUNTER — Ambulatory Visit
Admission: RE | Admit: 2011-02-12 | Discharge: 2011-02-12 | Disposition: A | Discharge status: Home / Self Care | Payer: PRIVATE HEALTH INSURANCE | Source: Ambulatory Visit | Attending: Thoracic Surgery (Cardiothoracic Vascular Surgery) | Admitting: Thoracic Surgery (Cardiothoracic Vascular Surgery)

## 2011-02-12 DIAGNOSIS — I712 Thoracic aortic aneurysm, without rupture: Secondary | ICD-10-CM

## 2011-02-12 MED ORDER — IOHEXOL 300 MG/ML  SOLN
75.0000 mL | Freq: Once | INTRAMUSCULAR | Status: AC | PRN
  Administered 2011-02-12: 75 mL via INTRAVENOUS

## 2011-02-13 NOTE — Assessment & Plan Note (Signed)
OFFICE VISIT  Laura Griffith, Laura Griffith DOB:  Aug 21, 1933                                        February 12, 2011 CHART #:  [id number]  Laura Griffith is a 77 year old woman who has been following since 2008 with a 5-cm ascending aorta.  She was last seen in the office in March of last year, which time, she was doing well, there have been no interval changes.  She now returns for 1-year followup.  She says that since that visit, she has been doing pretty well.  Her mobility still very limited, but she has not had any major new medical issues that are worrisome. She does have multiple chronic problems.  She is no longer seeing Dr. Clelia Croft, but still seeing Dr. Shirlee Latch for her heart.  CURRENT MEDICATIONS: 1. Vitamin E 400 international units daily. 2. Vitamin B12 shots monthly. 3. Hydrochlorothiazide 25 mg daily. 4. Fish oil 1000 mg daily. 5. Toprol-XL 75 mg daily. 6. Coumadin as directed. 7. Sertraline 100 mg daily. 8. Losartan 100 mg daily. 9. Tramadol p.r.n. for pain. 10.Oxybutynin 5 mg daily. 11.Crestor 10 mg daily.  She has an allergy to SULFA.  There has been no change in her family or social history.  Her activities are still very limited.  She does walk with a walker.  REVIEW OF SYSTEMS:  Denies any chest pain.  She has chronic shortness of breath, which is unchanged.  All other systems are negative for new findings.  PHYSICAL EXAMINATION:  General:  Laura Griffith is a morbidly obese 77 year old woman in no acute distress.  Vital Signs:  Blood pressure 123/78, pulse 80, respirations 16, oxygen saturation 98% on room air.  Neck: Without bruits.  Cardiac:  Has regular rate and rhythm.  I did not hear a murmur.  Lungs:  Clear with equal breath sounds bilaterally.  Abdomen: Obese.  Extremities:  There is chronic venous stasis changes in bilateral lower extremities.  2+ radial pulses bilaterally.  CT of the chest is reviewed shows no interval change from her scan  a year ago and there has been little with no change over the last 4 years.  IMPRESSION:  Laura Griffith is a 77 year old woman has been followed for the past 4 years with a 5-cm ascending aortic aneurysm.  Her aorta is really enlarged throughout the thorax in about 3.5 cm in the arch and proximal descending aorta and then returns to normal in the mid descending aorta, and she appears to have arteriomegaly of the great vessels as well. There was no surgical indication at this time.  I would recommend following her up with a scan in a year.  I did talk to her about trying to do an MR, angio next time to cut down on the ionizing radiation, so we will also to schedule that for a year from now.  Salvatore Decent Dorris Fetch, M.D. Electronically Signed  SCH/MEDQ  D:  02/12/2011  T:  02/13/2011  Job:  [id number]

## 2011-02-15 ENCOUNTER — Encounter: Payer: Self-pay | Admitting: Cardiology

## 2011-02-15 ENCOUNTER — Ambulatory Visit (INDEPENDENT_AMBULATORY_CARE_PROVIDER_SITE_OTHER): Payer: PRIVATE HEALTH INSURANCE | Admitting: *Deleted

## 2011-02-15 ENCOUNTER — Ambulatory Visit (INDEPENDENT_AMBULATORY_CARE_PROVIDER_SITE_OTHER): Payer: PRIVATE HEALTH INSURANCE | Admitting: Cardiology

## 2011-02-15 VITALS — BP 112/73 | HR 90 | Ht 63.0 in | Wt 223.0 lb

## 2011-02-15 DIAGNOSIS — I4891 Unspecified atrial fibrillation: Secondary | ICD-10-CM

## 2011-02-15 DIAGNOSIS — I10 Essential (primary) hypertension: Secondary | ICD-10-CM

## 2011-02-15 DIAGNOSIS — E785 Hyperlipidemia, unspecified: Secondary | ICD-10-CM

## 2011-02-15 DIAGNOSIS — I712 Thoracic aortic aneurysm, without rupture: Secondary | ICD-10-CM

## 2011-02-15 DIAGNOSIS — I5032 Chronic diastolic (congestive) heart failure: Secondary | ICD-10-CM

## 2011-02-15 DIAGNOSIS — I509 Heart failure, unspecified: Secondary | ICD-10-CM

## 2011-02-15 NOTE — Patient Instructions (Signed)
Appointment with Dr Shirlee Latch in 6 months.(September 2012). You should get a reminder letter in the mail a couple of months before the appointment is due.

## 2011-02-15 NOTE — Assessment & Plan Note (Signed)
BP has been under good control.  Continue current regimen of losartan, HCTZ, and Toprol XL.

## 2011-02-15 NOTE — Assessment & Plan Note (Signed)
Stable at 4.9 cm.  Followed by Dr. Dorris Fetch.  Need to continue aggressive BP control.

## 2011-02-15 NOTE — Progress Notes (Signed)
77 yo with history of ascending aortic aneurysm, obesity, atrial fibrillation, and significant osteoarthritis presents for cardiology followup.  She has an ascending aortic aneurysm measuring 4.9 cm (stable size) that is being followed by Dr. Dorris Fetch.  Last CT was in 3/12.  She is living at home but has help.  She is sedentary and uses a walker to get around for stability.  She has pain in her knees, lower legs, and ankles.  She is morbidly obese.  She is not short of breath walking slowly with her walker but will become short of breath if she "rushes."  She does housework and cooks and gets mildly short of breath with heavier housework.  Denies orthopnea.  She has been steady on her feet with no falls and is tolerating the coumadin ok.  She is in atrial fibrillation today.  She gets "twinges" of chest pain that is nonexertional and lasts only for seconds.  BP is under good control.   ECG: atrial fibrillation at 93 with IVCD  Labs (12/11): K 4.4, creatinine 1.2, LDL 66, HDL 44  Allergies:  1)  Sulfamethoxazole (Sulfamethoxazole)  Past Medical History: 1. Obesity 2. Depression 3. HTN 4. Osteoarthritis with pain in multiple joints 5. Hyperlipidemia 6. CKD: creatinine 1.5 in 8/10 7. h/o PE: several years ago per patient. 8. Ascending aortic aneurysm: Followed by Dr. Dorris Fetch.  CT chest (3/11) with stable 4.8 cm ascending aortic aneurysm.  CTA (3/12) with 4.9 cm ascending aortic aneurysm.  9. Echo (7/10): Technically difficult study.  Septal and inferolateral hypokinesis.  EF "moderately decreased." Mildly dilated ascending aorta.  Mild aortic insufficiency.  10.  Adenosine myoview (8/10): Done because of technically difficult echo with ? LV systolic dysfunction and wall motion abnormalities.  This study showed normal EF 63% and normal wall motion.  No perfusion defect so no evidence of ischemia/infarction. 11.  Atrial fibrillation: Noted initially at adenosine myoview 8/10.  She seems to be  in chronic atrial fibrillation at this point.   Family History: Longevity.  No CVA, no CAD, no neurologic dz.  Breast Cancer in mother (64).  4 sisters alive and well. No h/o fracture  Social History: Lives alone.  Children live within 1 mile of her & very supportive and involved in her care.  No EtOH, no tobacco.  Widowed.  Walks with walker.    Current Outpatient Prescriptions  Medication Sig Dispense Refill  . Calcium Carbonate-Vitamin D (CALCIUM-VITAMIN D) 500-200 MG-UNIT per tablet Take 1 tablet by mouth 2 (two) times daily with meals.        . cyanocobalamin (COBAL-1000) 1000 MCG/ML injection Inject 1 mL (1,000 mcg total) into the skin every 30 (thirty) days.  1 mL  6  . fish oil-omega-3 fatty acids 1000 MG capsule Take 2 g by mouth daily.        . hydrochlorothiazide 25 MG tablet Take 25 mg by mouth daily.        Marland Kitchen losartan (COZAAR) 100 MG tablet Take 100 mg by mouth daily.        . metoprolol (TOPROL-XL) 50 MG 24 hr tablet Take 75 mg by mouth daily.        Marland Kitchen oxybutynin (DITROPAN) 5 MG tablet Take 5 mg by mouth daily.        . rosuvastatin (CRESTOR) 10 MG tablet Take 10 mg by mouth daily.        . sertraline (ZOLOFT) 100 MG tablet Take 100 mg by mouth daily.        Marland Kitchen  traMADol (ULTRAM-ER) 200 MG 24 hr tablet Take 200 mg by mouth daily.        Marland Kitchen warfarin (COUMADIN) 1 MG tablet Take by mouth as directed.       Marland Kitchen acetaminophen (TYLENOL) 650 MG CR tablet Take 650 mg by mouth as needed.         BP 112/73  Pulse 90  Ht 5\' 3"  (1.6 m)  Wt 223 lb (101.152 kg)  BMI 39.50 kg/m2 General:  Morbidly obese.  No acute distress Neck:  Neck supple, no JVD. No masses, thyromegaly or abnormal cervical nodes. Lungs:  Clear bilaterally to auscultation and percussion. Heart:  Non-displaced PMI, chest non-tender; irregular rate and rhythm, S1, S2 without murmurs, rubs or gallops. Carotid upstroke normal, no bruit.  Pedals normal pulses. Trace ankle edema bilaterally.  Abdomen:  Bowel sounds positive;  abdomen soft and non-tender without masses, organomegaly, or hernias noted. No hepatosplenomegaly. Extremities:  No clubbing or cyanosis. Neurologic:  Alert and oriented x 3. Psych:  Normal affect.

## 2011-02-15 NOTE — Assessment & Plan Note (Signed)
Continue rate control and coumadin.  She has had no falls and walks with a walker.

## 2011-02-15 NOTE — Patient Instructions (Signed)
Continue 2 pills everyday except 1 pill on Mondays, Wednesdays, and Fridays. Recheck in 4 weeks.

## 2011-02-15 NOTE — Assessment & Plan Note (Signed)
Stable exertional dyspnea.  Patient is not volume overloaded on exam.

## 2011-02-15 NOTE — Assessment & Plan Note (Signed)
Excellent lipids on Crestor.

## 2011-02-26 ENCOUNTER — Other Ambulatory Visit: Payer: Self-pay | Admitting: *Deleted

## 2011-02-26 MED ORDER — ROSUVASTATIN CALCIUM 10 MG PO TABS: 10.0000 mg | ORAL_TABLET | Freq: Every day | ORAL | Status: DC

## 2011-02-28 ENCOUNTER — Telehealth: Payer: Self-pay | Admitting: Family Medicine

## 2011-02-28 NOTE — Telephone Encounter (Signed)
Ok to continue B12 and supplies as needed

## 2011-02-28 NOTE — Telephone Encounter (Signed)
Left message on [facility] voicemail giving verbal authorization.

## 2011-02-28 NOTE — Telephone Encounter (Signed)
Laura Griffith is needing verbal order to continue b12 injections and order of supplies as needed

## 2011-03-03 ENCOUNTER — Other Ambulatory Visit: Payer: Self-pay | Admitting: Cardiology

## 2011-03-19 ENCOUNTER — Ambulatory Visit (INDEPENDENT_AMBULATORY_CARE_PROVIDER_SITE_OTHER): Payer: PRIVATE HEALTH INSURANCE | Admitting: *Deleted

## 2011-03-19 DIAGNOSIS — I4891 Unspecified atrial fibrillation: Secondary | ICD-10-CM

## 2011-03-19 LAB — POCT INR: INR: 3.1

## 2011-03-23 ENCOUNTER — Other Ambulatory Visit: Payer: Self-pay | Admitting: Family Medicine

## 2011-03-26 NOTE — Telephone Encounter (Signed)
Refill request

## 2011-04-03 NOTE — Assessment & Plan Note (Signed)
OFFICE VISIT   Laura Griffith, AN  DOB:  1933-02-02                                        September 01, 2007  CHART #:  [id number]   REASON FOR CONSULTATION:  Thoracic aortic aneurysm.   HISTORY OF PRESENT ILLNESS:  Laura Griffith is a 77 year old woman with  multiple medical problems including hypertension and hyperlipidemia,  previous history of myocardial infarction, history of pulmonary embolus  and multiple orthopedic procedures.  She was hospitalized in July  following a right tibial fracture.  She has ORIF of that on July 28.  During her evaluation, she had a chest x-ray which showed a question of  a lung nodule.  A CT scan was done and she was noted to have an  ascending aortic aneurysm.  This was described as 4.5 cm in diameter.  She states that she does have occasional chest pain, but has not had one  in a long time.  She does get short of breath with heavy exertion and  that has been present for many years.  There has been no recent change  in that.   She did in July have an echocardiogram which showed ejection fraction of  55-60%.  There was diastolic dysfunction.  There was mild aortic  insufficiency.  There was no aortic stenosis.  The aortic root appeared  to be about 4 cm by that exam.   PAST MEDICAL HISTORY:  1. Morbid obesity.  2. Hypertension.  3. Hyperlipidemia.  4. Pulmonary embolus.  5. Previous knee replacement.  6. Previous ankle fracture.  7. Recent tibial fracture.  8. Nephrolithiasis.  9. Previous hysterectomy.  10.Previous cholecystectomy.  11.Coronary artery disease with previous myocardial infarction.  12.Hemorrhoids.  13.She did have an inferior ven cava filter placed for pulmonary      embolus.   CURRENT MEDICATIONS:  1. Wellbutrin 150 mg b.i.d.  2. Ditropan 5 mg nightly.  3. Lipitor 20 mg daily.  4. Atenolol 50 mg daily.  5. Cozaar 50 mg daily.  6. Boniva once a month.  7. Os-Cal 500 mg twice daily.  8. Vitamin E  400 units daily.  9. Aspirin 81 mg daily.  10.Tylenol p.r.n.   ALLERGIES:  SULFA.   FAMILY HISTORY:  She has had a brother and sister who has had open heart  surgery.  No history of aneurysms.   SOCIAL HISTORY:  She is widowed.  She had eight children.  She is  retired.  Physical activities are limited particularly by degenerative  joint disease.  She does not smoke or drink.   REVIEW OF SYSTEMS:  She is 5 feet 3 inches tall, 270 pounds.  Chest pain  and shortness of breath as mentioned.  She denies any wheezing or  hemoptysis.  No recent change in bowel or bladder habits.  She has pain  from her recent tibial fracture.  No classical claudication type pain.  She does have headaches, arthritis and joint pain.  She is sometimes  nervous.  She does have a history of hemorrhoids which occasionally  bleed with nothing recent.  All other systems are negative.   PHYSICAL EXAMINATION:  GENERAL:  Laura Griffith is a morbidly obese, 77-year-  old, white female in a wheelchair.  VITAL SIGNS:  Blood pressure 174/97, pulse 73, respirations 18, oxygen  saturations 94% on room air.  NEUROLOGIC:  She is alert and oriented x3.  She is appropriate and  grossly intact.  HEENT:  She is wearing glasses.  NECK:  Supple without thyromegaly, adenopathy or bruits.  CARDIAC:  Regular rate and rhythm with normal S1, S2.  There is a slight  S4.  There is no murmur to my exam and no rub.  LUNGS:  Diminished breath sounds bilaterally.  No wheezing.  ABDOMEN:  Obese, soft.  EXTREMITIES:  She has a brace on the right leg below the knee.  There is  no edema in the left leg.  She does have some edema at the ankle.  She  has faintly palpable pulses in the lower extremities.   LABORATORY DATA AND X-RAY FINDINGS:  Chest CT from September 15, is  reviewed.  It shows an ascending aortic aneurysm approximately 4.6 to  4.7 cm in diameter.  Official read was approximately 5 cm.  There is  some cardiac calcification.  There  is cardiomegaly.  Her previous right  lower lobe air space disease had resolved.  There was a 1 cm, right  paratracheal node related to her previous infection.   IMPRESSION/PLAN:  Laura Griffith is a 77 year old woman with approximately  4.6-4.7 cm, ascending, aortic aneurysm.  She does have mild aortic  insufficiency.  There is no indication for surgery at this time.  I had  a long discussion with the patient and her daughter regarding the  indications, risks, benefits and alternatives.  At this point with this  aneurysm, only 4.7 cm, there is no surgical indication she is at high-  risk from the surgery with the chance of having dissection or rupture,  therefore, she should continue to be followed.  She has been seen by Dr.  Diona Browner and myself recently.  We plan to see her back in 6 months with  a repeat CT angiogram.  She will also need periodic echocardiograms and  we could likely put her on a schedule where she has a CT annually  interspersed with annual echocardiograms so that she has one of the  other study at 6 month intervals.  I emphasized to her the importance of  blood pressure control.  She did have hypertension today.  I suspect a  lot of that was anxiety related to seeing a surgeon about a possible  aneurysm repair, but I did advise her to have her blood pressure checked  regularly and to stay on her medications and to make sure they remained  controlled.  I emphasized the importance for long-term health of weight  loss and exercise.   Laura Griffith, M.D.  Electronically Signed   SCH/MEDQ  D:  09/01/2007  T:  09/02/2007  Job:  [id number]   cc:   Laura Griffith, M.D.  Laura Sidle, MD

## 2011-04-03 NOTE — Op Note (Signed)
NAMESENG, FOUTS               ACCOUNT NO.:  [id number]   MEDICAL RECORD NO.:  [id number]          PATIENT TYPE:  INP   LOCATION:  4705                         FACILITY:  [facility]   PHYSICIAN:  Doralee Albino. Carola Frost, M.D. DATE OF BIRTH:  1933-10-07   DATE OF PROCEDURE:  06/17/2007  DATE OF DISCHARGE:                               OPERATIVE REPORT   PREOPERATIVE DIAGNOSIS:  Right tibial shaft fracture.   POSTOPERATIVE DIAGNOSIS:  Right tibial shaft fracture.   PROCEDURE:  Open reduction and internal fixation of right tibial  fracture.   SURGEON:  Doralee Albino. Carola Frost, M.D.   ASSISTANT:  Athena Masse, RNFA   ANESTHESIA:  General.   COMPLICATIONS:  None.   ESTIMATED BLOOD LOSS:  Minimal.   DISPOSITION:  PACU.   CONDITION:  Stable.   INDICATIONS FOR PROCEDURE:  Laura Griffith is a 77 year old female who  sustained a comminuted mid shaft fracture of her tibia with distal  spiral extension.  The patient is status post total knee arthroplasty.  We discussed preoperatively the risks and benefits of surgery including  infection, nerve injury, vessel injury, DVT, PE, heart attack, stroke,  malunion, nonunion, need for further surgery, and others.  After a  full  discussion, she wished to proceed.   DESCRIPTION OF PROCEDURE:  Laura Griffith was taken to the operating room  where general anesthesia was induced.  She did receive preop  antibiotics.  Her right lower extremity was prepped and draped in the  usual sterile fashion. The 22 hole locking plate was then measured for  length.  We tried to span bone maximally.  We then contoured the plate,  made a proximal and distal incision along the medial aspect of the leg  sliding the plate percutaneously. We then placed pin ultimate screw,  adjusted our reduction, and placed a series of standard and locked  fixation in order to maximally appose the plate to the bone and obtain  and retain appropriate reduction. We did accept some translation as  the  length and rotation appeared appropriate from the fibula and tibia  fractures.  The wounds were copiously irrigated and closed in standard  layered fashion with 2-0 Vicryl and 3-0 nylon.  A sterile gently  compressive dressing was applied and then a night splint.  The patient  was awakened from anesthesia and transported to the PACU in stable  condition.   PROGNOSIS:  Laura Griffith will require a prolonged period of restricted  weight bearing to allow for her fracture to unite, percutaneous  technique should give her a good chance of maintaining vascularity to  these segments and  promoting union, however, given her size and the limited force required  to create this fracture, there remains concern for complications.  Also,  given her previous DVT and PE, she will be placed back on blood thinners  and she continues to have an IVC filter.      Doralee Albino. Carola Frost, M.D.  Electronically Signed     MHH/MEDQ  D:  06/17/2007  T:  06/18/2007  Job:  [id number]   cc:   Claude Manges.  Cleophas Dunker, M.D.

## 2011-04-03 NOTE — Assessment & Plan Note (Signed)
OFFICE VISIT   Laura, Griffith  DOB:  Sep 03, 1933                                        August 26, 2008  CHART #:  [id number]   The patient is a 77 year old woman who I first saw in October 2008 a  year ago for approximately 4.5-5-cm ascending aortic aneurysm.  She was  seen in April and that was still around 5 cm and she now returns for  followup.  She says in the interim, she has been doing well.  She has  not had any new or any changes in her pattern of shortness of breath and  occasional chest pain has been very stable.  She is having less swelling  in her legs.  Overall, she feels she is doing well.   CURRENT MEDICATIONS:  Her Sandrea Hammond has been discontinued.  She is now  receiving B12 shots, otherwise unchanged from April 2009.   PHYSICAL EXAMINATION:  GENERAL:  The patient is a 77 year old white  female, in no acute distress.  She is obese.  VITAL SIGNS:  Her blood pressure is 112/80, pulse 79, respirations are  18, and her ox saturation is 93% on room air.  CARDIAC:  Has a regular rate and rhythm.  Normal S1 and S2.  No rubs,  murmurs, or gallops.  There is no bruits.  LUNGS:  Clear with equal breath sounds bilaterally.   She had an echocardiogram done on August 24, 2008, which showed  essentially normal left ventricular function.  There was mild aortic  valvular regurgitation and mild aortic root dilatation.   The patient is a 77 year old woman with ascending aortic aneurysm.  This  appears unchanged by echo in 68-month followup.  We will plan on seeing  her back in about another 6 months.  We will do a CT angio at that time.  She would appear to be a very poor operative candidate, but if there  is change, we will have to address whether she would in fact be a  candidate for surgery.  Presently, there is no surgical indication.   Salvatore Decent Dorris Fetch, M.D.  Electronically Signed   SCH/MEDQ  D:  08/26/2008  T:  08/27/2008  Job:  [id number]   cc:    Lupita Raider, M.D.  Jonelle Sidle, MD

## 2011-04-03 NOTE — Letter (Signed)
September 01, 2007   Jonelle Sidle, MD  [phone] [address]  [place], [place] [address]   Re:  PHINLEY, SCHALL               DOB:  06-06-1933   Dear Doreatha Martin:   Thank you very much for asking me to see this patient.  I met with her  and her daughter today regarding her ascending aortic aneurysm.  As you  know, she has recently been found on CT scan to have a 4.7 cm ascending  aortic aneurysm.  Actually, the dimensions of this were not well or  clearly documented on her CT reports, but in reviewing her films from  July and then September, there is really no significant change in the  aneurysm, which does again measure about 4.7 cm in diameter, and there  is no indication for surgery at this time.  She has already had an  echocardiogram, which showed mild aortic insufficiency.  I have  recommended to her that we see her back in 6 months with a repeat CT  angio.  I suspect the best option for following her might be to do  annual CT angio as well as annual echo, but to stagger them at 4-month  intervals so that she is being seen by a physician and having one or the  other tests done on a 75-month basis.  Again, thank you for allowing me  to see this patient, and I will plan to see her back in 6 months.   Salvatore Decent Dorris Fetch, M.D.  Electronically Signed   SCH/MEDQ  D:  09/01/2007  T:  09/02/2007  Job:  [id number]   cc:   Lupita Raider, M.D.

## 2011-04-03 NOTE — Discharge Summary (Signed)
Laura Griffith, Laura Griffith               ACCOUNT NO.:  [id number]   MEDICAL RECORD NO.:  [id number]          PATIENT TYPE:  IPS   LOCATION:  4027                         FACILITY:  [facility]   PHYSICIAN:  Ranelle Oyster, M.D.DATE OF BIRTH:  1933/10/07   DATE OF ADMISSION:  06/19/2007  DATE OF DISCHARGE:                               DISCHARGE SUMMARY   ADDENDUM:  Discharge date was adjusted to August 18, instead of August  19, to enable transportation to be provided by family.  Patient remained  medically stable.  Laura Griffith had been arranged for home health therapies,  with next check of INR on Wednesday, August 20.  She was discharged on  2.5 mg of Coumadin, to be completed August 19, and then she would resume  her aspirin therapy at that time.      Mariam Dollar, P.A.      Ranelle Oyster, M.D.  Electronically Signed    DA/MEDQ  D:  07/07/2007  T:  07/07/2007  Job:  [id number]

## 2011-04-03 NOTE — Consult Note (Signed)
Laura Griffith, Laura Griffith               ACCOUNT NO.:  [id number]   MEDICAL RECORD NO.:  [id number]          PATIENT TYPE:  INP   LOCATION:  4705                         FACILITY:  [facility]   PHYSICIAN:  Zenaida Deed. Mayford Knife, M.D.DATE OF BIRTH:  01-03-1933   DATE OF CONSULTATION:  06/15/2007  DATE OF DISCHARGE:                                 CONSULTATION   CHIEF COMPLAINT:  Right tibial fibular fracture.   HISTORY OF PRESENT ILLNESS:  The patient is 77 year old female with  history of osteopenia on Boniva who fell on July 22 and fractured her  right tibia and fibula.  She denies any dizziness or loss of conscious  with the fall.  She states that she just fell.  She was admitted to  [facility] at that time and is transferred to [facility] today  so that she can have surgery here on Tuesday, July 29.  On admission,  she was noted to have low platelets of 138.  A HIT screen was done and  had a positive screening test but a negative confirmatory test.  She has  been on Refludan for DVT prophylaxis since that test.  Of note, she did  have bilateral DVTs well as a saddle pulmonary embolus following an  ankle fracture in 2004 and is status post IVC filter placement.  She  also has a history of paroxysmal atrial fibrillation.  She has had no  other DVTs or PEs.  Coumadin was discontinued this winter as she was  having some falls which were felt to be deconditioning.  She did improve  her strength with physical therapy and was discontinued physical therapy  this winter.  Another issue this hospitalization is urinary tract  infection with cultures preliminarily gram-negative rods, currently  being treated with Cipro.  This was a catheterized specimen.  She is  afebrile and denies dysuria, frequency, abdominal pain or flank pain.  Foley catheter in place.  As far as preoperative evaluation, she had a 2-  D echo with a left ventricular ejection fraction of 55-60% with mild  aortic valve  thickness and mild mitral valve regurgitation.  She does  have a diagnosis pulmonary hypertension from echo in 2004.  She states  she has a history of coronary artery disease greater than 10 years ago.  I was unable to find any documentation of this in one volume of her  chart going back to 1999.  She denies any current chest pain.  She is  able to ambulate at least four blocks.  She does not have steps in her  home and is unable to say whether or not she could go up steps, but she  has no chest pain or dyspnea with exertion with ambulation.   HOME MEDICATIONS:  1. Aspirin 81 mg daily.  2. Atenolol 50 mg daily.  3. Boniva 150-mg tablets once monthly.  4. Cozaar 100 mg once daily.  5. Ditropan XL 5 mg daily.  6. Glucosamine sulfate 500-mg tablets.  7. Hydrochlorothiazide 25 mg daily.  8. Lipitor 20 mg daily.  9. Percocet 5/325 once every 6 hours.  10.Calcium with vitamin D twice a day.  11.Tylenol 325 mg q.i.d.  12.Vitamin E 400 units.  13.Wellbutrin SR 150 mg daily.   PAST MEDICAL HISTORY:  1. History of pulmonary embolism and DVT following surgery for ankle      fracture in 2004.  2. Chronic renal insufficiency, baseline creatinine 1.6 to 1.8.  3. Osteopenia with T score of 1, heel bone densitometry in 2003.  4. Obesity.  5. Osteoarthritis.  6. Hypertension.  7. Depression.  8. Pulmonary hypertension.  9. Pulmonary nodules currently being followed by CT.   PAST SURGICAL HISTORY:  1. Cholecystectomy.  2. Cataract surgery.  3. Hysterectomy.  4. Status post IVC filter placement in 2004 following DVT.   FAMILY HISTORY:  Mom with breast cancer.   SOCIAL HISTORY:  She lives alone but has her cell phone with her at all  times.  She has children who live three blocks away and are very  supportive and involved in care.  She denies any alcohol or history of  tobacco use.  She has been widowed for 14 years.  She drives and  performs all ADLs.   REVIEW OF SYSTEMS:  She denies  any fever, chest pain, syncope, dyspnea  on exertion, abdominal pain, melena, hematochezia, hematuria, unusual  weight change, fevers, night sweats.  GENERAL:  She is very obese but in  no acute distress.  HEENT:  Mucous membranes are moist.  NECK:  She does  have some JVD to her jaw when supine but disappears to inspiration.  LUNGS:  Clear to auscultation bilaterally.  No wheezes, rales or  rhonchi.  HEART:  2/6 systolic ejection murmur at right upper sternal  border but radiating throughout most of the precordium.  ABDOMINAL EXAM:  Obese.  Positive bowel sounds, soft, nontender.  EXTREMITIES:  Right lower extremity is in cast.  She has full FHL and  EHL strength.  She has pedal edema on her left lower extremity with some  pain with movement but no ecchymosis.  She has brisk capillary refill.  NEUROLOGICAL:  She is alert and oriented x3.  Cranial nerves II-XII  grossly intact.   LABORATORY DATA:  This hospitalization, urine culture significant for  greater than 100,000 colonies of gram-negative rods.  Current platelet  count 139, has been as low as 111.  HIT antibody screen was positive,  but the confirmatory test was negative.  Hemoglobin was initially 12.3  on admission but has been trending down to 10.6.  White blood cell count  6.5.  EKG shows normal sinus rhythm but widened QRS.  D-dimer was  elevated at 13.   IMAGES:  Chest x-ray on July 22 showed cardiac enlargement.  CT angio of  the chest July 23 showed no pulmonary embolus; there is coronary artery  calcification.  There is a small cluster of nodules in the posterior  segment of right upper lobe felt to be either infectious or post  infectious.  There also are clustered airspace nodules in the right  lower lobe that measure up to 1 cm concerning for infection versus  malignancy.  Recommend following up in 2 to 3 months.  She has bilateral  renal calculi with staghorn appearance on the left.  Cardiac enlargement  with an  ascending aortic aneurysm and pulmonary atrial hypertension are  noted as well.  X-rays of her right lower extremity show acute oblique  displaced fracture of the distal right tibia and fibula.   ASSESSMENT/PLAN:  1. Tibial fibular fracture  per orthopedics.  She is to go to the OR on      Tuesday.  Will change to heparin for deep vein thrombosis      prophylaxis now that HIT confirmatory test is negative.  She will      need physical therapy and occupational therapy evaluation after      surgery to address home needs and/or either rehab.  She has history      of frequent falls and will likely need rehab or short-term skilled      nursing facility.  Pain is currently well-controlled on Percocet.      She does have questionable history of coronary artery disease      greater than 10 years ago.  She currently has good functional      status.  She has had an echocardiogram which showed no hypokinesis.      Will continue her on her beta blocker during surgery.  2. Urinary tract infection currently on Cipro.  Will await urine      culture and sensitivities.  She is afebrile and asymptomatic      currently.  3. Hypertension.  Her blood pressure is currently at goal.  Cozaar is      being held due to CT angiogram as well as future surgery.  Will      continue hydrochlorothiazide .  Her atenolol was increased 100 mg      to maintain blood pressure as well as to lower pulse for surgery.      Will also check fasting lipid panel while she is here to further      risk stratify for coronary artery disease and continue Lipitor.  4. History of pulmonary embolism.  This was a provoked deep vein      thrombosis with bilateral deep vein thrombosis and saddle pulmonary      embolus following surgery.  She has an IVC filter placed.  Will      start heparin for prophylaxis.  5. Pulmonary nodules.  Differential includes infection versus      malignancy.  Of note, she did have a recent sinus infection and  was      treated with azithromycin as an outpatient a couple of weeks ago.      Those symptoms have resolved, so we are presuming that she has had      outpatient treatment for possible infection.  Will need outpatient      CT scan in 2-3 months for follow-up.  Will have a low threshold for      pursuing further workup sooner.  She has had no history of smoking      or constitutional symptoms.  6. Osteopenia.  Will continue monthly Boniva and calcium with vitamin      D.  She has had multiple fractures.  To consider repeat DEXA      scanning at some point as an outpatient.  7. Depression.  She is stable and doing well on Wellbutrin  8. Anemia.  Her hemoglobin is trending down gradually.  Last      colonoscopy was in 2000.  Will look at old records and find out      what her baseline hemoglobin is.  Will heme check stools.  Will      also need to follow her hemoglobin closely given the possible      history of coronary artery disease, and she is undergoing surgery      soon.  9. Chronic  renal insufficiency.  Her renal function is currently at      baseline.  Will continue to hold Cozaar during the perioperative      period.  10.Pulmonary hypertension.  Her volume status is currently stable.      Question whether or not this is a result of prior pulmonary embolus      or if it is due to obesity hypoventilation and sleep apnea.  Will      follow pulse oximetry during the night and obtain records from her      hospital admission in 2004.  11.Paroxysmal atrial fibrillation.  Currently in sinus rhythm.      Coumadin held due to fall risk.      Benn Moulder, M.D.  Electronically Signed      Zenaida Deed. Mayford Knife, M.D.  Electronically Signed    MR/MEDQ  D:  06/15/2007  T:  06/16/2007  Job:  [id number]

## 2011-04-03 NOTE — H&P (Signed)
NAMEMARGARINE, GROSSHANS               ACCOUNT NO.:  [id number]   MEDICAL RECORD NO.:  [id number]          PATIENT TYPE:  IPS   LOCATION:  NA                           FACILITY:  [facility]   PHYSICIAN:  Ranelle Oyster, M.D.DATE OF BIRTH:  [birth year]   DATE OF ADMISSION:  DATE OF DISCHARGE:                              HISTORY & PHYSICAL   ADMITTING PHYSICIAN:  Ranelle Oyster, M.D.   ORTHOPEDIST:  Doralee Albino. Carola Frost, M.D.   PRIMARY CARE PHYSICIAN:  Outpatient primary care clinic.   HISTORY OF PRESENT ILLNESS:  Laura Griffith is a 77 year old Caucasian female  with a history of right total knee replacement in 2004 when she was on  the rehabilitation unit for recovery.  She also has a history of  pulmonary embolism along with deep venous thrombosis requiring IVC  filter in 2004.   The patient was admitted June 13, 2007 after a fall in her bathroom  without loss of consciousness.  She reports that she just lost her  balance and fell.  She sustained a right tib-fib fracture.  She was seen  by Dr. Mayford Knife from internal medicine and was found to have a platelet  count of 114,000 after being placed on IV heparin.  CT of the chest was  negative for pulmonary embolism.  Her IV heparin was discontinued and  her platelet count rebounded to 158,000.  She was cleared for surgery  and underwent right open reduction internal fixation of the right tib-  fib fracture June 17, 2007 performed by Dr. Carola Frost.  She was placed on  Coumadin for DVT prophylaxis.  She is allowed nonweightbearing on the  right lower extremity.  She was found to have a Klebsiella urinary tract  infection and ciprofloxacin was added 06/17/2007.   The patient was evaluated by the rehabilitation physicians and felt to  be an appropriate candidate for inpatient rehabilitation.   REVIEW OF SYSTEMS:  Positive for lumbago and depression.   PAST MEDICAL HISTORY:  1. Paroxysmal atrial fibrillation.  2. Hypertension.  3. Dyslipidemia.  4. Depression.  5. Coronary artery disease.  6. Right total knee replacement in 2004.  7. Right ankle fracture in 2004.  8. Prior cholecystectomy.  9. Prior tonsillectomy and adenoidectomy.  10.Cataract surgery.  11.Chronic renal insufficiency with baseline creatinine of 1.5.   FAMILY HISTORY:  Positive for coronary artery disease.   SOCIAL HISTORY:  The patient lives alone, but has children in the area  who can check on her as needed.  She does not use alcohol or tobacco.  She lives in a one level home with two steps to enter.   FUNCTIONAL HISTORY PRIOR TO ADMISSION:  Independent using a walker and  doing some driving.   ALLERGIES:  SULFA.   MEDICATIONS PRIOR TO ADMISSION:  1. Aspirin 81 mg daily.  2. Atenolol 50 mg daily.  3. Boniva 150 mg monthly.  4. Cozaar 100 mg daily.  5. Ditropan XL 5 mg daily.  6. Wellbutrin sustained release 150 mg daily.  7. Vitamin E 400 international units daily.  8. Os-Cal b.i.d.  9. Lipitor 20  mg daily.  10.Hydrochlorothiazide 25 mg daily.   LABORATORY DATA:  Recent hemoglobin was 10.1 with hematocrit 30.1,  platelet count of 158,000 and white count of 6.2.  Recent INR was 1.1 as  of June 19, 2007.  Recent sodium was 137, potassium 4.5, chloride 106,  CO2 26, BUN 17, creatinine 1.1.   PHYSICAL EXAMINATION:  GENERAL APPEARANCE:  A reasonably well-appearing,  obese, adult female lying in bed with extensive brace on her right lower  extremity with Ace wrap.  VITAL SIGNS:  Blood pressure 116/57, pulse 74, respiratory rate 20,  temperature 98.6.  HEENT:  Normocephalic, atraumatic.  CARDIOVASCULAR:  Irregular rate and rhythm.  S1, S2 without murmurs.  ABDOMEN:  Soft, obese, nontender with positive bowel sounds.  CHEST:  Lungs were clear to auscultation bilaterally.  NEUROLOGIC:  Alert and oriented x3.  Cranial nerves II-XII are intact.  EXTREMITIES:  Bilateral upper extremity exam showed 5-/5 strength  throughout.  Bulk and tone were normal.   Reflexes were 2+ and  symmetrical.  Examination of the left lower extremity shows strength  4+/5.  Bulk and tone were normal.  Examination of the right lower  extremity showed large brace in place with Ace wrap around the leg.   IMPRESSION:  1. Status post fall with resultant right tibia-fibular fracture,      status post open reduction internal fixation June 17, 2007.  2. Recent transient thrombocytopenia, improved.   Presently the patient has deficits in activities of daily living,  transfers and ambulation related to the above noted tib-fib fracture  with nonweightbearing allowed to the right lower extremity.   PLAN:  1. Admit to the rehabilitation unit for daily therapies to include      physical therapy for range of motion, strengthening, bed mobility,      transfers, pre gait training, gait training and equipment      evaluation.  2. Occupational therapy for range of motion, strengthening, ADLs,      cognitive/perceptual training, splinting and equipment evaluation.  3. Rehab nursing for skin care, wound care, bowel and bladder training      as necessary.  4. Case management to assess home environment, assist with discharge      planning and arrange for appropriate followup care.  5. Social worker to assess family support and assist in discharge      planning.  6. Continue regular diet.  7. Check admission lab including CBC and C-Met in a.m. June 20, 2007.  8. Monitor hypertension on atenolol 100 mg p.o. daily,      hydrochlorothiazide 25 mg p.o. q.h.s., Cozaar 100 mg p.o. daily.  9. Continue Zocor 40 mg p.o. q.h.s.  10.Coumadin per pharmacy.  11.Senokot S 2 tablets p.o. q.h.s.  12.Wellbutrin sustained release 150 mg p.o. b.i.d.  13.Subcu Lovenox 30 mg q.12h. until INR consistently greater than 2.0      on Coumadin.  14.Discharge IV fluid if not already done.  15.Old EKG to chart.  16.Routine turning to prevent skin breakdown.  17.Ciprofloxacin 250 mg p.o. b.i.d. x5  more days then stop.  18.Oxycodone 5 mg 1-2 tablets p.o. q.4h. p.r.n. for pain and Tylenol      325 mg 1-2 tablets p.o. q.4h. p.r.n. for pain.  19.Routine dry dressing to the right lower extremity change daily.   PROGNOSIS:  Good.   ESTIMATED LENGTH OF STAY:  Ten to fifteen days.   GOALS:  Modified independent with wheelchair mobility.  Stand by  assistance to modified independent  with ADLs and stand by assist to  modified independent transfers with min assist short distance  ambulation.     ______________________________  Ellwood Dense, M.D.      Ranelle Oyster, M.D.  Electronically Signed    DC/MEDQ  D:  06/19/2007  T:  06/19/2007  Job:  [id number]

## 2011-04-03 NOTE — Assessment & Plan Note (Signed)
OFFICE VISIT   [name], [name]  DOB:  [birth year]                                        March 09, 2008  CHART #:  [id number]   HISTORY:  Laura Griffith is a 77 year old woman who was seen in the office in  October 2008, with an ascending aortic aneurysm.  This was about 4.7 to  5.0 cm in diameter, depending upon where it was measured.  She  subsequently had an echocardiogram in January that showed some mild  aortic insufficiency.  The aortic root did not appear as large by the  echocardiogram as it had on CT.  She now returns for a six-month  followup.  She states that in the interim, she has been doing well.  She  has not had any new chest pain, shortness of breath or other symptoms.  She does have chronic problems with multiple previous orthopedic  procedures and obesity.   PHYSICAL EXAMINATION:  GENERAL:  Laura Griffith is a 77 year old white  female.  She is obese, in no acute distress.  VITAL SIGNS:  Blood pressure 101/71, pulse 66, respirations 18, oxygen  saturation 95% on room air.  LUNGS:  Clear with equal breath sounds bilaterally.  HEART:  A regular rate and rhythm.  Normal S1 and S2.  I do not hear a  murmur.   LABORATORY DATA:  A CT scan is reviewed and shows a stable ascending  aorta which is about 5 cm in diameter.  It is unchanged from her scan in  September 2008.   RECOMMENDATIONS:  I recommended to Laura Griffith that we check a 2-D  echocardiogram in six months to follow up on her aortic valve and also  check on the aortic root at that time.  We will plan to do another CT  angio in one year.  We can continue to alternate those exams.   Laura Griffith, M.D.  Electronically Signed   SCH/MEDQ  D:  03/09/2008  T:  03/09/2008  Job:  [id number]   cc:   Jonelle Sidle, MD  Lupita Raider, M.D.

## 2011-04-03 NOTE — Discharge Summary (Signed)
Laura Griffith, Laura Griffith               ACCOUNT NO.:  [id number]   MEDICAL RECORD NO.:  [id number]          PATIENT TYPE:  IPS   LOCATION:  4027                         FACILITY:  [facility]   PHYSICIAN:  Mariam Dollar, P.A.  DATE OF BIRTH:  Dec 06, 1932   DATE OF ADMISSION:  06/19/2007  DATE OF DISCHARGE:  07/08/2007                               DISCHARGE SUMMARY   DISCHARGE DIAGNOSES:  1. Right tibial-fibular fracture, status post open reduction and      internal fixation, June 17, 2007.  2. Coumadin for deep vein thrombosis prophylaxis.  3. Hypertension.  4. Hyperlipidemia.  5. Klebsiella urinary tract infection, resolved.  6. Depression.  7. Chronic renal insufficiency, with a baseline creatinine of 1.5.  8. Right total knee replacement in 2004.  9. History of pulmonary emboli after deep vein thrombosis in 2004 with      inferior vena cava filter.   HISTORY OF PRESENT ILLNESS:  This is a 77 year old female with history  of right total knee replacement in 2004, received inpatient rehab  services for, pulmonary emboli with IVC filter in 2004, now admitted  June 13, 2007 after a fall without loss of consciousness.  Sustained a  right tibial-fibular fracture.  Followup with medicine team.  CT of the  chest negative for pulmonary emboli.  Initially on intravenous heparin,  discontinued, with platelet count 158,000, monitored.  Underwent open  reduction and external fixation June 17, 2007 per Dr. Carola Frost.  Placed on  Coumadin for deep vein thrombosis prophylaxis, nonweightbearing.  Placed  on Cipro June 17, 2007 for Klebsiella urinary tract infection.   PAST MEDICAL HISTORY:  See discharge diagnoses.   No alcohol or tobacco.   ALLERGIES:  SULFA.   SOCIAL HISTORY:  Lives alone.  Children in the area to check as needed.  One-level home, two steps to entry.   MEDICATIONS PRIOR TO ADMISSION:  1. Aspirin 81 mg daily.  2. Atenolol 50 mg daily.  3. Boniva 150 mg every month.  4. Cozaar 100  mg daily.  5. Ditropan 5 mg daily.  6. Hydrochlorothiazide 25 mg daily.  7. Lipitor 20 mg daily.  8. Os-Cal twice daily.  9. Vitamin E 400 units daily.  10.Wellbutrin 150 mg daily.   REHABILITATION HOSPITAL COURSE:  The patient was admitted to inpatient  rehab services, with therapies initiated on a 3-hour daily basis  consisting of physical therapy, occupational therapy, and rehabilitation  nursing.  The following issues were addressed during the patient's  rehabilitation stay.  Pertaining to Laura Griffith' right tibia-fibula  fracture, she had undergone open reduction and internal fixation July  29, 200.  Surgical site healing nicely.  Neurovascular sensation intact.  She was nonweightbearing and would follow up with Dr. Carola Frost.  She  remained on Coumadin for deep vein thrombosis prophylaxis.  Subcutaneous  Lovenox was ongoing until INR greater than 2.  Latest INR of 2.8.  She  will complete Coumadin protocol, followed by [facility],  and resume aspirin therapy after Coumadin completed.  Her blood  pressures were monitored.  She was on atenolol 50 mg  daily, and Cozaar  50 mg daily.  Hydrochlorothiazide held for a short time for a sodium of  134, and also monitoring of chronic renal insufficiency, with baseline  creatinine of 1.5, and her latest creatinine of 1.28.  She would follow  up with her primary M.D.'s at the outpatient clinic at [facility].  She  remained on Wellbutrin for a history of depression, which was without  issue during her rehabilitation stay.  Zocor ongoing for hyperlipidemia.  She would resume her Lipitor at time of discharge.  Pain control with  the use of oxycodone on a limited regimen, and she responded quite well  to this.  Overall, for her functional mobility she was minimal assist  for ambulation and transfers, needing some assistance for lower body  dressing.  Overall, her strength and endurance had greatly improved.  She was encouraged with  her overall progress, and ultimate plan was to  be discharged to home.   Latest labs showed an INR of 2.8.  Latest hemoglobin of 11, hematocrit  33, platelets 192,000.  Sodium 134, potassium 3.5, BUN 20, creatinine  1.28.   DISCHARGE MEDICATIONS AT TIME OF DICTATION:  1. Coumadin 2.5 mg daily, to be completed July 18, 2007.  2. Wellbutrin 150 mg twice daily.  3. Ditropan 5 mg at bedtime.  4. Lipitor 20 mg daily.  5. Reglan 5 mg 3 times daily.  6. Atenolol 50 mg daily.  7. Cozaar 50 mg daily.  8. Oxycodone immediate release 5 mg 1 or 2 tablets q.4 h. as needed      for pain, dispense 90 tablets.  9. Boniva monthly.  10.Os-Cal twice daily.  11.Vitamin E 400 units daily.  12.Multivitamin daily.   DIET:  Regular.   SPECIAL INSTRUCTIONS:  Nonweightbearing right lower extremity.  Home  health nurse per [facility] to complete Coumadin  protocol.  She will follow with Dr. Carola Frost at the outpatient orthopedic  office as advised, outpatient clinic for medical management.  She was  advised to resume her aspirin therapy after Coumadin completed.      Mariam Dollar, P.A.    DA/MEDQ  D:  07/07/2007  T:  07/07/2007  Job:  [id number]   cc:   Doralee Albino. Carola Frost, M.D.  [facility]

## 2011-04-03 NOTE — Consult Note (Signed)
NAMECEYLIN, Laura Griffith               ACCOUNT NO.:  [id number]   MEDICAL RECORD NO.:  [id number]          PATIENT TYPE:  INP   LOCATION:  4705                         FACILITY:  [facility]   PHYSICIAN:  Doralee Albino. Carola Frost, M.D. DATE OF BIRTH:  06-Aug-1933   DATE OF CONSULTATION:  06/16/2007  DATE OF DISCHARGE:                                 CONSULTATION   REASON FOR CONSULTATION REQUEST:  Right tib-fib fracture distal to a  total knee prosthesis.   BRIEF HISTORY OF PRESENTATION:  Laura Griffith is a 77 year old white female  who sustained a right tibia fracture on ground-level fall.  She is  status post right total knee arthroplasty and also ORIF of a right ankle  fracture which is complicated by DVT and PE, with IVC filter placement  in 2004.  She has been followed by Dr. Cleophas Dunker.  She was in her  bathroom when she turned and fell, sustaining the injury.  She denied  any antecedent chest pain, shortness of breath, dizziness, or loss of  consciousness.  Had immediate onset right leg pain and deformity.  No  bleeding or break in the skin.  Pain has been improving since that time  in her leg splint.  She denies any associated numbness or tingling.   PAST MEDICAL HISTORY:  1. Notable for the above.  2. Hypertension.  3. CAD, with previous silent MI.  4. Obesity.  5. Depression.  6. Hyperlipidemia.  7. Atrial fibrillation.   SOCIAL HISTORY:  No tobacco, no alcohol.  The patient is accompanied by  her daughter.  The patient is a widow with 8 children, who lives by  herself in a one-story house.  She uses a cane or walker for  mobilization.   FAMILY MEDICAL HISTORY:  Noncontributory.  Mother died of CAD.   PAST SURGICAL HISTORY:  1. Right total knee arthroplasty.  2. ORIF right ankle.  3. Tonsillectomy.  4. IVC filter.  5. Cataract surgery.   SOCIAL HISTORY:   DRUG ALLERGIES:  SULFA.   REVIEW OF SYSTEMS:  She does wear glasses, has dentures.  Occasional  headache.  Occasional urinary  urgency.  Bilateral shoulder pain, and  some occasional and numbness.   PRIMARY CARE:  She uses [facility].   MEDICATIONS:  Were reviewed and included in the chart.  Cozaar, Lipitor,  Ditropan, hydrochlorothiazide, bupropion, atenolol, and Tylenol.   PHYSICAL EXAMINATION:  HEENT:  She is normocephalic, atraumatic.  GENERAL:  Is appropriate for stated age, quite pleasant.  UPPER EXTREMITIES:  No focal tenderness or ecchymosis about the elbows  or wrists bilaterally.  ABDOMEN:  Soft, nontender, obese.  LOWER EXTREMITIES:  Right lower extremity:  Palpable pulse.  Some  ecchymosis.  Intact deep peroneal, superficial peroneal, and tibial  nerve sensory function.  She is able to flex and extend her greater and  lesser toes.  She is immobilized in a splint.  Contralateral lower  extremity intact, DPN, SPN, and TN.  Sensorimotor function:  DP 2+.  No  focal ecchymosis, crepitus, blocked motion, or diminished strength  noted.  No lymphadenopathy noted.  X-RAYS:  AP and lateral views demonstrate a displaced midshaft tibia  fracture, with an apparently well-fixed total knee proximal to this.  There is severe DJD about the her tibiotalar joint.  No widening of the  syndesmosis.  Previous fixation of the lateral malleolus present.   ASSESSMENT:  Tibia-fibula fracture below a well-fixed prosthesis, with  severe tibiotalar arthritis which is nonetheless asymptomatic.   PLAN:  I spent a good deal of time discussing the options with Laura Griffith  and her daughter, particularly in light of the tibiotalar arthritis.  The patient repeatedly denies any antecedent ankle pain and uses the  cane for primarily balance.  As such, I have recommended percutaneous  plating if feasible verses open reduction, and I will attempt to span  above or near to the total knee prosthesis.  I discussed with her the  possibility of infection, nerve injury, vessel injury, failure of the  fracture to unite,  need for further surgery, malunion, nonunion,  decreased range of motion, subsequent symptomatic tibiotalar arthritis,  and need for fusion.  We did discuss the possibility of tibiotalar  calcaneal fusion, treating the tibia fracture with a retrograde rod.  However, I feel that this may further exacerbate the forces between the  implant and the tibial stem in the proximal tibia by taking away some of  the ankle motion.  After full discussion, they wish to proceed .  I will  discuss these findings with Dr. Sherlean Foot, and per his request would be  willing to take Laura Griffith to my service for definitive treatment.      Doralee Albino. Carola Frost, M.D.  Electronically Signed     MHH/MEDQ  D:  06/16/2007  T:  06/17/2007  Job:  [id number]

## 2011-04-03 NOTE — Assessment & Plan Note (Signed)
[facility]                            CARDIOLOGY OFFICE NOTE   NAME:Laura Griffith, Laura Griffith                      MRN:          [id number]  DATE:08/18/2007                            DOB:          [birth year]    REFERRING PHYSICIAN:  Lupita Raider, M.D.   REASON FOR CONSULTATION:  Thoracic aortic aneurysm.   HISTORY OF PRESENT ILLNESS:  Laura Griffith is a 77 year old woman with  multiple medical problems including morbid obesity, possible paroxysmal  atrial fibrillation, hypertension, hyperlipidemia, possible history of  previous myocardial infarction based on very limited information,  chronic renal insufficiency with baseline creatinine of 1.5, and  previous pulmonary embolism apparently complicating a right ankle  fracture with surgery approximately 4 years ago status post inferior  vena cava filter.  She was recently hospitalized in July following a  right tibial shaft fracture that occurred when she fell (no syncope).  She underwent general anesthesia and had an open reduction internal  fixation of the right tibial fracture by Dr. Carola Frost on July 28th.  She  tolerated this relatively well.  She continues to have this area braced  and has had a no weightbearing status since then, using a wheelchair.  She is working through physical therapy at the present time.  Along the  way she had a CT scan of the chest which apparently revealed a pulmonary  nodule and in followup of this, she was noted to have evidence of a  thoracic aortic aneurysm (although no nodule).   I have a copy of the report of the CT scan of her chest from September  15th.  This study describes no obvious pulmonary nodule with resolution  of previous right lower lobe infiltrate.  She was noted to have an  ascending aortic aneurysm that is measured at approximately 5cm.  Splenic artery aneurysm and splenic cysts were also noted as well as  left renal calculi.  Laura Griffith denies any active or  recent chest pain  and is not aware of any prior diagnosis of aneurysmal disease.   ALLERGIES:  No known drug allergies.   PRESENT MEDICATIONS:  1. Atenolol 50 mg p.o. daily.  2. Boniva 150 mg p.o. monthly.  3. Cozaar 100 mg p.o. daily.  4. Ditropan XL 500 mg p.o. daily.  5. Wellbutrin 150 mg p.o. daily.  6. Vitamin E.  7. Os-Cal with vitamin D.  8. Lipitor 20 mg p.o. daily.  9. Hydrochlorothiazide 25 mg p.o. daily.  10.She also uses aspirin intermittently.   PAST MEDICAL HISTORY:  As detailed above.  The patient states that she  has undergone:  1. A right total knee replacement approximately 19 years ago.  2. She had a right ankle fracture resulting in fusion approximately 4      years ago and this was complicated by pulmonary embolus requiring      placement of inferior vena cava filter.  She was no Coumadin at      that time as well as recently after her tibial surgery, although      has not been on Coumadin for the last  few weeks.  3. She is status post hysterectomy.  4. Cholecystectomy.   The details of her prior heart attack are not entirely clear.  She may  have had a cardiac catheterization many years ago.  There is no record  of this at this time.  She was noted to have coronary calcifications on  her CT scan of the chest done recently.   REVIEW OF SYSTEMS:  As described in the history of present illness.  The  patient is using a wheelchair due to a non-weightbearing status.  She  suffered with anxiety and depression in the past.  She complains of  fatigue.  She has no clear exertional chest pain and has NYHA Class II  dyspnea on exertion that has been stable.  She has chronic lower  extremity edema.   FAMILY HISTORY:  Reviewed, noncontributory for obvious premature  cardiovascular disease.  The patient's father died at age 64 and her  mother died at age 7.   SOCIAL HISTORY:  The patient is widowed.  She has 8 children.  No  tobacco use history.  No alcohol  use.   PHYSICAL EXAMINATION:  VITAL SIGNS:  Blood pressure 140/90, heart rate  is 66.  The patient was not weighed.  GENERAL:  She is morbidly obese.  HEENT:  Conjunctivae and lids are normal.  Oropharynx is clear.  NECK:  Supple.  No elevated jugular venous pressure .  No loud bruits.  No thyromegaly.  LUNGS:  Generally clear.  Diminished breath sounds noted.  No wheezing.  CARDIAC:  Reveals a regular rate and rhythm.  Soft S4.  No S3 gallop.  A  soft systolic murmur heard at the right base.  Second heart sound is  preserved.  No pericardial rub.  ABDOMEN:  Obese.  Unable to adequately palpate organs.  Bowel sounds  present.  No tenderness.  EXTREMITIES:  Exhibit significant adipose tissue.  Chronic-appearing  edema in the lower extremities, non-pitting.  SKIN:  Warm and dry.  Distal pulses are 1 plus.  MUSCULOSKELETAL:  No kyphosis is noted.  NEUROPSYCHIATRIC:  The patient is alert and oriented x3.   A recent echocardiogram from July, demonstrated a left ventricular  ejection fraction of 55-60%, with diastolic dysfunction, mild aortic  regurgitation was noted with mildly thickened valve.  There was also  mild aortic root dilatation.  By this particular study, the root  dimension was 39-mm.   A 12-lead electrocardiogram today shows sinus rhythm at 86 beats per  minute with a left bundle branch block pattern, nonspecific ST-T wave  changes.  I do not have any other recent tracings for comparison.   IMPRESSION:  1. Ascending thoracic aortic aneurysm based on recent CT scan of the      chest, not clearly symptomatic and measuring approximately 5cm      based on report.  The patient had evidence of mild aortic root      dilatation at 39-mm by recent echocardiography with a mildly      thickened aortic valve, mild aortic regurgitation, and overall      normal left ventricular systolic function.  It is not entirely      clear that this is in need of surgical intervention at this  time      but clearly needs to be followed and I have made arrangements to      have the patient referred to [facility] for      further followup.  Certainly if and when this becomes a surgical      issue, we can assist with presurgical evaluation.  The main goal at      this time would be better blood pressure control.  I agree with      beta-blocker use.  She is on a diuretic and a reasonable dose of      Cozaar.  May need to consider an additional agent such as Norvasc      for tighter blood pressure control along the way.  2. I will arrange followup over the next few months so that we can      review the surgical consultation.  Further plans to follow.     Jonelle Sidle, MD  Electronically Signed    SGM/MedQ  DD: 08/18/2007  DT: 08/18/2007  Job #: [id number]   cc:   Lupita Raider, M.D.

## 2011-04-03 NOTE — Assessment & Plan Note (Signed)
OFFICE VISIT   Laura Griffith, Laura Griffith  DOB:  1933/01/29                                        January 13, 2009  CHART #:  [id number]   The patient is a 77 year old woman being followed for ascending aortic  aneurysm.  I last saw her on October 2009, at which time it was stable.  She states in the interim, she has not had any hospitalizations.  She  still has chronic chest pains and shortness of breath, which did not  change in pattern over that time frame.  She still has swelling in her  legs and overall, she feels that she is doing about the same as she was  6 months ago.   PAST MEDICAL HISTORY:  Reviewed and includes morbid obesity,  hypertension, hyperlipidemia, previous pulmonary embolus, multiple  orthopedic procedures, nephrolithiasis, and coronary artery disease with  a history of myocardial infarction.   CURRENT MEDICATIONS:  1. Wellbutrin 150 mg b.i.d.  2. Ditropan 5 mg daily.  3. Atenolol 50 mg daily.  4. Cozaar 100 mg daily.  5. Os-Cal with D 1 b.i.d.  6. Vitamin E 400 international units daily.  7. Aspirin 81 mg daily.  8. B12 shots monthly.  9. Hydrochlorothiazide 25 mg daily.  10.Simvastatin 40 mg daily.  11.Fish oil 1 tablet b.i.d.   ALLERGIES:  She is allergic to SULFA.   No change in her family or social histories.   REVIEW OF SYSTEMS:  See HPI.   PHYSICAL EXAMINATION:  GENERAL:  The patient is a 77 year old woman, in  no acute distress.  VITAL SIGNS:  Blood pressure is 166/87, pulse is 70, respirations are  18.  Her ox saturation is 94% on room air.  LUNGS:  Have diminished breath sounds bilaterally.  CARDIAC:  Has a regular rate and rhythm.  Normal S1 and S2.  I do not  hear any murmur.   CT angio of the chest is reviewed.  There has been no change in size of  the aorta.  It is still reporting this is 5 cm in diameter, although  looking at the exact size on coronal views were yet more of a true cross-  section, it really  appears to be more in the range of 4.3-4.5 with a 5  cm coming for relatively tangential slice.  The aorta has curves in its  ascending portion.   IMPRESSION:  The patient is a 77 year old woman with multiple  comorbidities being followed for an ascending aortic aneurysm.  This is  not changed in size over the 2 years, which I have been following her.  Likely, we will continue with echocardiograms at 6 months and CT angio  is 12 months.  To follow this, it is still unlikely that she would be a  real candidate for resection should this enlarge, but we can cross that  bridge when we will become to it.  She will follow up with Dr. Diona Browner  in 6 months and have an echo done at that time.  I will see her back in  the year with a CT angio of the chest.   I did question about her blood pressure.  She says it is up and down  frequently.  She has just gathered all the CT scan.  She had a great  deal of discomfort  related to holding her arms overhead, which may be  the reason that she has elevated blood pressure today.  I can make any  adjustments to her medication regimen at this time.   Salvatore Decent Dorris Fetch, M.D.  Electronically Signed   SCH/MEDQ  D:  01/13/2009  T:  01/13/2009  Job:  [id number]   cc:   Jonelle Sidle, MD  Lupita Raider, M.D.

## 2011-04-03 NOTE — Assessment & Plan Note (Signed)
[facility]                            CARDIOLOGY OFFICE NOTE   NAME:Laura Griffith, Campton                      MRN:          [id number]  DATE:10/27/2007                            DOB:          [birth year]    PRIMARY CARE PHYSICIAN:  Dr. Lupita Raider.   REASON FOR VISIT:  Followup thoracic aortic aneurysm.   HISTORY OF PRESENT ILLNESS:  I saw Laura Griffith back in September and  referred her to Dr. Dorris Fetch with findings of a thoracic aortic  aneurysm that was noted incidentally during a previous hospitalization.  She had also been noted to have a mildly dilated aortic root measuring  39 mm by echocardiography.  Her screening CT scan of the chest  demonstrated approximately 5 cm thoracic aortic aneurysm and Dr.  Dorris Fetch saw the patient and discussed the situation with her and her  daughter.  He saw her in October and her findings at that time included  a 4.6 to 4.7 cm ascending thoracic aortic aneurysm with mild aortic  insufficiency.  It was felt that surgery was not needed at that point,  although clearly followup was indicated.   Laura Griffith denies having any significant chest pain.  She is more mobile  compared to her last visit, using a walker at home instead of wheelchair  all the time.  Her electrocardiogram is stable showing an  interventricular conduction delay with sinus rhythm and poor anterior R  wave progression as noted previously.  I reviewed her medications today  and note that her blood pressure is much better controlled.   ALLERGIES:  SULFA DRUGS.   PRESENT MEDICATIONS:  1. Atenolol 50 mg p.o. daily.  2. Boniva 150 mg p.o. daily.  3. Cozaar 100 mg p.o. daily.  4. Ditropan XL 5 mg p.o. daily.  5. Wellbutrin 150 mg p.o. daily.  6. Vitamin E.  7. Os-Cal.  8. Lipitor 20 mg p.o. daily.  9. Hydrochlorothiazide 25 mg p.o. daily.   REVIEW OF SYSTEMS:  As described in the history of present illness.   EXAMINATION:  Blood pressure  today is 125/82, heart rate 90, weight 246  pounds.  Patient is comfortable and in no acute distress.  NECK:  Reveals no elevated jugular venous pressure.  LUNGS:  Clear without labored breathing, somewhat diminished breath  sounds.  CARDIAC:  Reveals a regular rate and rhythm, soft systolic murmur at the  base, preserved S2.  EXTREMITIES:  Show chronic appearing edema, non-pitting.   IMPRESSION AND RECOMMENDATIONS:  1. Ascending thoracic aortic aneurysm measuring approximately 4.7 cm      and followed now by Dr. Dorris Fetch.  His plan was for a visit in 6      months and repeat CT scan of the chest at that time interspersed      with an echocardiogram on alternating 6 month intervals.  I will      therefore go ahead and schedule a 2D echocardiogram and will plan      to see her back in 1 year's time, alternating my visits with his.      She  will otherwise continue to see Dr. Clelia Croft.  2. Patient requested an influenza vaccine today.     Jonelle Sidle, MD  Electronically Signed    SGM/MedQ  DD: 10/27/2007  DT: 10/27/2007  Job #: [id number]   cc:   Lupita Raider, M.D.  Salvatore Decent Dorris Fetch, M.D.

## 2011-04-03 NOTE — Assessment & Plan Note (Signed)
OFFICE VISIT   KRISTEE, ANGUS  DOB:  1933-07-10                                        February 14, 2010  CHART #:  [id number]   HISTORY:  The patient is a 77 year old woman who has been followed in  our office since 2008 with a 5-cm ascending aorta.  She was last seen in  the office in February 2010, at which time there had been no interval  change and symptomatically she was stable as well.  She has chronic  chest pain, which she says only happens occasionally and she also has  chronic shortness of breath with exertion, which is unchanged.  There  had been numerous medication changes since her last visit.   PAST MEDICAL HISTORY:  Ascending aortic aneurysm, hypertension, morbid  obesity, hyperlipidemia, pulmonary embolus, chronic venous stasis,  nephrolithiasis, coronary artery disease, history of MI, recent negative  adenosine Myoview showing normal wall motion and no perfusion defect,  and multiple orthopedic procedures.   CURRENT MEDICATIONS:  1. Ditropan 5 mg daily.  2. Vitamin E 400 international units daily.  3. Vitamin B12 shots monthly.  4. Hydrochlorothiazide 25 mg daily.  5. Simvastatin 80 mg daily.  6. Fish oil 1 capsule b.i.d.  7. Toprol-XL 75 mg daily.  8. Sertraline 100 mg daily.  9. Losartan 100 mg daily.  10.She remains on Coumadin.  11.She uses tramadol p.r.n. and also takes calcium.   ALLERGIES:  She has an allergy to sulfa.   No changes in family history or social history.   REVIEW OF SYSTEMS:  Really unchanged from her last visit; chronic  occasional chest discomfort, frequent shortness of breath with exertion,  relatively deconditioned, ambulates with a walker, and chronic leg  swelling.   PHYSICAL EXAMINATION:  General:  The patient is an obese 77 year old  woman in no acute distress.  Vital Signs:  Her blood pressure is 108/70,  pulse 72, respirations are 18, and her oxygen saturation is 96% on room  air.  Neurological:   She is alert and oriented x3 with no focal  deficits.  Lungs:  Diminished, but equal breath sounds bilaterally.  There is no rales or wheezing.  Cardiac:  A regular rate and rhythm.  Normal S1 and S2.  No rubs or murmurs.  Abdomen:  Soft and nontender.  Extremities:  Venous stasis changes and varicosities in both lower  extremities.  Skin:  Atrophic.   LABORATORY DATA:  CT scan of the chest shows no change in her ascending  aortic aneurysm, still around 5 cm, it actually measures 4.8 x 4.8 which  be slightly smaller than the last visit, but I think that really just a  matter of the measurement being taken is slightly different location or  angle, essentially it is unchanged.  The proximal aortic arch is 3.6 cm,  which is unchanged.  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   IMPRESSION:  The patient is a 77 year old woman with a stable ascending  aortic aneurysm, which is in the 4.8 to 5-cm range.  This is unchanged  over the past 3 years, and looking back in her CT from 2004 when she had  a saddle pulmonary embolus, likely is unchanged going back that far.  In  any event, given the size, it  is still concerning and does need to be  followed.  I did once again stressed the importance of blood pressure  control, which on today's visit is very well controlled.  We also  discussed the importance of exercise, which I think is very limited in  her case.  She will follow up with Dr. Shirlee Latch at her scheduled time.  I  plan to see her back in about a year.  We will repeat her CT angio at  that time.   Salvatore Decent Dorris Fetch, M.D.  Electronically Signed   SCH/MEDQ  D:  02/14/2010  T:  02/14/2010  Job:  [id number]   cc:   [name], MD  Lupita Raider, M.D.

## 2011-04-03 NOTE — Consult Note (Signed)
Laura Griffith, Laura Griffith               ACCOUNT NO.:  [id number]   MEDICAL RECORD NO.:  [id number]          PATIENT TYPE:  INP   LOCATION:  1442                         FACILITY:  [facility]   PHYSICIAN:  Della Goo, M.D. DATE OF BIRTH:  1933-10-30   DATE OF CONSULTATION:  DATE OF DISCHARGE:                                 CONSULTATION   This is a medical consult for orthopedics, Dr. Sherlean Foot.   CHIEF COMPLAINT:  Right tib-fib fracture.   HISTORY OF PRESENT ILLNESS:  This is a 77 year old female who fell today  while turning around in her bathroom.  She denies having any dizziness,  syncope or chest pain, shortness of breath associated with the event.  She reports just falling down.  She suffered a fracture to the right  tibiofibular plateau.  She was evaluated in the emergency department and  admitted to the orthopedic service of Dr. Sherlean Foot.   PAST MEDICAL HISTORY:  1. History of paroxysmal atrial fibrillation.  2. Previous pulmonary embolism/DVT in July 2004.  3. Hypertension.  4. Coronary artery disease.  5. Hyperlipidemia.  6. Obesity.   PAST SURGICAL HISTORY:  Status post open reduction and internal fixation  of right ankle in July 2004.  Right total knee replacement and left knee  arthroscopy surgeries.  She also has had a cholecystectomy or a  nephrostomy - this history is not clear, and also has had a  tonsillectomy and adenoidectomy along with an IVC filter placement.   MEDICATIONS:  1. Atenolol 50 mg one p.o. daily.  2. Lipitor 20 mg one p.o. q.h.s.  3. Hydrochlorothiazide 25 mg one p.o. daily.  4. Ditropan 5 mg one p.o. q.h.s.  5. Wellbutrin 150 mg one p.o. b.i.d.   ALLERGIES:  SULFA.   SOCIAL HISTORY:  Nonsmoker, nondrinker.   FAMILY HISTORY:  Noncontributory.   PHYSICAL EXAMINATION FINDINGS:  This is a 77 year old morbidly-obese  female in discomfort but no acute distress.  VITAL SIGNS:  Temperature 97.1, blood pressure 139/81, heart rate 73,  respirations 20,  O2 saturations 96% on room air.  HEENT EXAMINATION:  Normocephalic, atraumatic.  Pupils equally round,  reactive to light.  Extraocular muscles are intact.  Oropharynx is  clear.  She is edentulous and has dentures present.  NECK:  Supple, full range of motion.  No thyromegaly, adenopathy,  jugulovenous distension.  CARDIOVASCULAR:  Regular rate and rhythm.  No murmurs, gallops, rubs.  Normal S1, S2.  LUNGS:  Clear to auscultation bilaterally.  ABDOMEN:  Positive bowel sounds, soft, nontender, nondistended.  No  hepatosplenomegaly.  EXTREMITIES:  Right lower extremity in a cast up to the prepatellar  area.  Left lower extremity without cyanosis, clubbing or edema.  Horizontal, old, well-healed surgical scar present at the lateral area  of the left patella.  NEUROLOGIC EXAMINATION:  Alert and oriented x3.  There are no focal  deficits.   LABORATORY STUDIES:  White blood cell count 6.8, hemoglobin 13.3,  hematocrit 39.3, platelets 138, MCV 94.4.  Sodium 141, potassium 4.6,  chloride 109, CO2 24, BUN 21, creatinine 1.33, glucose 115.  PT 13.3,  INR 1.0,  PTT 23.  X-ray studies of the right lower extremity reveal  displaced diaphyseal fractures of the mid right tibia and fibula, old  healed fractures of the distal fibula and tibia and first metatarsal,  degenerative changes especially at the tibiotalar joint.  EKG reveals a  normal sinus rhythm with a first-degree AV block.   IMPRESSION:  1. Acute fracture of the right tibia and fibula.  2. Hypertension.  3. Coronary artery disease.  4. Thrombocytopenia, mild, at a level of 138 on admission.   PLAN:  The patient is awaiting surgery per orthopedics, possibly on  Monday.  She will continue on her regular medications except her ACE  inhibitor has been placed on hold.  Her platelets will be rechecked and  a D-dimer will also be ordered.  Her platelets will be monitored daily.  If her D-dimer does return positive, a CT of the chest per the  PE  protocol will be ordered to evaluate for an acute pulmonary embolism or  Deep Venous Thrombosis.  Lovenox therapy will be discontinued secondary  to the patient's decreased platelets.  An alternative therapy will be  ordered per pharmacy.  The patient will be placed on GI prophylaxis.  Pain control therapy has been ordered by orthopedics.      Della Goo, M.D.  Electronically Signed     HJ/MEDQ  D:  06/11/2007  T:  06/12/2007  Job:  [id number]   cc:   Mila Homer. Sherlean Foot, M.D.  Fax: [phone]

## 2011-04-06 NOTE — Discharge Summary (Signed)
NAME:  Laura Griffith, Laura Griffith                         ACCOUNT NO.:  [id number]   MEDICAL RECORD NO.:  [id number]                   PATIENT TYPE:  INP   LOCATION:  5027                                 FACILITY:  [facility]   PHYSICIAN:  Claude Manges. Cleophas Dunker, M.D.            DATE OF BIRTH:  Dec 15, 1932   DATE OF ADMISSION:  05/27/2003  DATE OF DISCHARGE:  05/31/2003                                 DISCHARGE SUMMARY   ADMISSION DIAGNOSES:  1. Right distal fibular fracture.  2. Hypertension.  3. Obesity.  4. Depression.  5. Anxiety.  6. Coronary artery disease with history of myocardial infarction.  7. Hypercholesterolemia.   DISCHARGE DIAGNOSES:  1. Displaced right lateral malleolus fracture status post open reduction     internal fixation with deltoid ligament repair.  2. Hypertension.  3. Obesity.  4. Depression.  5. Anxiety.  6. Coronary artery disease with history of myocardial infarction.  7. Hypercholesterolemia.   SURGICAL PROCEDURE:  On May 27, 2003, Laura Griffith underwent an open reduction  internal fixation of her displaced right lateral malleolus fracture with  rupture of the deltoid ligament by Dr. Claude Manges. Whitfield, assisted by Oneida Alar, P.A.C.   COMPLICATIONS:  None.   CONSULTS:  Occupational therapy, physical therapy and rehab medicine consult  on May 28, 2003.   HISTORY OF PRESENT ILLNESS:  This 77 year old white female patient was out  in her garden the night before admission when he left leg gave way.  She was  unable to get up and then in an attempt to get up, she fell again.  She was  unable to stand and had to crawl to her house.  She denied any other  complaints.  She was brought to the emergency department and was found to  have a displaced right distal fibular fracture.  She is admitted for  surgical fixation of the fracture.   HOSPITAL COURSE:  Laura Griffith tolerated her surgical procedure without  complaint and was subsequently transferred to 5000.  On  postoperative day  one, she was afebrile, vital signs stable.  The pain was well controlled  with meds.  It was felt that she was going to have difficulty with  ambulation and taking care of herself at home so rehab consult was obtained.   She continued to do well over the next several days.  Progress with therapy  was slow.  A bed became available for her on the subacute unit on May 31, 2003 and she was transferred there at that time.   DISCHARGE INSTRUCTIONS:  1. Diet:  She can continue her current hospitalization diet with adjustments     to be made per the rehab physicians.  2. Medications:  She may continue her current hospitalization medications     with adjustments to be made per the rehab physicians.  3. Activity:  She is to be out of bed touchdown weightbearing on  the right     lower extremity with the use of a walker.  Please keep the splint clean,     dry and intact.  4. Wound Care:  The splint needs to be changed with a short leg cast applied     in approximately one week.  Staples can be removed with Steri-Strips and     Benzoin applied about postoperative day 10-14.  5. Follow-Up:  She needs to follow up with Dr. Cleophas Dunker in the office,     approximately one week after her discharge.  Please notify Dr. Cleophas Dunker     of temperature greater than 101.5, chills, pain unrelieved by pain meds,     or foul smelling drainage from the wound.   LABORATORY DATA:  Chest x-ray done on May 27, 2003 showed cardiomegaly with  probable mild venous hypertension.  No frank congestive heart failure or  active disease.   On May 28, 2003, hemoglobin was 11.7, hematocrit 34.3 and platelets 91.  On  July 8, glucose 112, albumen 3.4.  All other laboratory studies were within  normal limits.      Legrand Pitts Duffy, P.A.                      Claude Manges. Cleophas Dunker, M.D.    KED/MEDQ  D:  06/21/2003  T:  06/22/2003  Job:  [id number]

## 2011-04-06 NOTE — Op Note (Signed)
NAME:  Laura Griffith, Laura Griffith                         ACCOUNT NO.:  [id number]   MEDICAL RECORD NO.:  [id number]                   PATIENT TYPE:  INP   LOCATION:  2550                                 FACILITY:  [facility]   PHYSICIAN:  Claude Manges. Whitfield, M.D.            DATE OF BIRTH:  20-Mar-1933   DATE OF PROCEDURE:  05/27/2003  DATE OF DISCHARGE:                                 OPERATIVE REPORT   PREOPERATIVE DIAGNOSIS:  Displaced lateral malleolus fracture with rupture  of the deltoid ligament and lateral talar subluxation, right ankle.   POSTOPERATIVE DIAGNOSIS:  Displaced lateral malleolus fracture with rupture  of the deltoid ligament and lateral talar subluxation, right ankle.   PROCEDURE:  Open reduction internal fixation and primary repair of deltoid  ligament.   SURGEON:  Claude Manges. Cleophas Dunker, M.D.   ASSISTANT:  Joan Mayans, P.A.-C.   ANESTHESIA:  General orotracheal.   COMPLICATIONS:  None.   PROCEDURE:  With patient comfortable on the operating table and under  general orotracheal anesthesia, the right lower extremity was placed in a  tourniquet.  A pad was placed beneath the hip to place her in mild lateral  position with the left hip down.  The right lower extremity was then prepped  with Betadine, scrubbed, and then Duraprepped from the tips of the toes to  the knee.  Sterile draping was performed.  The extremity was elevated.  The  leg was Esmarched and exsanguinated with a proximal tourniquet at 400 mmHg.  The patient has very large legs.   A lateral incision was then made over the distal fibula to the tip of the  lateral malleolus and blunt and sharp dissection carried in the subcutaneous  tissue.  There was hematoma in the subcutaneous tissue.  I could easily  separate the soft tissue off the fibula to visualize the fracture.  The  fracture was a short oblique beginning proximally and posteriorly extending  anteriorly and distally.  It was laterally subluxed.  There was  some  invagination of the periosteum, so stripping just around the fracture was  carefully performed.  Under direct visualization, the fracture was reduced  and maintained with a bone clamp.  There was also a tear of the distal tibia-  fibular ligament with a small piece of bone attached, and this was repaired  with 2-0 Vicryl.  A 7-hole DePuy titanium distal fibula plate was then  applied.  There were three holes distal to the fracture and four proximal.  The plate was bent to conform to the shape of the fibula.  The four proximal  holes were drilled, measured, and filled with self-tapping titanium cortical  screws.  The first of the tapered holes was directly over the fracture site.  The two distal holes were then filled with cancellous screws.  Image  intensification revealed excellent position of the plate and screws without  penetration of the joint.  We  thought there was some diastasis, so we  attempted to insert a lag screw through the plate, the fibula, and the  tibia.  When we tightened it, we felt that this was too tight, and we had a  good ankle mortise so we left the screw out.   A second incision was then made in the short oblique manner over the medial  malleolus for repair of the deltoid ligament.  The soft tissue was elevated.  The ligament was disrupted, and part of that vaginated into the joint.  It  was then placed in what I thought was anatomic position and sutured with 2-0  Vicryl.  The wound was irrigated with saline solution.  The subcu was closed  with 2-0 Vicryl.  The skin was closed with skin clips, as was the lateral  incision.  Sterile bulky dressing was applied over Xeroform gauze followed  by posterior splints and an Ace bandage.  The tourniquet was deflated with  immediate capillary refill to the toes.   The patient tolerated the procedure without complications.                                               Claude Manges. Cleophas Dunker, M.D.    PWW/MEDQ  D:   05/27/2003  T:  05/28/2003  Job:  [id number]

## 2011-04-06 NOTE — Discharge Summary (Signed)
NAME:  Laura Griffith, Laura Griffith                         ACCOUNT NO.:  [id number]   MEDICAL RECORD NO.:  [id number]                   PATIENT TYPE:  INP   LOCATION:  5027                                 FACILITY:  [facility]   PHYSICIAN:  Ellwood Dense, M.D.                DATE OF BIRTH:  25-Sep-1933   DATE OF ADMISSION:  05/31/2003  DATE OF DISCHARGE:  06/11/2003                                 DISCHARGE SUMMARY   DISCHARGE DIAGNOSES:  1. Right displaced lateral malleolus ankle fracture status post open     reduction internal fixation May 27, 2003.  2. Pain management.  3. Subcutaneous Lovenox for deep vein thrombosis prophylaxis.  4. Hypertension.  5. Depression.  6. Hyperlipidemia.  7. Right total knee arthroplasty in the past.   HISTORY OF PRESENT ILLNESS:  This is a 77 year old female with history of  right total knee replacement admitted May 27, 2003, after a fall without  loss of consciousness sustaining a right displaced lateral malleolus ankle  fracture.  Underwent open reduction internal fixation May 27, 2003, per Dr.  Cleophas Dunker.  Advised touchdown weight bearing, postoperative pain management.  Hospital course was uneventful.  Total assist for bed mobility.  Latest  hemoglobin 11.7.  Admitted for comprehensive rehabilitation program.   PAST MEDICAL HISTORY:  See discharge diagnoses.   PAST SURGICAL HISTORY:  1. Right total knee replacement.  2. Right carpal tunnel release.  3. Cholecystectomy.   SOCIAL HISTORY:  Denies alcohol or tobacco.   ALLERGIES:  SULFA.   PRIMARY CARE PHYSICIAN:  [facility].   MEDICATIONS:  Prior to admission:  Wellbutrin, Cozaar, Lasix, Lipitor,  hydrochlorothiazide, Tenormin and Zoloft.  Dose were not made available.   SOCIAL HISTORY:  Lives alone in [place].  Independent with a walker prior  to admission.  One level home. No steps to entry.  Two daughters in the  area.   HOSPITAL COURSE:  The patient did well with  rehabilitation services with  therapies initiated on a daily basis.  The following issues were followed  during patient's rehab course.  Pertaining to Ms. Aldana' right lateral  malleolus ankle fracture, open reduction internal fixation May 27, 2003, as  dictated above per Dr. Cleophas Dunker.  She was touch-toe weightbearing with  neurovascular sensation remaining intact.  She was placed on subcutaneous  Lovenox, but upon admission to rehabilitation services for deep vein  thrombosis prophylaxis, her calves remained cool without swelling, erythema  and nontender.  She was using Tylox on a limited basis for pain.  Blood  pressure was controlled on home regimens of Lasix, Tenormin,  hydrochlorothiazide.  There was no headache.  No dizziness.  She had a  history of depression.  Maintained on Wellbutrin and Zoloft.  She was  cooperative with staff.  Resting well at night.  She would continue on her  Lipitor for hyperlipidemia.  She had no bowel or bladder  disturbances.  Overall, for her functional mobility, she was supervision bed mobility and  moderate assist transfers, propelling her wheelchair close supervision 100  feet, moderate assist toileting, total assist for her hygiene, total assist  lower extremity activities of daily living.   PLAN:  Seek skilled nursing facility with bed becoming available at  [facility] on June 11, 2003.  Discharge taking place at that time.   LABORATORY DATA:  Sodium 139, potassium 3.9, BUN 15, creatinine 0.9,  hemoglobin 12.9, hematocrit 37.6, platelets 132,000.   DISCHARGE MEDICATIONS:  1. Lasix 20 mg p.o. daily.  2. Zocor 20 mg p.o. daily.  3. Hydrochlorothiazide 25 mg p.o. daily.  4. Tenormin 100 mg p.o. q.h.s.  5. Zoloft 100 mg p.o. q.h.s.  6. Wellbutrin 150 mg p.o. daily.  7. Multivitamin one p.o. daily.  8. Tylox 1-2 tablets q.4h. p.r.n. pain.  9. Her subcutaneous Lovenox had been discontinued at the time of discharge.   ACTIVITY:  Touchdown  weightbearing with walker wheelchair.   DIET:  Regular.   SPECIAL INSTRUCTIONS:  Continue therapies to promote overall mobility and  well being.  She should follow up with Dr. Cleophas Dunker [phone]), Orthopedic  Services.  [facility] will continue to be her  medical providers.     Mariam Dollar, P.A.                     Ellwood Dense, M.D.    DA/MEDQ  D:  06/10/2003  T:  06/10/2003  Job:  [id number]   cc:   Ellwood Dense, M.D.  [address] [place]  [place] [address]  Fax: [phone]   Claude Manges. Cleophas Dunker, M.D.  [address] [place]  [place] [address]  Fax: [phone]   [facility]   [facility]    cc:   Ellwood Dense, M.D.  [address] [place]  [place] [address]  Fax: [phone]   Claude Manges. Cleophas Dunker, M.D.  [address] [place]  [place] [address]  Fax: [phone]   [facility]   [facility]

## 2011-04-06 NOTE — Discharge Summary (Signed)
NAMEMEAGHEN, VECCHIARELLI               ACCOUNT NO.:  [id number]   MEDICAL RECORD NO.:  [id number]            PATIENT TYPE:   LOCATION:                                 FACILITY:   PHYSICIAN:  Doralee Albino. Carola Frost, M.D.      DATE OF BIRTH:   DATE OF ADMISSION:  06/10/2007  DATE OF DISCHARGE:  06/19/2007                               DISCHARGE SUMMARY   DISCHARGE DIAGNOSIS:  Right tibia-fibular fracture.   PROCEDURE PERFORMED:  Open reduction and internal fixation of right  tibia on June 17, 2007.   ADDITIONAL DIAGNOSES:  1. Hypertension.  2. Coronary artery disease status post myocardial infarction.  3. Obesity.  4. Depression.  5. Hyperlipidemia.  6. Atrial fibrillation.  7. Previous deep venous thrombosis and pulmonary embolism status post      total knee arthroplasty in 2004.  8. Inferior vena cava filter placement.   BRIEF SUMMARY OF HOSPITAL COURSE:  Ms. Rindfleisch was initially seen and  evaluated by Dr. Sherlean Foot and then I was subsequently consulted regarding  possible percutaneous internal fixation of her tibia.  I discussed the  risks and benefits of surgery, and she wished to proceed with definitive  internal fixation which was performed without complication on June 17, 2007.  She was transitioned to heparin preoperatively and then  transitioned back to Coumadin postoperatively with heparin as a bridge  once more.  She tolerated the procedure well, did not have any  postoperative complications, was nonweightbearing on the right lower  extremity.  Dressing was changed on postoperative day #2.  Wound found  to be clean, dry, and intact with no significant evidence of infection.  The patient was discharged to inpatient rehab facility.  Prior to doing  so, she did undergo a renal ultrasound for calculus.   DISCHARGE MEDICATIONS:  She is to resume medications which consist of:  1. Coumadin.  2. Aspirin 81 mg.  3. Atenolol 50 mg daily.  4. Boniva 150 mg once a month.  5. Cozaar 100 mg  daily.  6. Ditropan XL 5 mg daily.  7. Glucosamine sulfate 500 mg daily.  8. Hydrochlorothiazide 25 mg daily.  9. Lipitor 20 mg daily.  10.Percocet 5/325 mg p.o. q.6 h. p.r.n.  11.Vitamin E 400 units.  12.Wellbutrin SR 150 mg daily.  13.Again Coumadin protocol.   DISCHARGE INSTRUCTIONS:  We will plan to see Ms. Luft back in the  office in about 2 weeks for reevaluation of her wound and possible  removal of sutures.  She will contact me in the interim with any  problems, concerns, or questions.  She will continue with unrestricted  range of motion in the knee and ankle.      Doralee Albino. Carola Frost, M.D.  Electronically Signed     MHH/MEDQ  D:  02/04/2008  T:  02/04/2008  Job:  [id number]

## 2011-04-06 NOTE — Discharge Summary (Signed)
NAME:  Laura Griffith, Laura Griffith                         ACCOUNT NO.:  [id number]   MEDICAL RECORD NO.:  [id number]                   PATIENT TYPE:  INP   LOCATION:  3735                                 FACILITY:  [facility]   PHYSICIAN:  Pearlean Brownie, M.D.            DATE OF BIRTH:  08/14/1933   DATE OF ADMISSION:  07/05/2003  DATE OF DISCHARGE:  07/12/2003                                 DISCHARGE SUMMARY   DISCHARGE DIAGNOSES:  1. Pulmonary embolus secondary to bilateral deep venous thrombosis.  2. Hypoxia.  3. Right malleolar fracture.  4. Hypertension.   PROCEDURES:  1. Spiral computed tomography of the chest with large saddle embolus     extending into upper and lower pulmonary arteries bilaterally.  2. Bilateral lower extremity Dopplers with left lower extremity with     thrombus from popliteal vein to posterior tibial vein, superficial veins     were patent.  Right lower extremity with thrombus from mid portion of     femoral vein to the popliteal vein.  Superficial veins were patent.  3. Inferior vena cava Griffith placement.  4. Transthoracic echocardiogram with pulmonary artery pressures of 52 mmHg,     mild aortic valve regurgitation, mild mitral annular calcification, mild     right ventricular dilatation with reduced right ventricular systolic     function.   CONSULTATIONS:  1. Pulmonary critical care medicine.  2. Interventional radiology.   HISTORY OF PRESENT ILLNESS:  Laura Griffith is a 77 year old, white female who  had a right malleolar fracture operated on May 27, 2003, and had been  receiving rehabilitation ever since who presented with chest pain and  shortness of breath while receiving rehabilitation at a skilled nursing  facility.  The patient at baseline is not oxygen-requiring, however, upon  presentation she was saturating 77% on 5 L nasal cannula.  She was placed on  BiPAP, but did not require intubation.  With BiPAP, the patient's O2  saturations raised  greater than 90%.  She was then transferred to the  critical care unit for monitoring as well as initiation of anticoagulation.   PHYSICAL EXAMINATION:  VITAL SIGNS:  Stable with the exception of the above-  mentioned hypoxia.  GENERAL:  The patient was alert and oriented x3.  HEENT:  Pupils equal round and reactive to light.  Extraocular movements  intact.  Oropharynx with moist mucous membranes.  NECK:  Normal.  LUNGS:  Decreased breath sounds secondary to body habitus.  Increased  respiratory effort with moderate distress.  However, lung fields were clear  to auscultation bilaterally.  CARDIAC:  Irregularly, irregular with distant heart sounds.  Unable to  palpate PMI.  MUSCULOSKELETAL:  Cast on right lower extremity.  NEUROLOGIC:  Strength 5/5 x4.  Cranial nerves 2-12 grossly intact.  ABDOMEN:  Obese, nontender, nondistended, normal bowel sounds.  SKIN:  Ecchymosis of the right shoulder.   LABORATORY DATA AND X-RAY FINDINGS:  White blood cell count 10, hemoglobin  15.3, platelets 118.  Sodium 139, potassium 2.4, chloride 106, bicarb 16,  BUN 29, creatinine 1.3, glucose 261.  A BNP was 578.  INR 1.1.   HOSPITAL COURSE:  Problem 1.  PULMONARY EMBOLUS SECONDARY TO BILATERAL DEEP  VENOUS THROMBOSIS:  As stated above, the patient was placed on BiPAP in the  emergency department which ultimately resolved the hypoxia.  The patient's  respiratory status continued to improve throughout the entire hospital  course.  The patient was started on a heparin drip with heparin level  monitoring.  Initially, the patient's heparin levels were elevated; however,  with continuation of the heparin drip, the patient could not maintain a  therapeutic heparin level.  Therefore, anticoagulation was eventually  changed to Lovenox.  In addition, on July 06, 2003, an IVC Griffith was  placed by interventional radiology.  The procedure occurred without  complication.  The patient had no signs or symptoms of  excess bleeding  following the procedure.  The patient was started on Coumadin at 10 mg.  On  July 10, 2003, the patient's INR was 2.2 and the following day 3.1.  At  the time of discharge, the patient's INR was 2.7.  This was after receiving  no Coumadin the day before secondary to the elevated INR.  The patient's  blood pressures remained stable throughout the entire hospital course with  no signs or symptoms of internal bleeding or hemorrhage.   Problem 2.  HYPOXIA:  As stated above, the patient was placed on BiPAP in  the emergency department.  At the time of transfer to critical care unit,  the patient was requiring 5 L of oxygen which maintained her saturations  greater than 90%.  The patient was eventually weaned to 2 L nasal cannula at  the time of discharge at which time saturations were 98%.  The patient  should be continued on her oxygen until saturations can be maintained  greater than 95% without O2 supplementation.   Problem 3.  RIGHT MALLEOLAR FRACTURE:  The patient was placed in a walking  boot while in the hospital.  However, the patient remains nonweightbearing  on her right leg.  The patient received physical therapy while in the  hospital and will be discharged with continuation of therapy for the  fracture.  The patient's pain was well-controlled with Percocet throughout  the entire hospital course.   Problem 4.  HYPERTENSION:  The patient had initial elevations of blood  pressure.  This was likely secondary to the stress due to the embolus with  resolution of medical factors.  The patient's blood pressure stabilized on  her home regimen.   DISPOSITION:  The patient is to be discharged to a skilled nursing facility  for extended rehabilitation.   DISCHARGE MEDICATIONS:  1. Wellbutrin 150 mg p.o. q.d.  2. Zoloft 100 mg p.o. q.d.  3. Zocor 20 mg p.o. q.d.  4. Atenolol 100 mg p.o. q.d.  5. Cozaar 100 mg p.o. q.d. 6. Coumadin 4 mg p.o. x2 days with appropriate  dosing to follow after INR     levels.  7. Percocet 5/325 mg one tablet p.o. q.6h. p.r.n. pain or headache.  8. Calcium carbonate 500 mg p.o. t.i.d.  9. Ditropan XL 5 mg q.h.s.  10.      Oxygen 2 L until oxygen saturations are consistently greater than     95%.  The patient may then be weaned off of oxygen.  SPECIAL INSTRUCTIONS:  The patient's oxygen saturations should be maintained  as noted above.  Coumadin dosing with the patient having INR checked on  Wednesday, August 25, at which time Coumadin should be dosed appropriately.  If the INR is within therapeutic range of 2.0-3.0, the next INR check should  be on Friday, August 27.  If the level is again within 2.0-3.0, the  patient's next INR level should be checked the following Friday.  If the INR  remains therapeutic between 2.0-3.0, the patient's INR may be checked every  four weeks from that point on.   FOLLOW UP:  Prior to discharge from the skilled nursing facility, the  patient should have a follow-up appointment with Dr. Norlene Campbell of  orthopedic surgery.  His number is [phone].  In addition, the patient  should have a follow-up appointment scheduled with Dr. Douglass Rivers at  [facility], phone number [phone].  If Dr. Michae Kava is  unavailable, the patient should have an appointment scheduled with the  physician covering for Dr. Lucretia Roers clinic.      Franchot Mimes, MD                         Pearlean Brownie, M.D.    TV/MEDQ  D:  07/12/2003  T:  07/12/2003  Job:  [id number]   cc:   Claude Manges. Cleophas Dunker, M.D.  [address] [place]  [place] [address]  Fax: [phone]   Douglass Rivers, M.D.  [facility].  [facility] Resident  [place], [place] [address]  Fax: [phone]

## 2011-04-18 ENCOUNTER — Ambulatory Visit (INDEPENDENT_AMBULATORY_CARE_PROVIDER_SITE_OTHER): Payer: PRIVATE HEALTH INSURANCE | Admitting: *Deleted

## 2011-04-18 DIAGNOSIS — I4891 Unspecified atrial fibrillation: Secondary | ICD-10-CM

## 2011-04-18 LAB — POCT INR: INR: 2.3

## 2011-04-23 ENCOUNTER — Other Ambulatory Visit: Payer: Self-pay | Admitting: Family Medicine

## 2011-04-23 NOTE — Telephone Encounter (Signed)
Refill request

## 2011-04-30 ENCOUNTER — Telehealth: Payer: Self-pay | Admitting: Family Medicine

## 2011-04-30 NOTE — Telephone Encounter (Signed)
Needs verbal to continue B-12 injections

## 2011-05-01 NOTE — Telephone Encounter (Signed)
It is fine to continue B-12 injections.  Would recommend her coming in and having a B-12 level drawn.

## 2011-05-14 ENCOUNTER — Ambulatory Visit (INDEPENDENT_AMBULATORY_CARE_PROVIDER_SITE_OTHER): Payer: PRIVATE HEALTH INSURANCE | Admitting: *Deleted

## 2011-05-14 DIAGNOSIS — I4891 Unspecified atrial fibrillation: Secondary | ICD-10-CM

## 2011-05-30 ENCOUNTER — Telehealth: Payer: Self-pay | Admitting: Family Medicine

## 2011-05-30 NOTE — Telephone Encounter (Signed)
Calling to let MD know pts pharmacy [facility] is out of B12 and its on back order, cannot transfer rx to another pharmacy and pt was due on Monday, wants to know if MD wants to try to send to another pharmacy?

## 2011-06-05 NOTE — Telephone Encounter (Signed)
Will you ask patient to call to different pharmacies and to let me know which one has it so I can send in prescription? Thanks [facility].

## 2011-06-08 ENCOUNTER — Telehealth: Payer: Self-pay | Admitting: Family Medicine

## 2011-06-08 NOTE — Telephone Encounter (Signed)
B12 can be sent to [facility] [phone], they are holding a bottle, please sent prescription, can a 2 month supply be called in.

## 2011-06-11 ENCOUNTER — Ambulatory Visit (INDEPENDENT_AMBULATORY_CARE_PROVIDER_SITE_OTHER): Payer: PRIVATE HEALTH INSURANCE | Admitting: *Deleted

## 2011-06-11 DIAGNOSIS — I4891 Unspecified atrial fibrillation: Secondary | ICD-10-CM

## 2011-06-11 NOTE — Telephone Encounter (Signed)
Called again about b-12 - she doesn't want [facility] [phone]) to run out.  Needs script in order to get this bottle.

## 2011-06-11 NOTE — Telephone Encounter (Signed)
Advised patient that message will be sent to MD.

## 2011-06-12 ENCOUNTER — Other Ambulatory Visit: Payer: Self-pay | Admitting: Family Medicine

## 2011-06-12 DIAGNOSIS — E538 Deficiency of other specified B group vitamins: Secondary | ICD-10-CM

## 2011-06-12 MED ORDER — CYANOCOBALAMIN 1000 MCG/ML IJ SOLN: 1000.0000 ug | INTRAMUSCULAR | Status: DC

## 2011-06-12 NOTE — Telephone Encounter (Signed)
Sent in to [facility]. They will call her and inform her when it's ready.

## 2011-06-22 ENCOUNTER — Other Ambulatory Visit: Payer: Self-pay | Admitting: Cardiology

## 2011-06-27 ENCOUNTER — Telehealth: Payer: Self-pay | Admitting: Family Medicine

## 2011-06-27 NOTE — Telephone Encounter (Signed)
In needing verbal orders to admin her B-12 shots

## 2011-06-27 NOTE — Telephone Encounter (Signed)
To MD for approval Laura Griffith, Laura Griffith

## 2011-07-09 ENCOUNTER — Ambulatory Visit (INDEPENDENT_AMBULATORY_CARE_PROVIDER_SITE_OTHER): Payer: PRIVATE HEALTH INSURANCE | Admitting: *Deleted

## 2011-07-09 DIAGNOSIS — I4891 Unspecified atrial fibrillation: Secondary | ICD-10-CM

## 2011-07-09 LAB — POCT INR: INR: 2.7

## 2011-07-10 ENCOUNTER — Other Ambulatory Visit: Payer: Self-pay | Admitting: Family Medicine

## 2011-07-10 NOTE — Telephone Encounter (Signed)
Yes, please call it in for me Shanda Bumps. Thank you.

## 2011-07-11 NOTE — Telephone Encounter (Signed)
Laura Griffith informed. Laura Griffith, Laura Griffith

## 2011-07-24 ENCOUNTER — Other Ambulatory Visit: Payer: Self-pay | Admitting: Family Medicine

## 2011-07-24 NOTE — Telephone Encounter (Signed)
Refill request

## 2011-08-01 ENCOUNTER — Ambulatory Visit (INDEPENDENT_AMBULATORY_CARE_PROVIDER_SITE_OTHER): Payer: Medicaid Other | Admitting: Family Medicine

## 2011-08-01 ENCOUNTER — Encounter: Payer: Self-pay | Admitting: Family Medicine

## 2011-08-01 VITALS — BP 135/77 | HR 87 | Wt 232.0 lb

## 2011-08-01 DIAGNOSIS — E785 Hyperlipidemia, unspecified: Secondary | ICD-10-CM

## 2011-08-01 DIAGNOSIS — M1990 Unspecified osteoarthritis, unspecified site: Secondary | ICD-10-CM

## 2011-08-01 DIAGNOSIS — Z0000 Encounter for general adult medical examination without abnormal findings: Secondary | ICD-10-CM

## 2011-08-01 DIAGNOSIS — E538 Deficiency of other specified B group vitamins: Secondary | ICD-10-CM

## 2011-08-01 DIAGNOSIS — I10 Essential (primary) hypertension: Secondary | ICD-10-CM

## 2011-08-01 NOTE — Assessment & Plan Note (Addendum)
Will check B-12 level and CBC today. Will probably continue B-12 injections for now especially since patient desiring this.

## 2011-08-01 NOTE — Assessment & Plan Note (Signed)
BP at goal. Continue HCTZ, losartan, metoprolol. Dr. Shirlee Latch (cardiologist) also managing. Checking BMET today.

## 2011-08-01 NOTE — Progress Notes (Signed)
  Subjective:    Patient ID: Laura Griffith, female    DOB: 08-Sep-1933, 77 y.o.   MRN: [id number]  HPI Here to meet me and to get approved for B-12 injections.  Doing well. No acute complaints.  Review of Systems Denies chest pain, difficulty breathing, nausea/vomiting/diarrhea/constipation, dysuria, difficulty urinating, heart palpitations.     Objective:   Physical Exam Gen: NAD, sitting in wheelchair, accompanied by daughter, obese CV: irregularly irregular, 1+ radial pulses Pulm: CTAB, no w/r/r, good effort and aeration Abd: NABS, soft, NT, obese Ext: warm, 1+ pedal pulses, excess skin on calves, no edema Psych: pleasant, engaged, alert, appropriate Neuro: no focal deficits sitting down (did not ambulate her)    Assessment & Plan:

## 2011-08-01 NOTE — Assessment & Plan Note (Signed)
Compliant with Crestor. Checking direct LDL today.

## 2011-08-01 NOTE — Assessment & Plan Note (Signed)
Chronic hand pain. High-dose Tylenol and once daily Tramadol is helping. Not requesting more analgesia for this.

## 2011-08-01 NOTE — Assessment & Plan Note (Signed)
Flu shot today.  Discussed advanced directives. Given form and asked to discuss with family, fill-out, and bring in 6 month follow-up. Patient is expresses wanting to be DNR/DNI currently.

## 2011-08-01 NOTE — Patient Instructions (Signed)
Very nice to meet you Laura Griffith.  If your lab results are normal, I will send you a letter with the results. If abnormal, someone at the clinic will get in touch with you.   I hope you and your family go through the Advanced Directives.  Try to bring it next time with you.  Follow-up in 6 months.

## 2011-08-02 ENCOUNTER — Encounter: Payer: Self-pay | Admitting: Family Medicine

## 2011-08-02 LAB — BASIC METABOLIC PANEL
CO2: 25 mEq/L (ref 19–32)
Calcium: 9.8 mg/dL (ref 8.4–10.5)
Chloride: 104 mEq/L (ref 96–112)
Creat: 1.16 mg/dL — ABNORMAL HIGH (ref 0.50–1.10)
Glucose, Bld: 98 mg/dL (ref 70–99)
Sodium: 140 mEq/L (ref 135–145)

## 2011-08-02 LAB — CBC
HCT: 47.4 % — ABNORMAL HIGH (ref 36.0–46.0)
Hemoglobin: 15.4 g/dL — ABNORMAL HIGH (ref 12.0–15.0)
MCH: 32.8 pg (ref 26.0–34.0)
MCHC: 32.5 g/dL (ref 30.0–36.0)
RDW: 13.8 % (ref 11.5–15.5)

## 2011-08-06 ENCOUNTER — Encounter: Payer: Self-pay | Admitting: Cardiology

## 2011-08-06 ENCOUNTER — Ambulatory Visit (INDEPENDENT_AMBULATORY_CARE_PROVIDER_SITE_OTHER): Payer: PRIVATE HEALTH INSURANCE | Admitting: *Deleted

## 2011-08-06 ENCOUNTER — Ambulatory Visit (INDEPENDENT_AMBULATORY_CARE_PROVIDER_SITE_OTHER): Payer: PRIVATE HEALTH INSURANCE | Admitting: Cardiology

## 2011-08-06 DIAGNOSIS — I712 Thoracic aortic aneurysm, without rupture, unspecified: Secondary | ICD-10-CM

## 2011-08-06 DIAGNOSIS — I5032 Chronic diastolic (congestive) heart failure: Secondary | ICD-10-CM

## 2011-08-06 DIAGNOSIS — I10 Essential (primary) hypertension: Secondary | ICD-10-CM

## 2011-08-06 DIAGNOSIS — I4891 Unspecified atrial fibrillation: Secondary | ICD-10-CM

## 2011-08-06 DIAGNOSIS — I509 Heart failure, unspecified: Secondary | ICD-10-CM

## 2011-08-06 NOTE — Assessment & Plan Note (Signed)
Chronic diastolic CHF.  Volume status looks ok.  She is short of breath with moderate exertion, but this may have more to do with obesity and deconditioning.

## 2011-08-06 NOTE — Progress Notes (Signed)
PCP: Dr. Bluford Kaufmann  77 yo with history of ascending aortic aneurysm, obesity, atrial fibrillation, and significant osteoarthritis presents for cardiology followup.  She has an ascending aortic aneurysm measuring 4.9 cm (stable size) that is being followed by Dr. Dorris Fetch.  Last CT was in 3/12.  She is living at home but has help.  She is sedentary and uses a walker to get around for stability.  She has pain in her knees, lower legs, and ankles.  She also has bilateral shoulder pain from rotator cuff problems and has difficulty abducting her arms.  She is morbidly obese.  She is not short of breath walking slowly with her walker but will become short of breath if she "rushes."  She does housework and cooks and gets mildly short of breath with heavier housework.  Denies orthopnea.  She has been steady on her feet with no falls and is tolerating the coumadin ok.  She is in atrial fibrillation today.  She gets "twinges" of chest pain that is nonexertional and lasts only for seconds.  BP is under good control.   ECG: atrial fibrillation at 87 with IVCD  Labs (12/11): K 4.4, creatinine 1.2, LDL 66, HDL 44 Labs (9/12): K 4.1, creatinine 1.16, LDL 69  Allergies:  1)  Sulfamethoxazole (Sulfamethoxazole)  Past Medical History: 1. Obesity 2. Depression 3. HTN 4. Osteoarthritis with pain in multiple joints 5. Hyperlipidemia 6. CKD 7. h/o PE: several years ago per patient. 8. Ascending aortic aneurysm: Followed by Dr. Dorris Fetch.  CT chest (3/11) with stable 4.8 cm ascending aortic aneurysm.  CTA (3/12) with 4.9 cm ascending aortic aneurysm.  9. Echo (7/10): Technically difficult study.  Septal and inferolateral hypokinesis.  EF "moderately decreased." Mildly dilated ascending aorta.  Mild aortic insufficiency.  10.  Adenosine myoview (8/10): Done because of technically difficult echo with ? LV systolic dysfunction and wall motion abnormalities.  This study showed normal EF 63% and normal wall motion.  No  perfusion defect so no evidence of ischemia/infarction. 11.  Atrial fibrillation: Noted initially at adenosine myoview 8/10.  She seems to be in chronic atrial fibrillation at this point.   Family History: Longevity.  No CVA, no CAD, no neurologic dz.  Breast Cancer in mother (6).  4 sisters alive and well. No h/o fracture  Social History: Lives alone.  Children live within 1 mile of her & very supportive and involved in her care.  No EtOH, no tobacco.  Widowed.  Walks with walker.    Current Outpatient Prescriptions  Medication Sig Dispense Refill  . acetaminophen (TYLENOL) 650 MG CR tablet Take 650 mg by mouth as needed.       . Calcium Carbonate-Vitamin D (CALCIUM-VITAMIN D) 500-200 MG-UNIT per tablet Take 1 tablet by mouth 2 (two) times daily with meals.        . cyanocobalamin (COBAL-1000) 1000 MCG/ML injection Inject 1 mL (1,000 mcg total) into the skin every 30 (thirty) days.  10 mL  1  . fish oil-omega-3 fatty acids 1000 MG capsule Take 2 g by mouth daily.        . hydrochlorothiazide 25 MG tablet TAKE ONE (1) TABLET EACH DAY  30 tablet  6  . lidocaine (LIDODERM) 5 % Place 1 patch onto the skin daily. Remove & Discard patch within 12 hours or as directed by MD       . losartan (COZAAR) 100 MG tablet TAKE ONE (1) TABLET EACH DAY  30 tablet  6  . metoprolol (TOPROL-XL)  50 MG 24 hr tablet TAKE ONE AND ONE-HALF TABLETS DAILY  45 tablet  11  . oxybutynin (DITROPAN) 5 MG tablet Take 5 mg by mouth daily.        . rosuvastatin (CRESTOR) 10 MG tablet Take 1 tablet (10 mg total) by mouth daily.  30 tablet  5  . sertraline (ZOLOFT) 100 MG tablet Take 100 mg by mouth daily.        . traMADol (ULTRAM-ER) 200 MG 24 hr tablet TAKE ONE (1) TABLET EACH DAY FOR        ARTHRITIS  30 tablet  3  . warfarin (COUMADIN) 1 MG tablet Take as directed by Anticoagulation clinic Pt takes up 2 tablets daily  60 tablet  3  . DISCONTD: oxybutynin (DITROPAN-XL) 5 MG 24 hr tablet TAKE ONE (1) TABLET EACH DAY  30  tablet  6    BP 122/80  Pulse 78  Resp 12 General:  Morbidly obese.  No acute distress Neck:  Neck supple, no JVD. No masses, thyromegaly or abnormal cervical nodes. Lungs:  Clear bilaterally to auscultation and percussion. Heart:  Non-displaced PMI, chest non-tender; irregular rate and rhythm, S1, S2 without murmurs, rubs or gallops. Carotid upstroke normal, no bruit.  Pedals normal pulses. No edema.  Abdomen:  Bowel sounds positive; abdomen soft and non-tender without masses, organomegaly, or hernias noted. No hepatosplenomegaly. Extremities:  No clubbing or cyanosis. Neurologic:  Alert and oriented x 3. Psych:  Normal affect.

## 2011-08-06 NOTE — Assessment & Plan Note (Signed)
Chronic atrial fibrillation on coumadin.  No falls.  Stable rate control on Toprol XL.

## 2011-08-06 NOTE — Assessment & Plan Note (Signed)
Stable 4.9 cm ascending aortic aneurysm.  She would be a poor surgical candidate. Followup with MRA chest in 3/13.

## 2011-08-06 NOTE — Assessment & Plan Note (Signed)
BP is under reasonable control.  Continue current medications.  

## 2011-08-06 NOTE — Patient Instructions (Signed)
Your physician recommends that you schedule a follow-up appointment in: 6 months with Dr. Shirlee Latch after your MRA of the chest  DR. MCLEAN RECOMMENDS THAT YOUR HAVE AN MRA OF THE CHEST IN 6 MONTHS BEFORE YOUR OFFICE VISIT WITH HIM.  THIS IS TO EVALUATE YOUR ANEUYRISM

## 2011-08-20 ENCOUNTER — Other Ambulatory Visit: Payer: Self-pay | Admitting: Cardiology

## 2011-08-28 ENCOUNTER — Telehealth: Payer: Self-pay | Admitting: Family Medicine

## 2011-08-28 NOTE — Telephone Encounter (Signed)
Spoke with [employer]. Patient may still have monthly B-12 injections.

## 2011-08-28 NOTE — Telephone Encounter (Signed)
Needs permission to continue to see her for monthly B12 injections.  She just needs a verbal yes.

## 2011-08-31 LAB — PROTIME-INR
INR: 2.2 — ABNORMAL HIGH
Prothrombin Time: 30.7 — ABNORMAL HIGH

## 2011-09-03 ENCOUNTER — Ambulatory Visit (INDEPENDENT_AMBULATORY_CARE_PROVIDER_SITE_OTHER): Payer: PRIVATE HEALTH INSURANCE | Admitting: *Deleted

## 2011-09-03 DIAGNOSIS — I4891 Unspecified atrial fibrillation: Secondary | ICD-10-CM

## 2011-09-03 LAB — COMPREHENSIVE METABOLIC PANEL
ALT: 12
ALT: 12
AST: 18
Albumin: 2.4 — ABNORMAL LOW
Alkaline Phosphatase: 72
Alkaline Phosphatase: 63
BUN: 21
CO2: 25
CO2: 24
Chloride: 103
Chloride: 109
GFR calc Af Amer: 50 — ABNORMAL LOW
GFR calc non Af Amer: 39 — ABNORMAL LOW
Glucose, Bld: 115 — ABNORMAL HIGH
Potassium: 3.5
Potassium: 4.6
Sodium: 134 — ABNORMAL LOW
Total Bilirubin: 0.8
Total Bilirubin: 0.7

## 2011-09-03 LAB — BASIC METABOLIC PANEL
BUN: 18
BUN: 24 — ABNORMAL HIGH
CO2: 27
CO2: 27
Calcium: 8.6
Calcium: 8.7
Chloride: 104
Chloride: 107
Creatinine, Ser: 1.29 — ABNORMAL HIGH
Creatinine, Ser: 1.31 — ABNORMAL HIGH
GFR calc non Af Amer: 29 — ABNORMAL LOW
Glucose, Bld: 90
Potassium: 3.7
Potassium: 4.6
Sodium: 136

## 2011-09-03 LAB — COMPREHENSIVE METABOLIC PANEL WITH GFR
ALT: 13
ALT: 13
AST: 23
AST: 24
Albumin: 2.2 — ABNORMAL LOW
Albumin: 2.4 — ABNORMAL LOW
Alkaline Phosphatase: 60
Alkaline Phosphatase: 67
BUN: 17
BUN: 28 — ABNORMAL HIGH
CO2: 23
CO2: 26
Calcium: 8.5
Calcium: 8.5
Chloride: 106
Chloride: 110
Creatinine, Ser: 1.14
Creatinine, Ser: 1.52 — ABNORMAL HIGH
GFR calc non Af Amer: 34 — ABNORMAL LOW
GFR calc non Af Amer: 47 — ABNORMAL LOW
Glucose, Bld: 108 — ABNORMAL HIGH
Glucose, Bld: 99
Potassium: 4
Potassium: 4.5
Sodium: 137
Sodium: 138
Total Bilirubin: 0.6
Total Bilirubin: 0.8
Total Protein: 5.9 — ABNORMAL LOW
Total Protein: 6.4

## 2011-09-03 LAB — HEPARIN INDUCED PLATELET AGGREGATION (CONVERTED LAB)
Heparin 0.1 Donor: 3
Heparin 0.1 Patient: 6
Heparin 1 Donor: 2
Heparin 1 Patient: 3
Heparin 100 Donor: 8
Heparin 100 Patient: 1
Interpretation-HITDU: NEGATIVE
Saline Donor:: 2
Saline Patient:: 6

## 2011-09-03 LAB — CBC
HCT: 30.1 — ABNORMAL LOW
HCT: 31.6 — ABNORMAL LOW
HCT: 31.8 — ABNORMAL LOW
HCT: 31.8 — ABNORMAL LOW
HCT: 33 — ABNORMAL LOW
HCT: 39.3
Hemoglobin: 11 — ABNORMAL LOW
Hemoglobin: 10.6 — ABNORMAL LOW
Hemoglobin: 10.6 — ABNORMAL LOW
Hemoglobin: 11.1 — ABNORMAL LOW
Hemoglobin: 11.3 — ABNORMAL LOW
Hemoglobin: 12.3
Hemoglobin: 13.3
MCHC: 33.3
MCHC: 33
MCHC: 33.2
MCHC: 33.4
MCHC: 33.8
MCHC: 34.2
MCV: 97.7
MCV: 94.2
MCV: 94.9
MCV: 95.4
MCV: 96.7
MCV: 96.7
MCV: 96.9
Platelets: 191
Platelets: 119 — ABNORMAL LOW
Platelets: 139 — ABNORMAL LOW
Platelets: 142 — ABNORMAL LOW
Platelets: 154
Platelets: 158
Platelets: 171
RBC: 3.27 — ABNORMAL LOW
RBC: 3.3 — ABNORMAL LOW
RBC: 3.34 — ABNORMAL LOW
RBC: 3.41 — ABNORMAL LOW
RBC: 3.45 — ABNORMAL LOW
RBC: 3.8 — ABNORMAL LOW
RBC: 4.16
RDW: 14.7 — ABNORMAL HIGH
RDW: 13.6
RDW: 13.7
RDW: 14
RDW: 14.1 — ABNORMAL HIGH
RDW: 14.1 — ABNORMAL HIGH
RDW: 14.3 — ABNORMAL HIGH
WBC: 7.2
WBC: 5.9
WBC: 6.2
WBC: 6.5
WBC: 6.6
WBC: 6.8
WBC: 6.9

## 2011-09-03 LAB — APTT
aPTT: 23 — ABNORMAL LOW
aPTT: 31
aPTT: 58 — ABNORMAL HIGH

## 2011-09-03 LAB — PROTIME-INR
INR: 2.1 — ABNORMAL HIGH
INR: 2.2 — ABNORMAL HIGH
INR: 2.7 — ABNORMAL HIGH
INR: 2.9 — ABNORMAL HIGH
INR: 2.9 — ABNORMAL HIGH
INR: 3.2 — ABNORMAL HIGH
INR: 3.5 — ABNORMAL HIGH
INR: 1
INR: 1
INR: 1.1
Prothrombin Time: 27.4 — ABNORMAL HIGH
Prothrombin Time: 29.8 — ABNORMAL HIGH
Prothrombin Time: 30.6 — ABNORMAL HIGH
Prothrombin Time: 34.7 — ABNORMAL HIGH
Prothrombin Time: 13.8
Prothrombin Time: 14.6

## 2011-09-03 LAB — URINALYSIS, ROUTINE W REFLEX MICROSCOPIC
Glucose, UA: NEGATIVE
Protein, ur: 100 — AB
Urobilinogen, UA: 0.2

## 2011-09-03 LAB — POCT INR: INR: 2.1

## 2011-09-03 LAB — DIFFERENTIAL
Basophils Absolute: 0
Basophils Relative: 0
Basophils Relative: 0
Eosinophils Absolute: 0.2
Eosinophils Absolute: 0.1
Monocytes Absolute: 0.7
Monocytes Relative: 10
Neutro Abs: 4.7
Neutrophils Relative %: 69

## 2011-09-03 LAB — URINE CULTURE

## 2011-09-03 LAB — OCCULT BLOOD X 1 CARD TO LAB, STOOL: Fecal Occult Bld: NEGATIVE

## 2011-09-03 LAB — URINE MICROSCOPIC-ADD ON

## 2011-09-03 LAB — HEPARIN ANTIBODY SCREEN: Heparin Antibody Screen: POSITIVE

## 2011-09-03 LAB — HEPARIN ASSOCIATED ANTIBODY DETECTION (CONVERTED LAB): Heparin Associated Ab Detection: NEGATIVE

## 2011-09-05 ENCOUNTER — Ambulatory Visit: Payer: PRIVATE HEALTH INSURANCE | Admitting: Cardiology

## 2011-10-15 ENCOUNTER — Ambulatory Visit (INDEPENDENT_AMBULATORY_CARE_PROVIDER_SITE_OTHER): Payer: PRIVATE HEALTH INSURANCE | Admitting: *Deleted

## 2011-10-15 DIAGNOSIS — I4891 Unspecified atrial fibrillation: Secondary | ICD-10-CM

## 2011-10-23 ENCOUNTER — Other Ambulatory Visit: Payer: Self-pay | Admitting: Family Medicine

## 2011-10-23 NOTE — Telephone Encounter (Signed)
Refill request

## 2011-10-24 ENCOUNTER — Telehealth: Payer: Self-pay | Admitting: Family Medicine

## 2011-10-24 NOTE — Telephone Encounter (Signed)
Need verbal recert for continued nursing svs for the b12 injections.

## 2011-10-25 NOTE — Telephone Encounter (Signed)
Will need follow-up around 01/2012.

## 2011-10-25 NOTE — Telephone Encounter (Signed)
Left message. Resume B12 injections monthly.

## 2011-11-21 ENCOUNTER — Other Ambulatory Visit: Payer: Self-pay | Admitting: Family Medicine

## 2011-11-21 ENCOUNTER — Other Ambulatory Visit: Payer: Self-pay | Admitting: Cardiology

## 2011-11-21 NOTE — Telephone Encounter (Signed)
Refill request

## 2011-11-26 ENCOUNTER — Ambulatory Visit (INDEPENDENT_AMBULATORY_CARE_PROVIDER_SITE_OTHER): Payer: PRIVATE HEALTH INSURANCE | Admitting: *Deleted

## 2011-11-26 DIAGNOSIS — I4891 Unspecified atrial fibrillation: Secondary | ICD-10-CM

## 2011-11-26 LAB — POCT INR: INR: 2.8

## 2011-11-27 ENCOUNTER — Telehealth: Payer: Self-pay | Admitting: Family Medicine

## 2011-11-27 NOTE — Telephone Encounter (Signed)
Laura Griffith fell several weeks but found out today.  Landed on her face and don't remember fall and still have residual bruising under eyes.  She stated she felt fine.  Refused to go to ER day of fall and her family found her.  Appeared to be doing well today.  Refuses PT.  Today was the first day that Short knew of fall.   Was told to make a f/u appt with provider.  You can call her if there are any further questions.

## 2011-12-25 ENCOUNTER — Other Ambulatory Visit: Payer: Self-pay | Admitting: Family Medicine

## 2011-12-25 NOTE — Telephone Encounter (Signed)
Refill request

## 2011-12-31 ENCOUNTER — Telehealth: Payer: Self-pay | Admitting: Family Medicine

## 2011-12-31 NOTE — Telephone Encounter (Signed)
Need verbal order to continue giving b12 injections and incontinence supplies.  Call [employer] with order.

## 2011-12-31 NOTE — Telephone Encounter (Signed)
Left message with verbal orders 

## 2012-01-02 ENCOUNTER — Other Ambulatory Visit: Payer: Self-pay | Admitting: Thoracic Surgery (Cardiothoracic Vascular Surgery)

## 2012-01-02 DIAGNOSIS — I712 Thoracic aortic aneurysm, without rupture: Secondary | ICD-10-CM

## 2012-01-07 ENCOUNTER — Ambulatory Visit (INDEPENDENT_AMBULATORY_CARE_PROVIDER_SITE_OTHER): Payer: PRIVATE HEALTH INSURANCE | Admitting: *Deleted

## 2012-01-07 DIAGNOSIS — I4891 Unspecified atrial fibrillation: Secondary | ICD-10-CM

## 2012-01-07 DIAGNOSIS — Z7901 Long term (current) use of anticoagulants: Secondary | ICD-10-CM

## 2012-01-07 LAB — POCT INR: INR: 2.8

## 2012-01-19 ENCOUNTER — Other Ambulatory Visit: Payer: Self-pay | Admitting: Family Medicine

## 2012-01-20 NOTE — Telephone Encounter (Signed)
Refill request

## 2012-01-22 NOTE — Telephone Encounter (Signed)
Needs appointment

## 2012-02-13 ENCOUNTER — Ambulatory Visit
Admission: RE | Admit: 2012-02-13 | Discharge: 2012-02-13 | Disposition: A | Discharge status: Home / Self Care | Payer: PRIVATE HEALTH INSURANCE | Source: Ambulatory Visit | Attending: Thoracic Surgery (Cardiothoracic Vascular Surgery) | Admitting: Thoracic Surgery (Cardiothoracic Vascular Surgery)

## 2012-02-13 ENCOUNTER — Other Ambulatory Visit: Payer: Self-pay | Admitting: Thoracic Surgery (Cardiothoracic Vascular Surgery)

## 2012-02-13 DIAGNOSIS — I712 Thoracic aortic aneurysm, without rupture: Secondary | ICD-10-CM

## 2012-02-14 ENCOUNTER — Encounter: Payer: Self-pay | Admitting: *Deleted

## 2012-02-14 ENCOUNTER — Ambulatory Visit (INDEPENDENT_AMBULATORY_CARE_PROVIDER_SITE_OTHER): Payer: PRIVATE HEALTH INSURANCE | Admitting: Thoracic Surgery (Cardiothoracic Vascular Surgery)

## 2012-02-14 ENCOUNTER — Encounter: Payer: Self-pay | Admitting: Thoracic Surgery (Cardiothoracic Vascular Surgery)

## 2012-02-14 ENCOUNTER — Ambulatory Visit (INDEPENDENT_AMBULATORY_CARE_PROVIDER_SITE_OTHER): Payer: PRIVATE HEALTH INSURANCE | Admitting: *Deleted

## 2012-02-14 VITALS — BP 123/79 | HR 58 | Resp 20 | Ht 64.0 in | Wt 235.0 lb

## 2012-02-14 DIAGNOSIS — I4891 Unspecified atrial fibrillation: Secondary | ICD-10-CM

## 2012-02-14 DIAGNOSIS — Z7901 Long term (current) use of anticoagulants: Secondary | ICD-10-CM

## 2012-02-14 DIAGNOSIS — I712 Thoracic aortic aneurysm, without rupture: Secondary | ICD-10-CM

## 2012-02-14 NOTE — Progress Notes (Signed)
HPI: Laura Griffith is a 78 year old woman who we've been following since 2008 with a 5 cm ascending aortic aneurysm. She really has generalized arteriomegaly of the aorta and great vessels. This was initially found to is an incidental finding which is being worked up for different problem. She states that since her visit a year ago she still has very limited mobility. She says she has some chest pain off and on, typically with exertion, but occasionally at rest. She's not had any episodes that were prolonged or worrisome to her.  Past Medical History  Diagnosis Date  . Obesity   . Depression   . HTN (hypertension)   . Hyperlipemia   . Renal insufficiency     baseline 1.6-1.8 - Cr 0.7 (`00) 1.6  8/02  1.5 (2/03), Cr. 0.9  8/03 1.1 (11/05), Creatinine Clearance 8/02 about 50 ml/min MSE:  30/30 (09/2006)  . Aortic aneurysm     Followed by Dr. Dorris Fetch.  CT chest (3/11) with stable 4.8 cm ascending  aortic aneurysm.  Laura Griffith Atrial fibrillation     Noted initially at adenosine myoview 8/10.  She seems to be in chronic atrial fibrillation  at this point.  . Osteoarthritis   . Embolism - blood clot     insertion of vena cava filter     Current Outpatient Prescriptions  Medication Sig Dispense Refill  . acetaminophen (TYLENOL) 650 MG CR tablet Take 650 mg by mouth as needed.       . Calcium Carbonate-Vitamin D (CALCIUM-VITAMIN D) 500-200 MG-UNIT per tablet Take 1 tablet by mouth 2 (two) times daily with meals.        . CRESTOR 10 MG tablet TAKE ONE (1) TABLET EACH DAY  30 tablet  5  . cyanocobalamin (COBAL-1000) 1000 MCG/ML injection Inject 1 mL (1,000 mcg total) into the skin every 30 (thirty) days.  10 mL  1  . fish oil-omega-3 fatty acids 1000 MG capsule Take 2 g by mouth daily.        . hydrochlorothiazide (HYDRODIURIL) 25 MG tablet TAKE ONE (1) TABLET EACH DAY  30 tablet  4  . lidocaine (LIDODERM) 5 % Place 1 patch onto the skin daily. Remove & Discard patch within 12 hours or as directed by MD        . losartan (COZAAR) 100 MG tablet TAKE ONE (1) TABLET EACH DAY  30 tablet  4  . metoprolol (TOPROL-XL) 50 MG 24 hr tablet TAKE ONE AND ONE-HALF TABLETS DAILY  45 tablet  11  . oxybutynin (DITROPAN-XL) 5 MG 24 hr tablet TAKE ONE (1) TABLET EACH DAY  30 tablet  3  . sertraline (ZOLOFT) 100 MG tablet TAKE ONE (1) TABLET EACH DAY                                                  Generic for ZOLOFT 1  30 tablet  0  . traMADol (ULTRAM-ER) 200 MG 24 hr tablet TAKE ONE (1) TABLET EACH DAY FOR        ARTHRITIS  30 tablet  3  . warfarin (COUMADIN) 1 MG tablet TAKE AS DIRECTED BY ANTICOAGULATION     CLINIC. PATIENT TAKES UP TO 2TABLETS   DAILY  60 tablet  3  . DISCONTD: oxybutynin (DITROPAN) 5 MG tablet Take 5 mg by mouth daily.  Physical Exam: BP 123/79  Pulse 58  Resp 20  Ht 5\' 4"  (1.626 m)  Wt 235 lb (106.595 kg)  BMI 40.34 kg/m2  SpO2 93%  Gen. obese 78 year old woman in no acute distress Neurologic alert and oriented x3, no focal deficits Lungs clear Cardiac regular rate and rhythm, heart sounds difficult to discern, no obvious murmur No carotid bruits   Diagnostic Tests: MRA of the chest done 02/13/2012. Shows a stable 5 cm ascending aortic aneurysm, unchanged from her study year ago  Impression: 78 year old woman with a stable 5 cm ascending aortic aneurysm which has now been followed for 5 years. No surgical indication at this time. It is questionable whether she would be a candidate for surgery to be necessary.  Plan: Return in one year for repeat MRA.

## 2012-02-17 ENCOUNTER — Encounter: Payer: Self-pay | Admitting: Family Medicine

## 2012-02-17 DIAGNOSIS — I712 Thoracic aortic aneurysm, without rupture: Secondary | ICD-10-CM

## 2012-02-19 ENCOUNTER — Other Ambulatory Visit: Payer: Self-pay | Admitting: Family Medicine

## 2012-02-19 ENCOUNTER — Telehealth: Payer: Self-pay | Admitting: *Deleted

## 2012-02-19 ENCOUNTER — Other Ambulatory Visit: Payer: Self-pay

## 2012-02-19 ENCOUNTER — Other Ambulatory Visit: Payer: Self-pay | Admitting: Cardiology

## 2012-02-19 NOTE — Telephone Encounter (Signed)
[name] with [facility] calling to get verbal order to continue going out and doing monthly B12 injections for Laura Griffith. Their orders have expired.   Verbal okay given.  They will fax over forms for Dr. Madolyn Frieze to sign.  Ileana Ladd

## 2012-02-19 NOTE — Telephone Encounter (Signed)
..   Requested Prescriptions   Pending Prescriptions Disp Refills  . metoprolol succinate (TOPROL-XL) 50 MG 24 hr tablet [Pharmacy Med Name: METOPROLOL SUCCINATE 50MG  TER] 45 tablet 1    Sig: TAKE 1 AND 1/2 TABLETS TABLETS DAILY.  Marland Kitchen CRESTOR 10 MG tablet [Pharmacy Med Name: CRESTOR 10MG  TAB] 30 tablet     Sig: TAKE ONE (1) TABLET EACH DAY

## 2012-02-19 NOTE — Telephone Encounter (Signed)
Have to call rx into the pharm Metoprolol succinate 50 mg 45 tabs with 1 refill

## 2012-02-20 ENCOUNTER — Telehealth: Payer: Self-pay

## 2012-02-20 NOTE — Telephone Encounter (Signed)
Also called the patient about making appointment, but did not get an answer.(phone busy)

## 2012-03-22 ENCOUNTER — Other Ambulatory Visit: Payer: Self-pay | Admitting: Family Medicine

## 2012-03-22 ENCOUNTER — Other Ambulatory Visit: Payer: Self-pay | Admitting: Cardiology

## 2012-03-24 ENCOUNTER — Telehealth: Payer: Self-pay | Admitting: *Deleted

## 2012-03-24 NOTE — Telephone Encounter (Signed)
PA required for [facility].Marland KitchenForm placed in MD box.

## 2012-03-24 NOTE — Telephone Encounter (Signed)
Needs appointment within 3 months

## 2012-03-25 NOTE — Telephone Encounter (Signed)
Faxed back. Tramadol 50 mg, 2 tablets bid prn, #60.

## 2012-03-31 ENCOUNTER — Ambulatory Visit (INDEPENDENT_AMBULATORY_CARE_PROVIDER_SITE_OTHER): Payer: Medicaid Other | Admitting: *Deleted

## 2012-03-31 DIAGNOSIS — I4891 Unspecified atrial fibrillation: Secondary | ICD-10-CM

## 2012-03-31 DIAGNOSIS — Z7901 Long term (current) use of anticoagulants: Secondary | ICD-10-CM

## 2012-04-17 ENCOUNTER — Telehealth: Payer: Self-pay | Admitting: *Deleted

## 2012-04-17 NOTE — Telephone Encounter (Signed)
Received call from Raford Pitcher at [facility].  They have been seeing patient and providing B12 injections, incontinence supplies, and managing pernicious anemia.  Patient's certification ends this week and they need recertification orders.   They can take a verbal order and will send Form 485 afterwards with details of the services being provided.  Will route note to Dr. Madolyn Frieze and call [facility] back.  Gaylene Brooks, RN

## 2012-04-18 NOTE — Telephone Encounter (Signed)
Is calling again - did assessment yesterday and has puffiness over left eye - not sure if she needs to be seen  Laura Griffith has nothing available in the month of June

## 2012-04-21 ENCOUNTER — Telehealth: Payer: Self-pay | Admitting: *Deleted

## 2012-04-21 ENCOUNTER — Other Ambulatory Visit: Payer: Self-pay | Admitting: Cardiology

## 2012-04-21 ENCOUNTER — Other Ambulatory Visit: Payer: Self-pay | Admitting: Family Medicine

## 2012-04-21 NOTE — Telephone Encounter (Signed)
Spoke to Vance from [facility] and verbal orders were given for supplies and B12.

## 2012-04-21 NOTE — Telephone Encounter (Signed)
[name],  Nurse with [facility] needs recerticifaction orders for patient to provide inconvenience supplies and B12 injections. If they don't get today they will have to discharge. Will send message to dr. Tye Savoy in Dr. Bluford Kaufmann [place]'s absence.

## 2012-04-28 ENCOUNTER — Ambulatory Visit (INDEPENDENT_AMBULATORY_CARE_PROVIDER_SITE_OTHER): Payer: 59

## 2012-04-28 DIAGNOSIS — I4891 Unspecified atrial fibrillation: Secondary | ICD-10-CM

## 2012-04-28 DIAGNOSIS — Z7901 Long term (current) use of anticoagulants: Secondary | ICD-10-CM

## 2012-05-12 ENCOUNTER — Ambulatory Visit (INDEPENDENT_AMBULATORY_CARE_PROVIDER_SITE_OTHER): Payer: Medicaid Other | Admitting: Family Medicine

## 2012-05-12 ENCOUNTER — Encounter: Payer: Self-pay | Admitting: Family Medicine

## 2012-05-12 VITALS — BP 122/83 | HR 88 | Temp 97.8°F | Ht 64.0 in | Wt 230.0 lb

## 2012-05-12 DIAGNOSIS — I10 Essential (primary) hypertension: Secondary | ICD-10-CM

## 2012-05-12 DIAGNOSIS — G589 Mononeuropathy, unspecified: Secondary | ICD-10-CM

## 2012-05-12 DIAGNOSIS — K625 Hemorrhage of anus and rectum: Secondary | ICD-10-CM

## 2012-05-12 DIAGNOSIS — M1990 Unspecified osteoarthritis, unspecified site: Secondary | ICD-10-CM

## 2012-05-12 DIAGNOSIS — R202 Paresthesia of skin: Secondary | ICD-10-CM

## 2012-05-12 DIAGNOSIS — R209 Unspecified disturbances of skin sensation: Secondary | ICD-10-CM

## 2012-05-12 DIAGNOSIS — E785 Hyperlipidemia, unspecified: Secondary | ICD-10-CM

## 2012-05-12 LAB — BASIC METABOLIC PANEL
BUN: 32 mg/dL — ABNORMAL HIGH (ref 6–23)
CO2: 27 mEq/L (ref 19–32)
Chloride: 105 mEq/L (ref 96–112)
Creat: 1.16 mg/dL — ABNORMAL HIGH (ref 0.50–1.10)

## 2012-05-12 LAB — LIPID PANEL
LDL Cholesterol: 78 mg/dL (ref 0–99)
Total CHOL/HDL Ratio: 3.8 Ratio
VLDL: 36 mg/dL (ref 0–40)

## 2012-05-12 MED ORDER — METOPROLOL SUCCINATE ER 50 MG PO TB24: ORAL_TABLET | ORAL | Status: DC

## 2012-05-12 MED ORDER — SERTRALINE HCL 100 MG PO TABS: 100.0000 mg | ORAL_TABLET | Freq: Every day | ORAL | Status: DC

## 2012-05-12 MED ORDER — ACETAMINOPHEN ER 650 MG PO TBCR: 650.0000 mg | EXTENDED_RELEASE_TABLET | ORAL | Status: DC | PRN

## 2012-05-12 MED ORDER — TRAMADOL HCL ER 200 MG PO TB24: 200.0000 mg | ORAL_TABLET | Freq: Every day | ORAL | Status: DC

## 2012-05-12 MED ORDER — TRAMADOL HCL 50 MG PO TABS: 100.0000 mg | ORAL_TABLET | Freq: Two times a day (BID) | ORAL | Status: DC

## 2012-05-12 MED ORDER — OXYBUTYNIN CHLORIDE ER 5 MG PO TB24: 5.0000 mg | ORAL_TABLET | Freq: Every day | ORAL | Status: DC

## 2012-05-12 MED ORDER — HYDROCHLOROTHIAZIDE 25 MG PO TABS: 25.0000 mg | ORAL_TABLET | Freq: Every day | ORAL | Status: DC

## 2012-05-12 MED ORDER — CALCIUM-VITAMIN D 500-200 MG-UNIT PO TABS: 1.0000 | ORAL_TABLET | Freq: Two times a day (BID) | ORAL | Status: AC

## 2012-05-12 MED ORDER — METOPROLOL SUCCINATE ER 50 MG PO TB24: 50.0000 mg | ORAL_TABLET | Freq: Every day | ORAL | Status: DC

## 2012-05-12 MED ORDER — OMEGA-3 FATTY ACIDS 1000 MG PO CAPS: 2.0000 g | ORAL_CAPSULE | Freq: Every day | ORAL | Status: AC

## 2012-05-12 MED ORDER — LOSARTAN POTASSIUM 100 MG PO TABS: 100.0000 mg | ORAL_TABLET | Freq: Every day | ORAL | Status: DC

## 2012-05-12 NOTE — Patient Instructions (Addendum)
Follow-up in [facility].   Please schedule a colonoscopy. I would call over to [facility] and schedule with them since you have been there before.  If your lab results are normal, I will send you a letter with the results. If abnormal, someone at the clinic will get in touch with you.

## 2012-05-12 NOTE — Assessment & Plan Note (Signed)
She thinks this is from hemorrhoids. Scant blood on her Depends occasionally. We did not have time to discuss this at length today. She does have regular bowel movements. She is about due for her colonoscopy (previously done at [facility] she thinks), so advised her to make an appointment for repeat routine colonoscopy to rule-out other GI process (e.g., cancer). Patient says she will think about it.

## 2012-05-12 NOTE — Assessment & Plan Note (Addendum)
Patient complaining of persistent tingling in feet. We did not have time to discuss this at length, but her sensation is grossly intact.  Will check HgbA1c today>>>5.8 Advised to return to clinic if symptoms persistent/worsen and given indications to seek prompt care.

## 2012-05-12 NOTE — Progress Notes (Signed)
  Subjective:    Patient ID: Laura Griffith, female    DOB: [birth year], 78 y.o.   MRN: [id number]  HPI Medication refills.  1. HTN. Compliant with medications. Denies headache, dizziness, occasional chest pain (cardiologist aware of this) that improves with rest.   2. HLD Compliant. No RUQ pain, myalgias.  3. Osteoarthritis of hands Present but Tramadol/Tylenol helps.  Acute issues: 1. Tingling in her feet. No numbness. No stool incontinence. Urinary incontinence at baseline; no changes. No back pain. No recent falls. Uses walker most of the time.   2. Rectal bleeding She has hemorrhoids. She notices spotting on her Depends sometimes. Denies pain. Regular daily bowel movements; not hard.  Denies lightheadedness.  Review of Systems Per HPI.  Past Medical History, Family History, Social History, Allergies, and Medications reviewed. Significant for AF on Coumadin (managed by Cards), diverticulosis of colon, chronic renal insufficiency.     Objective:   Physical Exam GEN: NAD; accompanied by daughter; well-appearing; morbidly obese PSYCH: normally conversant, appropriate to questions, good sense of humor CV: irregularly irregular PULM: mildly increased WOB with activity; CTAB without w/r/r ABD: NABS, soft, NT, obese MSK: bony changes at DIP bilaterally; non-tender, non-warm, non-erythematous GU: deferred EXT: hypertrophic skin bilaterally; no calf tenderness; varicose veins around ankles; 2+ pedal pulses; sensation intact dorsum/plantar/lateral/medial foot and leg bilaterally SKIN: cool, dry    Assessment & Plan:

## 2012-05-12 NOTE — Assessment & Plan Note (Signed)
Controlled.  Continue current medications.

## 2012-05-12 NOTE — Assessment & Plan Note (Signed)
Checking lipids today.

## 2012-05-12 NOTE — Addendum Note (Signed)
Addended by: Madolyn Frieze, Marylene Land J on: 05/12/2012 01:40 PM   Modules accepted: Orders

## 2012-05-12 NOTE — Assessment & Plan Note (Signed)
Controlled with Tylenol/Tramadol.

## 2012-05-13 ENCOUNTER — Encounter: Payer: Self-pay | Admitting: Family Medicine

## 2012-05-19 ENCOUNTER — Other Ambulatory Visit: Payer: Self-pay | Admitting: Family Medicine

## 2012-05-19 ENCOUNTER — Other Ambulatory Visit: Payer: Self-pay | Admitting: Cardiology

## 2012-05-20 ENCOUNTER — Telehealth: Payer: Self-pay | Admitting: Family Medicine

## 2012-05-20 NOTE — Telephone Encounter (Signed)
Please notify patient that her insurance no longer covers the Lidoderm patches. Please ask her to make an appointment so we can discuss alternative ways of managing her pain.

## 2012-05-26 ENCOUNTER — Ambulatory Visit (INDEPENDENT_AMBULATORY_CARE_PROVIDER_SITE_OTHER): Payer: 59 | Admitting: *Deleted

## 2012-05-26 DIAGNOSIS — I4891 Unspecified atrial fibrillation: Secondary | ICD-10-CM

## 2012-05-26 DIAGNOSIS — Z7901 Long term (current) use of anticoagulants: Secondary | ICD-10-CM

## 2012-06-12 ENCOUNTER — Telehealth: Payer: Self-pay | Admitting: *Deleted

## 2012-06-12 DIAGNOSIS — E538 Deficiency of other specified B group vitamins: Secondary | ICD-10-CM

## 2012-06-12 NOTE — Telephone Encounter (Signed)
Received call from Raford Pitcher nurse with [facility] stating she needs re certification for patient since her period of certification is ending  for B12 injections and incontinence supplies.   Can call back and give verbal order to [phone]. Will forward to MD.

## 2012-06-17 NOTE — Telephone Encounter (Signed)
Left message with Ms. Laura Griffith.  Yes to incontinence supplies. No to B12 injections since normal (700s) 10 months ago. Advised patient make lab appointment to get B12 level checked. Will put in future order.

## 2012-06-20 ENCOUNTER — Other Ambulatory Visit: Payer: Self-pay | Admitting: Cardiology

## 2012-06-23 ENCOUNTER — Other Ambulatory Visit: Payer: 59

## 2012-06-23 ENCOUNTER — Ambulatory Visit (INDEPENDENT_AMBULATORY_CARE_PROVIDER_SITE_OTHER): Payer: 59 | Admitting: *Deleted

## 2012-06-23 DIAGNOSIS — Z7901 Long term (current) use of anticoagulants: Secondary | ICD-10-CM

## 2012-06-23 DIAGNOSIS — E538 Deficiency of other specified B group vitamins: Secondary | ICD-10-CM

## 2012-06-23 DIAGNOSIS — I4891 Unspecified atrial fibrillation: Secondary | ICD-10-CM

## 2012-06-23 LAB — VITAMIN B12: Vitamin B-12: 448 pg/mL (ref 211–911)

## 2012-06-23 LAB — POCT INR: INR: 3.1

## 2012-06-23 NOTE — Progress Notes (Signed)
B12 DONE TODAY Laura Griffith 

## 2012-06-24 NOTE — Progress Notes (Signed)
B12 level WNL with injections. Will continue injections.  BLUE TEAM: will you please call patient and notify that injections will be continued and will you call [phone] and give her OK for injections for 1 year? Thank you.

## 2012-06-24 NOTE — Progress Notes (Signed)
Called number provided and gave [name] (nurse at [facility]) verbal order to continue B12 injections.

## 2012-07-23 ENCOUNTER — Ambulatory Visit (INDEPENDENT_AMBULATORY_CARE_PROVIDER_SITE_OTHER): Payer: 59 | Admitting: Pharmacist

## 2012-07-23 DIAGNOSIS — I4891 Unspecified atrial fibrillation: Secondary | ICD-10-CM

## 2012-07-23 DIAGNOSIS — Z7901 Long term (current) use of anticoagulants: Secondary | ICD-10-CM

## 2012-07-24 ENCOUNTER — Telehealth: Payer: Self-pay | Admitting: *Deleted

## 2012-07-24 DIAGNOSIS — M1990 Unspecified osteoarthritis, unspecified site: Secondary | ICD-10-CM

## 2012-07-24 NOTE — Telephone Encounter (Signed)
PA required for Lidoderm patch. Form placed in MD box. 

## 2012-07-25 NOTE — Telephone Encounter (Signed)
Patient uses lidoderm patch for leg pain, thought to be due to arthritis.  She has tried OTC analgesics (Tylenol, ibuprofen, Motrin, Alleve) and they do not help. She has tried Tramadol, but it does not help as much as patch.  The patch allows her to be more mobile.

## 2012-07-25 NOTE — Assessment & Plan Note (Signed)
PA for lidoderm patch. Patient uses lidoderm patch for leg pain, thought to be due to arthritis.  She has tried OTC analgesics (Tylenol, ibuprofen, Motrin, Alleve) and they do not help. She has tried Tramadol, but it does not help as much as patch.  The patch allows her to be more mobile.

## 2012-07-28 NOTE — Telephone Encounter (Signed)
Denial received from insurance regarding Lidoderm patch.  Placed in MD box.

## 2012-08-13 ENCOUNTER — Telehealth: Payer: Self-pay | Admitting: *Deleted

## 2012-08-13 DIAGNOSIS — E538 Deficiency of other specified B group vitamins: Secondary | ICD-10-CM

## 2012-08-13 NOTE — Telephone Encounter (Signed)
[name] calling from [facility].  States patient is due for re certification for B12 injections and urinary incontinence supplies.  Questioning if patient can start coming to office to receive her monthly B-12 shots or do they still need to be going out to her home to do these for her.  States they can still supply incontinence supplies for patient.  If she needs to continue going out to do the B-12 shots, she will need new orders.  Will forward to Dr. Madolyn Frieze for review.  Ileana Ladd

## 2012-08-14 NOTE — Telephone Encounter (Signed)
Spoke with [employer] and gave verbal for incontinence supplies.  Discussed B12 issue.  Gave verbal for 60 more days, and Waynetta Sandy will try to talk pt into coming to office before next recert is needed. Martyn Timme, Maryjo Rochester

## 2012-08-14 NOTE — Telephone Encounter (Signed)
BLUE TEAM: Will you please call the patient and inform her that she can get her B-12 shots at the clinic and ask her to make an appointment for a nurse visit and a lab visit so we can check her B-12 level (it needs to be checked before her shot).  Will you also call home health and give verbal for incontinence supplies?  Thank you.   (If transportation is a major issue for the patient, it would be okay to give verbal for continued monthly B-12 injections at home).

## 2012-08-14 NOTE — Telephone Encounter (Signed)
Laura Griffith is calling back because the certification ends today and she needs to know if she needs to continue.  The question is, will the patient be able to get into the office for the B12.

## 2012-08-18 ENCOUNTER — Ambulatory Visit (INDEPENDENT_AMBULATORY_CARE_PROVIDER_SITE_OTHER): Payer: 59 | Admitting: *Deleted

## 2012-08-18 DIAGNOSIS — Z7901 Long term (current) use of anticoagulants: Secondary | ICD-10-CM

## 2012-08-18 DIAGNOSIS — I4891 Unspecified atrial fibrillation: Secondary | ICD-10-CM

## 2012-08-25 ENCOUNTER — Other Ambulatory Visit: Payer: Self-pay | Admitting: *Deleted

## 2012-08-25 ENCOUNTER — Other Ambulatory Visit: Payer: Self-pay | Admitting: Cardiology

## 2012-08-25 MED ORDER — METOPROLOL SUCCINATE ER 50 MG PO TB24: ORAL_TABLET | ORAL | Status: DC

## 2012-08-25 NOTE — Telephone Encounter (Signed)
Spoke with Pt she is aware that she is overdue for 6 month f/u with Dr. Shirlee Latch she has not been seen over a year. Forwarded call to [facility] office to schedule appointment. Refilled Metoprolol.

## 2012-09-15 ENCOUNTER — Ambulatory Visit (INDEPENDENT_AMBULATORY_CARE_PROVIDER_SITE_OTHER): Payer: 59 | Admitting: *Deleted

## 2012-09-15 DIAGNOSIS — I4891 Unspecified atrial fibrillation: Secondary | ICD-10-CM

## 2012-09-15 DIAGNOSIS — Z7901 Long term (current) use of anticoagulants: Secondary | ICD-10-CM

## 2012-09-18 ENCOUNTER — Telehealth: Payer: Self-pay | Admitting: Family Medicine

## 2012-09-18 NOTE — Telephone Encounter (Signed)
Has been seeing patient in home for B12 shot and they her to transition to here.  Needs orders to be able to do this and also needs to know if the is OK

## 2012-09-19 NOTE — Telephone Encounter (Signed)
Left message with [name] I asked her to have patient schedule lab appointment to check B12 around the time she is due for her next injection to determine whether these injections should be continued

## 2012-09-22 ENCOUNTER — Ambulatory Visit (INDEPENDENT_AMBULATORY_CARE_PROVIDER_SITE_OTHER): Payer: 59 | Admitting: Cardiology

## 2012-09-22 ENCOUNTER — Ambulatory Visit (INDEPENDENT_AMBULATORY_CARE_PROVIDER_SITE_OTHER): Payer: 59 | Admitting: *Deleted

## 2012-09-22 ENCOUNTER — Encounter: Payer: Self-pay | Admitting: Cardiology

## 2012-09-22 VITALS — BP 110/64 | HR 103 | Ht 64.0 in | Wt 235.0 lb

## 2012-09-22 DIAGNOSIS — I712 Thoracic aortic aneurysm, without rupture: Secondary | ICD-10-CM

## 2012-09-22 DIAGNOSIS — I351 Nonrheumatic aortic (valve) insufficiency: Secondary | ICD-10-CM

## 2012-09-22 DIAGNOSIS — I4891 Unspecified atrial fibrillation: Secondary | ICD-10-CM

## 2012-09-22 DIAGNOSIS — I359 Nonrheumatic aortic valve disorder, unspecified: Secondary | ICD-10-CM

## 2012-09-22 DIAGNOSIS — Z23 Encounter for immunization: Secondary | ICD-10-CM

## 2012-09-22 DIAGNOSIS — I10 Essential (primary) hypertension: Secondary | ICD-10-CM

## 2012-09-22 NOTE — Patient Instructions (Addendum)
Your physician has requested that you have an echocardiogram. Echocardiography is a painless test that uses sound waves to create images of your heart. It provides your doctor with information about the size and shape of your heart and how well your heart's chambers and valves are working. This procedure takes approximately one hour. There are no restrictions for this procedure. In the next week or so.   Dr Shirlee Latch recommends you have an MRA of your chest in March 2014.   Your physician recommends that you schedule a follow-up appointment in: 6 months with Dr Shirlee Latch. (May 2014).   You can go to [facility] when you are finished here and have your flu vaccine today. I spoke with Lyla Son at [facility] [phone].

## 2012-09-22 NOTE — Progress Notes (Signed)
Patient ID: Laura Griffith, female   DOB: [birth year], 78 y.o.   MRN: [id number] PCP: Dr. Bluford Kaufmann  78 yo with history of ascending aortic aneurysm, obesity, atrial fibrillation, and significant osteoarthritis presents for cardiology followup.  She has an ascending aortic aneurysm measuring 4.9 cm (stable size) that is being followed by Dr. Dorris Fetch.  Last study was a chest MRA in 3/13.  She is living at home but has help.  She is sedentary and uses a walker to get around for stability.  She has pain in her knees, lower legs, and ankles.  She also has bilateral shoulder pain from rotator cuff problems and has difficulty abducting her arms.  She is morbidly obese.  She is not short of breath walking slowly with her walker but will become short of breath if she "rushes."  She does light housework and cooks and gets mildly short of breath with heavier housework.  Denies orthopnea.  She has been steady on her feet with no falls and is tolerating the coumadin ok.  She is in atrial fibrillation today.  HR is elevated at around 100 but she did not take her Toprol XL this morning as she was hurrying to get over here.  She gets "twinges" of chest pain that are nonexertional and last only for seconds.  BP is under good control.   ECG: atrial fibrillation at 103 with IVCD  Labs (12/11): K 4.4, creatinine 1.2, LDL 66, HDL 44 Labs (9/12): K 4.1, creatinine 1.16, LDL 69 Labs (6/13): K 3.8, creatinine 1.16, LDL 78, HDL 41  Allergies:  1)  Sulfamethoxazole (Sulfamethoxazole)  Past Medical History: 1. Obesity 2. Depression 3. HTN 4. Osteoarthritis with pain in multiple joints 5. Hyperlipidemia 6. CKD 7. h/o PE: several years ago per patient. 8. Ascending aortic aneurysm: Followed by Dr. Dorris Fetch.  CT chest (3/11) with stable 4.8 cm ascending aortic aneurysm.  CTA (3/12) with 4.9 cm ascending aortic aneurysm.  9. Echo (7/10): Technically difficult study.  Septal and inferolateral hypokinesis.  EF "moderately  decreased." Mildly dilated ascending aorta.  Mild aortic insufficiency.  10.  Adenosine myoview (8/10): Done because of technically difficult echo with ? LV systolic dysfunction and wall motion abnormalities.  This study showed normal EF 63% and normal wall motion.  No perfusion defect so no evidence of ischemia/infarction. 11.  Atrial fibrillation: Noted initially at adenosine myoview 8/10.  She seems to be in chronic atrial fibrillation at this point.   Family History: Longevity.  No CVA, no CAD, no neurologic dz.  Breast Cancer in mother (57).  4 sisters alive and well. No h/o fracture  Social History: Lives alone.  Children live within 1 mile of her & very supportive and involved in her care.  No EtOH, no tobacco.  Widowed.  Walks with walker.    ROS:  All systems reviewed and negative except as per HPI.   Current Outpatient Prescriptions  Medication Sig Dispense Refill  . acetaminophen (TYLENOL) 650 MG CR tablet Take 1 tablet (650 mg total) by mouth as needed.  30 tablet  6  . Calcium Carbonate-Vitamin D (CALCIUM-VITAMIN D) 500-200 MG-UNIT per tablet Take 1 tablet by mouth 2 (two) times daily with a meal.  60 tablet  6  . CRESTOR 10 MG tablet TAKE ONE (1) TABLET EACH DAY  30 tablet  5  . cyanocobalamin (COBAL-1000) 1000 MCG/ML injection Inject 1 mL (1,000 mcg total) into the skin every 30 (thirty) days.  10 mL  1  .  fish oil-omega-3 fatty acids 1000 MG capsule Take 2 capsules (2 g total) by mouth daily.  60 capsule  6  . hydrochlorothiazide (HYDRODIURIL) 25 MG tablet Take 1 tablet (25 mg total) by mouth daily.  30 tablet  6  . LIDODERM 5 % APPLY 1 PATCH TO AFFECTED AREA FOR UP TO12 HOURS IN A 24 HOUR PERIOD AS NEEDED  FOR PAIN.  30 each  0  . losartan (COZAAR) 100 MG tablet Take 1 tablet (100 mg total) by mouth daily.  30 tablet  6  . metoprolol succinate (TOPROL-XL) 50 MG 24 hr tablet Take 1.5 tablets with or immediately following a meal.  45 tablet  1  . oxybutynin (DITROPAN-XL) 5 MG  24 hr tablet Take 1 tablet (5 mg total) by mouth daily.  30 tablet  6  . sertraline (ZOLOFT) 100 MG tablet Take 1 tablet (100 mg total) by mouth daily.  30 tablet  6  . warfarin (COUMADIN) 1 MG tablet TAKE AS DIRECTED BY ANTICOAGULATION     CLINIC. PATIENT TAKES UP TO 2TABLETS   DAILY  60 tablet  3  . [DISCONTINUED] metoprolol succinate (TOPROL-XL) 50 MG 24 hr tablet TAKE 1 AND 1/2 TABLETS DAILY  45 tablet  1    BP 110/64  Pulse 103  Ht 5\' 4"  (1.626 m)  Wt 235 lb (106.595 kg)  BMI 40.34 kg/m2 General:  Morbidly obese.  No acute distress Neck:  Neck supple, JVP 8 cm. No masses, thyromegaly or abnormal cervical nodes. Lungs:  Clear bilaterally to auscultation and percussion. Heart:  Non-displaced PMI, chest non-tender; mildly tachy, irregular rate and rhythm, S1, S2. 2/6 systolic murmur RUSB.  Carotid upstroke normal, no bruit.  Pedals normal pulses. Trace ankle edema with varicosities.   Abdomen:  Bowel sounds positive; abdomen soft and non-tender without masses, organomegaly, or hernias noted. No hepatosplenomegaly. Extremities:  No clubbing or cyanosis. Neurologic:  Alert and oriented x 3. Psych:  Normal affect.  Assessment/Plan:  CHF, EJECTION FRACTION > OR = 50% Chronic diastolic CHF. Volume status looks ok. She is short of breath with moderate exertion, but this may have more to do with obesity and deconditioning.  ANEURYSM, THORACIC AORTIC Stable 4.9 cm ascending aortic aneurysm. She would be a poor surgical candidate. Followup with MRA chest in 3/14.  HYPERTENSION BP is under reasonable control. Continue current medications.  ATRIAL FIBRILLATION  Chronic atrial fibrillation on coumadin. No falls. HR mildly elevated but has not taken Toprol XL today.   Aortic insufficiency Repeat echo today to follow AI.   [name] 09/22/2012

## 2012-09-29 ENCOUNTER — Ambulatory Visit (INDEPENDENT_AMBULATORY_CARE_PROVIDER_SITE_OTHER): Payer: 59 | Admitting: *Deleted

## 2012-09-29 ENCOUNTER — Ambulatory Visit (HOSPITAL_COMMUNITY): Payer: Medicare HMO | Attending: Cardiology

## 2012-09-29 DIAGNOSIS — I4891 Unspecified atrial fibrillation: Secondary | ICD-10-CM

## 2012-09-29 DIAGNOSIS — I10 Essential (primary) hypertension: Secondary | ICD-10-CM | POA: Insufficient documentation

## 2012-09-29 DIAGNOSIS — E669 Obesity, unspecified: Secondary | ICD-10-CM | POA: Insufficient documentation

## 2012-09-29 DIAGNOSIS — I359 Nonrheumatic aortic valve disorder, unspecified: Secondary | ICD-10-CM

## 2012-09-29 DIAGNOSIS — I080 Rheumatic disorders of both mitral and aortic valves: Secondary | ICD-10-CM | POA: Insufficient documentation

## 2012-09-29 DIAGNOSIS — Z7901 Long term (current) use of anticoagulants: Secondary | ICD-10-CM

## 2012-09-29 DIAGNOSIS — I351 Nonrheumatic aortic (valve) insufficiency: Secondary | ICD-10-CM

## 2012-09-29 DIAGNOSIS — I079 Rheumatic tricuspid valve disease, unspecified: Secondary | ICD-10-CM | POA: Insufficient documentation

## 2012-09-29 LAB — POCT INR: INR: 2.2

## 2012-09-29 NOTE — Progress Notes (Signed)
Echocardiogram performed.  

## 2012-10-13 ENCOUNTER — Telehealth: Payer: Self-pay | Admitting: *Deleted

## 2012-10-13 NOTE — Telephone Encounter (Signed)
Verbal order given  

## 2012-10-13 NOTE — Telephone Encounter (Signed)
[name] with [facility] calling.   States they have been going out and doing Ms. Vandermeulen B-12 injections and incontinence supplies.  They have been trying to transition  Ms. Rouse to come in to our office for her B-12 injections and a lab appointment to have her B-12 level checked to see if she even needs to continue with the injections.  They have been unable to get patient into our office due to transportation issues.  Waynetta Sandy is requesting orders for re-certification of home care until they can get her in to see [place].  [name] states she can take a verbal order.  She can be reached at [phone].  Will forward to Dr. Madolyn Frieze for orders.  Ileana Ladd

## 2012-10-20 ENCOUNTER — Ambulatory Visit (INDEPENDENT_AMBULATORY_CARE_PROVIDER_SITE_OTHER): Payer: 59 | Admitting: *Deleted

## 2012-10-20 DIAGNOSIS — Z7901 Long term (current) use of anticoagulants: Secondary | ICD-10-CM

## 2012-10-20 DIAGNOSIS — I4891 Unspecified atrial fibrillation: Secondary | ICD-10-CM

## 2012-10-20 LAB — POCT INR: INR: 2.3

## 2012-10-27 ENCOUNTER — Other Ambulatory Visit: Payer: Self-pay | Admitting: *Deleted

## 2012-10-27 MED ORDER — METOPROLOL SUCCINATE ER 50 MG PO TB24: ORAL_TABLET | ORAL | Status: DC

## 2012-10-29 ENCOUNTER — Other Ambulatory Visit: Payer: 59

## 2012-10-29 DIAGNOSIS — E538 Deficiency of other specified B group vitamins: Secondary | ICD-10-CM

## 2012-10-29 LAB — VITAMIN B12: Vitamin B-12: 641 pg/mL (ref 211–911)

## 2012-10-29 NOTE — Progress Notes (Signed)
B12 DONE TODAY Laura Griffith

## 2012-11-17 ENCOUNTER — Ambulatory Visit (INDEPENDENT_AMBULATORY_CARE_PROVIDER_SITE_OTHER): Payer: 59

## 2012-11-17 DIAGNOSIS — I4891 Unspecified atrial fibrillation: Secondary | ICD-10-CM

## 2012-11-17 DIAGNOSIS — Z7901 Long term (current) use of anticoagulants: Secondary | ICD-10-CM

## 2012-11-17 LAB — POCT INR: INR: 2.1

## 2012-11-24 ENCOUNTER — Other Ambulatory Visit: Payer: Self-pay | Admitting: Cardiology

## 2012-12-12 ENCOUNTER — Other Ambulatory Visit: Payer: Self-pay | Admitting: Family Medicine

## 2012-12-12 ENCOUNTER — Telehealth: Payer: Self-pay | Admitting: Family Medicine

## 2012-12-12 DIAGNOSIS — E538 Deficiency of other specified B group vitamins: Secondary | ICD-10-CM

## 2012-12-12 NOTE — Telephone Encounter (Signed)
[name] called back and was at the patient's house.  Was asking for clarification on the B12 since she was there.   Gave her a verbal order to give the B12 today and will ask Dr. Madolyn Frieze about continuation of the B12 and whether future injections will be at home or in the clinic.

## 2012-12-12 NOTE — Telephone Encounter (Signed)
Is going out to pt home this morning and wants to know if she needs to give her B12 injection or d/c her.

## 2012-12-12 NOTE — Telephone Encounter (Signed)
Called [name] back and told her I had routed a refill request on the B12 to Dr. Madolyn Frieze.  Waiting for her to respond to the request.  [facility] either needs recert orders for the B12 injection or orders to d/c services.

## 2012-12-15 MED ORDER — CYANOCOBALAMIN 1000 MCG/ML IJ SOLN: 1000.0000 ug | INTRAMUSCULAR | Status: DC

## 2012-12-15 NOTE — Telephone Encounter (Signed)
LVM with Laura Griffith.  Need for vitamin B 12 injections has been a long standing issue.  We will address this at follow-up 02/17.  -check vitamin B-12 (cbl) levels -is there a reason why she is losing or not absorbing vitamin B-12?  For now, OK for her to get via [facility].

## 2012-12-25 ENCOUNTER — Other Ambulatory Visit: Payer: Self-pay | Admitting: Family Medicine

## 2012-12-25 ENCOUNTER — Other Ambulatory Visit: Payer: Self-pay | Admitting: Cardiology

## 2012-12-29 ENCOUNTER — Other Ambulatory Visit: Payer: Self-pay | Admitting: *Deleted

## 2012-12-29 ENCOUNTER — Ambulatory Visit (INDEPENDENT_AMBULATORY_CARE_PROVIDER_SITE_OTHER): Payer: 59 | Admitting: *Deleted

## 2012-12-29 DIAGNOSIS — I4891 Unspecified atrial fibrillation: Secondary | ICD-10-CM

## 2012-12-29 DIAGNOSIS — Z7901 Long term (current) use of anticoagulants: Secondary | ICD-10-CM

## 2012-12-29 DIAGNOSIS — I712 Thoracic aortic aneurysm, without rupture: Secondary | ICD-10-CM

## 2013-01-05 ENCOUNTER — Encounter: Payer: Self-pay | Admitting: Family Medicine

## 2013-01-05 ENCOUNTER — Ambulatory Visit (INDEPENDENT_AMBULATORY_CARE_PROVIDER_SITE_OTHER): Payer: 59 | Admitting: Family Medicine

## 2013-01-05 VITALS — BP 143/82 | HR 112 | Temp 97.3°F | Ht 64.0 in | Wt 236.0 lb

## 2013-01-05 DIAGNOSIS — K625 Hemorrhage of anus and rectum: Secondary | ICD-10-CM

## 2013-01-05 DIAGNOSIS — Z0000 Encounter for general adult medical examination without abnormal findings: Secondary | ICD-10-CM

## 2013-01-05 DIAGNOSIS — E538 Deficiency of other specified B group vitamins: Secondary | ICD-10-CM

## 2013-01-05 LAB — CBC
Hemoglobin: 11.6 g/dL — ABNORMAL LOW (ref 12.0–15.0)
MCH: 26.9 pg (ref 26.0–34.0)
MCV: 87.2 fL (ref 78.0–100.0)
RBC: 4.31 MIL/uL (ref 3.87–5.11)

## 2013-01-05 LAB — VITAMIN B12: Vitamin B-12: 592 pg/mL (ref 211–911)

## 2013-01-05 MED ORDER — VITAMIN B-12 1000 MCG PO TABS: 1000.0000 ug | ORAL_TABLET | Freq: Every day | ORAL | Status: AC

## 2013-01-05 NOTE — Progress Notes (Signed)
  Subjective:    Patient ID: Laura Griffith, female    DOB: Sep 09, 1933, 79 y.o.   MRN: [id number]  HPI # Follow-up of vitamin B-12 deficiency She had been on vitamin B-12 tablets for years before being switched to injectables ROS: denies weakness  # Preventative She has not thought about her code status.  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   Review of Systems Endorses good appetite Denies depression, falls Denies rectal bleeding or blood in stool  Allergies, medication, past medical history reviewed.  Smoking status noted.  Lives alone. She has no problems grooming, bathing herself, getting around the house. She likes to read and reads the newspaper daily.  Children live within 1 mile of her & very supportive and involved in her care. Her children buy groceries and sort out her medications for her which she is able to take by herself.  No EtOH, no tobacco.   Widowed. Walks with walker all the time.       Objective:   Physical Exam GEN: NAD; elderly NEURO: appropriate to questions; alert and oriented to all questions; pleasant; moves all extremities without focal deficits; no clonus CV: irregularly irregular PULM: NI WOB; CTAB without w/r/r ABD: soft, NT, ND EXT: fleshy legs without pitting edema GAIT: slow mildly antalgic using rolling walker     Assessment & Plan:

## 2013-01-05 NOTE — Assessment & Plan Note (Signed)
She has been on B-12 injections for a while given by [facility] nurse.  -Check B-12, folate, CBC today -Change B-12 injections to oral  -Re-check in 3 months or sooner if she feels weak or presents with other neurological symptoms

## 2013-01-05 NOTE — Assessment & Plan Note (Signed)
This has resolved. She did not get colonoscopy. Based on her age and her resolution of symptoms, we will not pursue colonoscopy at this time.

## 2013-01-05 NOTE — Patient Instructions (Addendum)
If your lab results are normal, I will send you a letter with the results. If abnormal, someone at the clinic will get in touch with you.   START vitamin B-12 1000 mcg daily. This was sent over to your pharmacy.   Follow-up in 3 months. We will re-check your vitamin B-12 level at that time.   It was nice to see you today.

## 2013-01-05 NOTE — Assessment & Plan Note (Signed)
She is doing well currently. Her children are supportive and help care for her.  We discussed code status. She is currently full code at this time, although she and her children said they do not want life support prolonged. We discussed contemplating what she should do if she should decline. She does not want to go to nursing home.

## 2013-01-08 ENCOUNTER — Telehealth: Payer: Self-pay | Admitting: Family Medicine

## 2013-01-08 DIAGNOSIS — D649 Anemia, unspecified: Secondary | ICD-10-CM

## 2013-01-08 NOTE — Assessment & Plan Note (Signed)
Normocytic anemia with elevated RDW. Discussed low Hgb (11 versus normal in the past) with daughter Hilda Lias.  We discussed pursuing this aggressively (patient overdue for colonoscopy which she declines at this time due to age and no symptoms) versus monitoring.  We will monitor at this time and re-check CBC in about a month. Advised to follow-up sooner if lightheaded, bleeding.   Of note, patient does have hemorrhoids which tend to bleed occasionally.

## 2013-01-08 NOTE — Telephone Encounter (Signed)
Normocytic anemia with elevated RDW. Discussed low Hgb (11 versus normal in the past) with daughter Marie.  We discussed pursuing this aggressively (patient overdue for colonoscopy which she declines at this time due to age and no symptoms) versus monitoring.  We will monitor at this time and re-check CBC in about a month. Advised to follow-up sooner if lightheaded, bleeding.   Of note, patient does have hemorrhoids which tend to bleed occasionally.     

## 2013-01-13 ENCOUNTER — Encounter: Payer: 59 | Admitting: Thoracic Surgery (Cardiothoracic Vascular Surgery)

## 2013-01-15 ENCOUNTER — Telehealth: Payer: Self-pay | Admitting: *Deleted

## 2013-01-15 NOTE — Telephone Encounter (Signed)
[name] with [facility] calling to follow up on Laura Griffith B-12 injections. States this was suppose to be addressed at last office visit but she hasn't heard anything back from Dr. Madolyn Frieze.   I informed [name] that according to last office note patient was changed to oral B-12 tablets 1000 mcg daily.  Ileana Ladd

## 2013-01-16 NOTE — Telephone Encounter (Signed)
LVM. Patient transitioned from IM to oral B-12. Will not need HH for this.

## 2013-01-30 ENCOUNTER — Telehealth: Payer: Self-pay | Admitting: Family Medicine

## 2013-01-30 NOTE — Telephone Encounter (Signed)
Patient is needing a refill on Tramadol, and she wants to make sure the dose she is prescribed is right.  She usually takes 2 - 3 per day.  She uses [facility].  She doesn't have enough to last through the weekend.

## 2013-01-30 NOTE — Telephone Encounter (Signed)
Will forward to Dr. Oh Park   

## 2013-01-31 MED ORDER — TRAMADOL HCL 50 MG PO TABS: 100.0000 mg | ORAL_TABLET | Freq: Two times a day (BID) | ORAL | Status: DC | PRN

## 2013-02-09 ENCOUNTER — Ambulatory Visit (INDEPENDENT_AMBULATORY_CARE_PROVIDER_SITE_OTHER): Payer: 59 | Admitting: Pharmacist

## 2013-02-09 DIAGNOSIS — Z7901 Long term (current) use of anticoagulants: Secondary | ICD-10-CM

## 2013-02-09 DIAGNOSIS — I4891 Unspecified atrial fibrillation: Secondary | ICD-10-CM

## 2013-02-16 ENCOUNTER — Ambulatory Visit
Admission: RE | Admit: 2013-02-16 | Discharge: 2013-02-16 | Disposition: A | Discharge status: Home / Self Care | Payer: 59 | Source: Ambulatory Visit | Attending: Cardiology | Admitting: Cardiology

## 2013-02-16 DIAGNOSIS — I712 Thoracic aortic aneurysm, without rupture: Secondary | ICD-10-CM

## 2013-02-16 MED ORDER — GADOBENATE DIMEGLUMINE 529 MG/ML IV SOLN
10.0000 mL | Freq: Once | INTRAVENOUS | Status: AC | PRN
  Administered 2013-02-16: 10 mL via INTRAVENOUS

## 2013-02-17 ENCOUNTER — Ambulatory Visit (INDEPENDENT_AMBULATORY_CARE_PROVIDER_SITE_OTHER): Payer: 59 | Admitting: Thoracic Surgery (Cardiothoracic Vascular Surgery)

## 2013-02-17 ENCOUNTER — Ambulatory Visit: Payer: 59 | Admitting: Thoracic Surgery (Cardiothoracic Vascular Surgery)

## 2013-02-17 ENCOUNTER — Encounter: Payer: Self-pay | Admitting: Thoracic Surgery (Cardiothoracic Vascular Surgery)

## 2013-02-17 VITALS — Ht 64.0 in | Wt 236.0 lb

## 2013-02-17 VITALS — BP 131/77 | HR 85 | Resp 18 | Ht 64.0 in | Wt 238.0 lb

## 2013-02-17 DIAGNOSIS — I712 Thoracic aortic aneurysm, without rupture: Secondary | ICD-10-CM

## 2013-02-17 NOTE — Progress Notes (Signed)
HPI:  Laura Griffith returns today for a one-year followup of her ascending aortic aneurysm. She says she's been doing well over the past year with no major changes in her health status. She still lives alone. She does occasionally have some chest pain, but she's been having a prolonged time has not had anything out of the ordinary.  Past Medical History  Diagnosis Date  . Obesity   . Depression   . HTN (hypertension)   . Hyperlipemia   . Renal insufficiency     baseline 1.6-1.8 - Cr 0.7 (`00) 1.6  8/02  1.5 (2/03), Cr. 0.9  8/03 1.1 (11/05), Creatinine Clearance 8/02 about 50 ml/min MSE:  30/30 (09/2006)  . Aortic aneurysm     Followed by Dr. Dorris Fetch.  CT chest (3/11) with stable 4.8 cm ascending  aortic aneurysm.  Marland Kitchen Atrial fibrillation     Noted initially at adenosine myoview 8/10.  She seems to be in chronic atrial fibrillation  at this point.  . Osteoarthritis   . Embolism - blood clot     insertion of vena cava filter      Current Outpatient Prescriptions  Medication Sig Dispense Refill  . acetaminophen (TYLENOL) 650 MG CR tablet Take 1 tablet (650 mg total) by mouth as needed.  30 tablet  6  . Calcium Carbonate-Vitamin D (CALCIUM-VITAMIN D) 500-200 MG-UNIT per tablet Take 1 tablet by mouth 2 (two) times daily with a meal.  60 tablet  6  . CRESTOR 10 MG tablet TAKE ONE (1) TABLET EACH DAY  30 each  6  . fish oil-omega-3 fatty acids 1000 MG capsule Take 2 capsules (2 g total) by mouth daily.  60 capsule  6  . hydrochlorothiazide (HYDRODIURIL) 25 MG tablet TAKE ONE TABLET DAILY  30 tablet  3  . LIDODERM 5 % APPLY 1 PATCH TO AFFECTED AREA FOR UP TO12 HOURS IN A 24 HOUR PERIOD AS NEEDED  FOR PAIN.  30 each  0  . losartan (COZAAR) 100 MG tablet TAKE ONE (1) TABLET EACH DAY  30 tablet  3  . metoprolol succinate (TOPROL-XL) 50 MG 24 hr tablet Take 1.5 tablets with or immediately following a meal.  45 tablet  5  . oxybutynin (DITROPAN-XL) 5 MG 24 hr tablet TAKE ONE (1) TABLET EACH DAY   30 tablet  3  . sertraline (ZOLOFT) 100 MG tablet TAKE ONE TABLET DAILY  30 tablet  3  . traMADol (ULTRAM) 50 MG tablet Take 2 tablets (100 mg total) by mouth 2 (two) times daily as needed for pain.  60 tablet  3  . vitamin B-12 (CYANOCOBALAMIN) 1000 MCG tablet Take 1 tablet (1,000 mcg total) by mouth daily.  90 tablet  0  . warfarin (COUMADIN) 1 MG tablet TAKE AS DIRECTED BY ANTICOAGULATION     CLINIC. PATIENT TAKES UP TO 2TABLETS   DAILY  60 tablet  3  . [DISCONTINUED] oxybutynin (DITROPAN-XL) 5 MG 24 hr tablet Take 1 tablet (5 mg total) by mouth daily.  30 tablet  6   No current facility-administered medications for this visit.    Physical Exam BP 131/77  Pulse 85  Resp 18  Ht 5\' 4"  (1.626 m)  Wt 238 lb (107.956 kg)  BMI 40.83 kg/m2  SpO2 95% Morbidly obese 79 year old woman in no acute distress Neurologic alert and oriented x3 Cardiac regular rate and rhythm, no murmur Lungs diminished breath sounds bilaterally  Diagnostic Tests: MRA  02/16/2013 MRA CHEST WITH OR WITHOUT CONTRAST  Technique: Angiographic images of the chest were obtained using MRA  technique without and with intravenous contrast.  Contrast: 10mL MULTIHANCE GADOBENATE DIMEGLUMINE 529 MG/ML IV SOLN  , decreased dose selected by Dr. Bradly Chris because of renal  insufficiency. BUN and creatinine were obtained on site at  [facility] at [address]  Results: BUN 48 mg/dL, Creatinine 1.5 mg/dL.  Comparison: 02/13/2012 and earlier studies  Findings: Dilated ascending aorta, measuring 4 cm maximum diameter  at the sinotubular junction, 4.3 cm proximal ascending, 4.7 cm mid  ascending ( previously 4.9), 4.3 cm distal ascending, 3.8 cm  proximal arch, 2.6 cm distal arch, 3.5 cm proximal descending, 2.8  cm distal descending above the diaphragm. No dissection. Bovine  brachiocephalic arterial origin anatomy without proximal stenosis.  No significant atheromatous irregularity. There is some unfolding  of  the thoracic aorta as before. Visualized proximal abdominal  aorta is normal in caliber, unremarkable.  Stable four-chamber cardiac enlargement. Central pulmonary  arterial circulation unremarkable. No pleural or pericardial  effusion. No mediastinal hematoma. Fluid signal lesions in the  spleen as noted on earlier exams, incompletely characterized.  IMPRESSION:  1. Stable 4.7 cm ascending aortic aneurysm without complicating  features.  Original Report Authenticated By: D. Andria Rhein, MD  Impression: 79 year old woman with a 4.7-4.9 cm ascending aortic aneurysm. On MR angiogram yesterday, it actually measured smaller than it did a year ago although it is within the margin of error.  There is no indication for surgery at this time. She would not be a candidate for emergent repair in the event of a dissection or rupture.  No medication changes made today.  Plan:  Return in 1 year with a MR angio of the chest

## 2013-02-18 NOTE — Progress Notes (Signed)
Patient ID: Laura Griffith, female   DOB: [birth year], 79 y.o.   MRN: [id number] HPI:  Laura Griffith returns today for a one-year followup of her ascending aortic aneurysm. She says she's been doing well over the past year with no major changes in her health status. She still lives alone. She does occasionally have some chest pain, but she's been having a prolonged time has not had anything out of the ordinary.  Past Medical History   Diagnosis  Date   .  Obesity    .  Depression    .  HTN (hypertension)    .  Hyperlipemia    .  Renal insufficiency      baseline 1.6-1.8 - Cr 0.7 (`00) 1.6 8/02 1.5 (2/03), Cr. 0.9 8/03 1.1 (11/05), Creatinine Clearance 8/02 about 50 ml/min MSE: 30/30 (09/2006)   .  Aortic aneurysm      Followed by Dr. Dorris Fetch. CT chest (3/11) with stable 4.8 cm ascending aortic aneurysm.   Marland Kitchen  Atrial fibrillation      Noted initially at adenosine myoview 8/10. She seems to be in chronic atrial fibrillation at this point.   .  Osteoarthritis    .  Embolism - blood clot      insertion of vena cava filter    Current Outpatient Prescriptions   Medication  Sig  Dispense  Refill   .  acetaminophen (TYLENOL) 650 MG CR tablet  Take 1 tablet (650 mg total) by mouth as needed.  30 tablet  6   .  Calcium Carbonate-Vitamin D (CALCIUM-VITAMIN D) 500-200 MG-UNIT per tablet  Take 1 tablet by mouth 2 (two) times daily with a meal.  60 tablet  6   .  CRESTOR 10 MG tablet  TAKE ONE (1) TABLET EACH DAY  30 each  6   .  fish oil-omega-3 fatty acids 1000 MG capsule  Take 2 capsules (2 g total) by mouth daily.  60 capsule  6   .  hydrochlorothiazide (HYDRODIURIL) 25 MG tablet  TAKE ONE TABLET DAILY  30 tablet  3   .  LIDODERM 5 %  APPLY 1 PATCH TO AFFECTED AREA FOR UP TO12 HOURS IN A 24 HOUR PERIOD AS NEEDED FOR PAIN.  30 each  0   .  losartan (COZAAR) 100 MG tablet  TAKE ONE (1) TABLET EACH DAY  30 tablet  3   .  metoprolol succinate (TOPROL-XL) 50 MG 24 hr tablet  Take 1.5 tablets with or  immediately following a meal.  45 tablet  5   .  oxybutynin (DITROPAN-XL) 5 MG 24 hr tablet  TAKE ONE (1) TABLET EACH DAY  30 tablet  3   .  sertraline (ZOLOFT) 100 MG tablet  TAKE ONE TABLET DAILY  30 tablet  3   .  traMADol (ULTRAM) 50 MG tablet  Take 2 tablets (100 mg total) by mouth 2 (two) times daily as needed for pain.  60 tablet  3   .  vitamin B-12 (CYANOCOBALAMIN) 1000 MCG tablet  Take 1 tablet (1,000 mcg total) by mouth daily.  90 tablet  0   .  warfarin (COUMADIN) 1 MG tablet  TAKE AS DIRECTED BY ANTICOAGULATION CLINIC. PATIENT TAKES UP TO 2TABLETS DAILY  60 tablet  3   .  [DISCONTINUED] oxybutynin (DITROPAN-XL) 5 MG 24 hr tablet  Take 1 tablet (5 mg total) by mouth daily.  30 tablet  6    No current facility-administered medications for this  visit.   Physical Exam  BP 131/77  Pulse 85  Resp 18  Ht 5\' 4"  (1.626 m)  Wt 238 lb (107.956 kg)  BMI 40.83 kg/m2  SpO2 95%  Morbidly obese 79 year old woman in no acute distress  Neurologic alert and oriented x3  Cardiac regular rate and rhythm, no murmur  Lungs diminished breath sounds bilaterally  Diagnostic Tests:  MRA 02/16/2013  MRA CHEST WITH OR WITHOUT CONTRAST  Technique: Angiographic images of the chest were obtained using MRA  technique without and with intravenous contrast.  Contrast: 10mL MULTIHANCE GADOBENATE DIMEGLUMINE 529 MG/ML IV SOLN  , decreased dose selected by Dr. Bradly Chris because of renal  insufficiency. BUN and creatinine were obtained on site at  [facility] at [address]  Results: BUN 48 mg/dL, Creatinine 1.5 mg/dL.  Comparison: 02/13/2012 and earlier studies  Findings: Dilated ascending aorta, measuring 4 cm maximum diameter  at the sinotubular junction, 4.3 cm proximal ascending, 4.7 cm mid  ascending ( previously 4.9), 4.3 cm distal ascending, 3.8 cm  proximal arch, 2.6 cm distal arch, 3.5 cm proximal descending, 2.8  cm distal descending above the diaphragm. No dissection. Bovine   brachiocephalic arterial origin anatomy without proximal stenosis.  No significant atheromatous irregularity. There is some unfolding  of the thoracic aorta as before. Visualized proximal abdominal  aorta is normal in caliber, unremarkable.  Stable four-chamber cardiac enlargement. Central pulmonary  arterial circulation unremarkable. No pleural or pericardial  effusion. No mediastinal hematoma. Fluid signal lesions in the  spleen as noted on earlier exams, incompletely characterized.  IMPRESSION:  1. Stable 4.7 cm ascending aortic aneurysm without complicating  features.  Original Report Authenticated By: D. Andria Rhein, MD  Impression:  79 year old woman with a 4.7-4.9 cm ascending aortic aneurysm. On MR angiogram yesterday, it actually measured smaller than it did a year ago although it is within the margin of error.  There is no indication for surgery at this time. She would not be a candidate for emergent repair in the event of a dissection or rupture.  No medication changes made today.  Plan:  Return in 1 year with a MR angio of the chest

## 2013-02-24 ENCOUNTER — Encounter: Payer: 59 | Admitting: Thoracic Surgery (Cardiothoracic Vascular Surgery)

## 2013-03-21 ENCOUNTER — Other Ambulatory Visit: Payer: Self-pay | Admitting: Cardiology

## 2013-03-23 ENCOUNTER — Ambulatory Visit (INDEPENDENT_AMBULATORY_CARE_PROVIDER_SITE_OTHER): Payer: 59 | Admitting: Pharmacist

## 2013-03-23 DIAGNOSIS — Z7901 Long term (current) use of anticoagulants: Secondary | ICD-10-CM

## 2013-03-23 DIAGNOSIS — I4891 Unspecified atrial fibrillation: Secondary | ICD-10-CM

## 2013-03-23 LAB — POCT INR: INR: 2.7

## 2013-04-20 ENCOUNTER — Other Ambulatory Visit: Payer: Self-pay | Admitting: *Deleted

## 2013-04-20 MED ORDER — OXYBUTYNIN CHLORIDE ER 5 MG PO TB24: 5.0000 mg | ORAL_TABLET | Freq: Every day | ORAL | Status: DC

## 2013-04-20 MED ORDER — SERTRALINE HCL 100 MG PO TABS: 100.0000 mg | ORAL_TABLET | Freq: Every day | ORAL | Status: DC

## 2013-04-20 MED ORDER — HYDROCHLOROTHIAZIDE 25 MG PO TABS: 25.0000 mg | ORAL_TABLET | Freq: Every day | ORAL | Status: DC

## 2013-04-20 MED ORDER — LOSARTAN POTASSIUM 100 MG PO TABS: 100.0000 mg | ORAL_TABLET | Freq: Every day | ORAL | Status: DC

## 2013-04-21 ENCOUNTER — Other Ambulatory Visit: Payer: Self-pay | Admitting: *Deleted

## 2013-04-21 MED ORDER — METOPROLOL SUCCINATE ER 50 MG PO TB24: ORAL_TABLET | ORAL | Status: DC

## 2013-04-21 MED ORDER — HYDROCHLOROTHIAZIDE 25 MG PO TABS: 25.0000 mg | ORAL_TABLET | Freq: Every day | ORAL | Status: DC

## 2013-04-21 NOTE — Telephone Encounter (Signed)
Requested Prescriptions   Pending Prescriptions Disp Refills  . hydrochlorothiazide (HYDRODIURIL) 25 MG tablet 30 tablet 1    Sig: Take 1 tablet (25 mg total) by mouth daily.   Wyatt Haste, RN-BSN

## 2013-05-04 ENCOUNTER — Ambulatory Visit (INDEPENDENT_AMBULATORY_CARE_PROVIDER_SITE_OTHER): Payer: 59

## 2013-05-04 DIAGNOSIS — Z7901 Long term (current) use of anticoagulants: Secondary | ICD-10-CM

## 2013-05-04 DIAGNOSIS — I4891 Unspecified atrial fibrillation: Secondary | ICD-10-CM

## 2013-06-02 ENCOUNTER — Other Ambulatory Visit: Payer: Self-pay | Admitting: Family Medicine

## 2013-06-15 ENCOUNTER — Ambulatory Visit (INDEPENDENT_AMBULATORY_CARE_PROVIDER_SITE_OTHER): Payer: Medicare HMO | Admitting: *Deleted

## 2013-06-15 DIAGNOSIS — I4891 Unspecified atrial fibrillation: Secondary | ICD-10-CM

## 2013-06-15 DIAGNOSIS — Z7901 Long term (current) use of anticoagulants: Secondary | ICD-10-CM

## 2013-06-20 ENCOUNTER — Other Ambulatory Visit: Payer: Self-pay | Admitting: Cardiology

## 2013-06-23 ENCOUNTER — Other Ambulatory Visit: Payer: Self-pay | Admitting: Family Medicine

## 2013-06-23 ENCOUNTER — Other Ambulatory Visit: Payer: Self-pay | Admitting: *Deleted

## 2013-06-23 MED ORDER — METOPROLOL SUCCINATE ER 50 MG PO TB24: ORAL_TABLET | ORAL | Status: DC

## 2013-06-23 NOTE — Telephone Encounter (Signed)
Had duplicate losartan refill request

## 2013-06-24 ENCOUNTER — Ambulatory Visit (INDEPENDENT_AMBULATORY_CARE_PROVIDER_SITE_OTHER): Payer: Medicare HMO | Admitting: *Deleted

## 2013-06-24 DIAGNOSIS — I4891 Unspecified atrial fibrillation: Secondary | ICD-10-CM

## 2013-06-24 DIAGNOSIS — Z7901 Long term (current) use of anticoagulants: Secondary | ICD-10-CM

## 2013-06-24 LAB — POCT INR: INR: 2.1

## 2013-07-13 ENCOUNTER — Ambulatory Visit (INDEPENDENT_AMBULATORY_CARE_PROVIDER_SITE_OTHER): Payer: Medicare HMO | Admitting: *Deleted

## 2013-07-13 DIAGNOSIS — I4891 Unspecified atrial fibrillation: Secondary | ICD-10-CM

## 2013-07-13 DIAGNOSIS — Z7901 Long term (current) use of anticoagulants: Secondary | ICD-10-CM

## 2013-07-16 ENCOUNTER — Other Ambulatory Visit: Payer: Self-pay | Admitting: Cardiology

## 2013-07-26 ENCOUNTER — Other Ambulatory Visit: Payer: Self-pay | Admitting: Cardiology

## 2013-07-27 ENCOUNTER — Ambulatory Visit (INDEPENDENT_AMBULATORY_CARE_PROVIDER_SITE_OTHER): Payer: Medicare HMO | Admitting: *Deleted

## 2013-07-27 DIAGNOSIS — Z7901 Long term (current) use of anticoagulants: Secondary | ICD-10-CM

## 2013-07-27 DIAGNOSIS — I4891 Unspecified atrial fibrillation: Secondary | ICD-10-CM

## 2013-08-10 ENCOUNTER — Ambulatory Visit (INDEPENDENT_AMBULATORY_CARE_PROVIDER_SITE_OTHER): Payer: Medicare HMO | Admitting: *Deleted

## 2013-08-10 DIAGNOSIS — I4891 Unspecified atrial fibrillation: Secondary | ICD-10-CM

## 2013-08-10 DIAGNOSIS — Z7901 Long term (current) use of anticoagulants: Secondary | ICD-10-CM

## 2013-08-17 ENCOUNTER — Ambulatory Visit (INDEPENDENT_AMBULATORY_CARE_PROVIDER_SITE_OTHER): Payer: Medicare HMO | Admitting: *Deleted

## 2013-08-17 DIAGNOSIS — I4891 Unspecified atrial fibrillation: Secondary | ICD-10-CM

## 2013-08-17 DIAGNOSIS — Z7901 Long term (current) use of anticoagulants: Secondary | ICD-10-CM

## 2013-08-17 LAB — POCT INR: INR: 1.9

## 2013-08-25 ENCOUNTER — Other Ambulatory Visit: Payer: Self-pay | Admitting: Family Medicine

## 2013-08-26 ENCOUNTER — Ambulatory Visit (INDEPENDENT_AMBULATORY_CARE_PROVIDER_SITE_OTHER): Payer: Medicare HMO | Admitting: *Deleted

## 2013-08-26 DIAGNOSIS — I4891 Unspecified atrial fibrillation: Secondary | ICD-10-CM

## 2013-08-26 DIAGNOSIS — Z7901 Long term (current) use of anticoagulants: Secondary | ICD-10-CM

## 2013-09-02 ENCOUNTER — Ambulatory Visit (INDEPENDENT_AMBULATORY_CARE_PROVIDER_SITE_OTHER): Payer: Medicare HMO | Admitting: Family Medicine

## 2013-09-02 ENCOUNTER — Encounter: Payer: Self-pay | Admitting: Family Medicine

## 2013-09-02 VITALS — BP 112/84 | HR 114 | Temp 97.7°F | Ht 64.0 in | Wt 245.1 lb

## 2013-09-02 DIAGNOSIS — N390 Urinary tract infection, site not specified: Secondary | ICD-10-CM | POA: Insufficient documentation

## 2013-09-02 DIAGNOSIS — R300 Dysuria: Secondary | ICD-10-CM

## 2013-09-02 LAB — POCT URINALYSIS DIPSTICK
Glucose, UA: NEGATIVE
Nitrite, UA: POSITIVE
Spec Grav, UA: 1.02
Urobilinogen, UA: 0.2

## 2013-09-02 LAB — POCT UA - MICROSCOPIC ONLY

## 2013-09-02 MED ORDER — CEPHALEXIN 500 MG PO CAPS: 500.0000 mg | ORAL_CAPSULE | Freq: Three times a day (TID) | ORAL | Status: DC

## 2013-09-02 NOTE — Patient Instructions (Signed)
Urinary Tract Infection  Urinary tract infections (UTIs) can develop anywhere along your urinary tract. Your urinary tract is your body's drainage system for removing wastes and extra water. Your urinary tract includes two kidneys, two ureters, a bladder, and a urethra. Your kidneys are a pair of bean-shaped organs. Each kidney is about the size of your fist. They are located below your ribs, one on each side of your spine.  CAUSES  Infections are caused by microbes, which are microscopic organisms, including fungi, viruses, and bacteria. These organisms are so small that they can only be seen through a microscope. Bacteria are the microbes that most commonly cause UTIs.  SYMPTOMS   Symptoms of UTIs may vary by age and gender of the patient and by the location of the infection. Symptoms in young women typically include a frequent and intense urge to urinate and a painful, burning feeling in the bladder or urethra during urination. Older women and men are more likely to be tired, shaky, and weak and have muscle aches and abdominal pain. A fever may mean the infection is in your kidneys. Other symptoms of a kidney infection include pain in your back or sides below the ribs, nausea, and vomiting.  DIAGNOSIS  To diagnose a UTI, your caregiver will ask you about your symptoms. Your caregiver also will ask to provide a urine sample. The urine sample will be tested for bacteria and white blood cells. White blood cells are made by your body to help fight infection.  TREATMENT   Typically, UTIs can be treated with medication. Because most UTIs are caused by a bacterial infection, they usually can be treated with the use of antibiotics. The choice of antibiotic and length of treatment depend on your symptoms and the type of bacteria causing your infection.  HOME CARE INSTRUCTIONS   If you were prescribed antibiotics, take them exactly as your caregiver instructs you. Finish the medication even if you feel better after you  have only taken some of the medication.   Drink enough water and fluids to keep your urine clear or pale yellow.   Avoid caffeine, tea, and carbonated beverages. They tend to irritate your bladder.   Empty your bladder often. Avoid holding urine for long periods of time.   Empty your bladder before and after sexual intercourse.   After a bowel movement, women should cleanse from front to back. Use each tissue only once.  SEEK MEDICAL CARE IF:    You have back pain.   You develop a fever.   Your symptoms do not begin to resolve within 3 days.  SEEK IMMEDIATE MEDICAL CARE IF:    You have severe back pain or lower abdominal pain.   You develop chills.   You have nausea or vomiting.   You have continued burning or discomfort with urination.  MAKE SURE YOU:    Understand these instructions.   Will watch your condition.   Will get help right away if you are not doing well or get worse.  Document Released: 08/15/2005 Document Revised: 05/06/2012 Document Reviewed: 12/14/2011  [employer] Patient Information 2014 [employer]

## 2013-09-02 NOTE — Progress Notes (Signed)
Family Medicine Office Visit Note   Subjective:   Patient ID: Laura Griffith, female  DOB: 1933/07/20, 79 y.o.. MRN: [id number]   Pt that comes today for same day appointment complaining of urinary symptoms for about a month. She reports dysuria and frequency but denies fever, chills, nausea, vomiting or flank pain. Patient is incontinent able to change her diapers herself.   Review of Systems:  Per HPI  Objective:   Physical Exam: Gen:  Morbidly obese, pleasant and cooperative with exam. NAD HEENT: Moist mucous membranes  CV: Regular rate and rhythm, no murmurs.  PULM: Clear to auscultation bilaterally.  ABD: Soft, non tender, non distended, normal bowel sounds. No CVA tenderness.  EXT: No edema Neuro: Alert and oriented x3. No focalization   Assessment & Plan:

## 2013-09-02 NOTE — Addendum Note (Signed)
Addended by: Jennette Bill on: 09/02/2013 10:08 AM   Modules accepted: Orders

## 2013-09-02 NOTE — Assessment & Plan Note (Signed)
No signs of Pyelonephritis, pt is incontinent but has not have any instrumentation. UA positive for nitrates, leukocytes and RBCs. Urine sent for culture. Start Keflex and will adjust based on cultures. Discussed signs of worsening condition that should prompt re-evaluation. Follow up as needed.

## 2013-09-05 LAB — URINE CULTURE: Colony Count: >=100000

## 2013-09-12 ENCOUNTER — Other Ambulatory Visit: Payer: Self-pay | Admitting: Cardiology

## 2013-09-15 ENCOUNTER — Ambulatory Visit (INDEPENDENT_AMBULATORY_CARE_PROVIDER_SITE_OTHER): Payer: Medicare HMO | Admitting: *Deleted

## 2013-09-15 DIAGNOSIS — Z23 Encounter for immunization: Secondary | ICD-10-CM

## 2013-09-15 DIAGNOSIS — I4891 Unspecified atrial fibrillation: Secondary | ICD-10-CM

## 2013-09-15 DIAGNOSIS — Z7901 Long term (current) use of anticoagulants: Secondary | ICD-10-CM

## 2013-09-25 ENCOUNTER — Telehealth: Payer: Self-pay | Admitting: Family Medicine

## 2013-09-25 DIAGNOSIS — N390 Urinary tract infection, site not specified: Secondary | ICD-10-CM

## 2013-09-25 MED ORDER — CEPHALEXIN 500 MG PO CAPS: 500.0000 mg | ORAL_CAPSULE | Freq: Three times a day (TID) | ORAL | Status: DC

## 2013-09-25 NOTE — Telephone Encounter (Signed)
I called an spoke with daughter Hilda Lias. Patient still having strong odor with urine, complaining of some continued dysuria. Called in additional prescription for 10 days keflex (urine culture grew e. Coli pansensitive). Daughter agrees and will continue to monitor for signs of worsening infection, if this prescription of antibiotics does not work she will schedule another appointment. Greig Castilla

## 2013-09-25 NOTE — Telephone Encounter (Signed)
Son called because his mother still has the uti. It is not as bad as is was. But since she had it for a year or so he feels that she could use something stronger or more of the medication she got on her last visit. JW

## 2013-09-28 ENCOUNTER — Inpatient Hospital Stay (HOSPITAL_COMMUNITY): Payer: Medicare HMO

## 2013-09-28 ENCOUNTER — Encounter (HOSPITAL_COMMUNITY): Payer: Self-pay | Admitting: Emergency Medicine

## 2013-09-28 ENCOUNTER — Inpatient Hospital Stay (HOSPITAL_COMMUNITY)
Admission: EM | Admit: 2013-09-28 | Discharge: 2013-10-06 | DRG: 480 | Disposition: A | Discharge status: Skilled Nursing Facility | Payer: Medicare HMO | Attending: Family Medicine | Admitting: Family Medicine

## 2013-09-28 ENCOUNTER — Emergency Department (HOSPITAL_COMMUNITY): Payer: Medicare HMO

## 2013-09-28 DIAGNOSIS — S7292XA Unspecified fracture of left femur, initial encounter for closed fracture: Secondary | ICD-10-CM

## 2013-09-28 DIAGNOSIS — F29 Unspecified psychosis not due to a substance or known physiological condition: Secondary | ICD-10-CM | POA: Diagnosis present

## 2013-09-28 DIAGNOSIS — F329 Major depressive disorder, single episode, unspecified: Secondary | ICD-10-CM | POA: Diagnosis present

## 2013-09-28 DIAGNOSIS — D649 Anemia, unspecified: Secondary | ICD-10-CM

## 2013-09-28 DIAGNOSIS — E662 Morbid (severe) obesity with alveolar hypoventilation: Secondary | ICD-10-CM | POA: Diagnosis present

## 2013-09-28 DIAGNOSIS — E785 Hyperlipidemia, unspecified: Secondary | ICD-10-CM | POA: Diagnosis present

## 2013-09-28 DIAGNOSIS — M1710 Unilateral primary osteoarthritis, unspecified knee: Secondary | ICD-10-CM | POA: Diagnosis present

## 2013-09-28 DIAGNOSIS — F05 Delirium due to known physiological condition: Secondary | ICD-10-CM | POA: Diagnosis not present

## 2013-09-28 DIAGNOSIS — Z85828 Personal history of other malignant neoplasm of skin: Secondary | ICD-10-CM

## 2013-09-28 DIAGNOSIS — E8770 Fluid overload, unspecified: Secondary | ICD-10-CM

## 2013-09-28 DIAGNOSIS — I5033 Acute on chronic diastolic (congestive) heart failure: Secondary | ICD-10-CM | POA: Diagnosis not present

## 2013-09-28 DIAGNOSIS — Z86718 Personal history of other venous thrombosis and embolism: Secondary | ICD-10-CM

## 2013-09-28 DIAGNOSIS — J810 Acute pulmonary edema: Secondary | ICD-10-CM

## 2013-09-28 DIAGNOSIS — I2789 Other specified pulmonary heart diseases: Secondary | ICD-10-CM | POA: Diagnosis present

## 2013-09-28 DIAGNOSIS — E669 Obesity, unspecified: Secondary | ICD-10-CM

## 2013-09-28 DIAGNOSIS — F3289 Other specified depressive episodes: Secondary | ICD-10-CM | POA: Diagnosis present

## 2013-09-28 DIAGNOSIS — D62 Acute posthemorrhagic anemia: Secondary | ICD-10-CM | POA: Diagnosis not present

## 2013-09-28 DIAGNOSIS — I4891 Unspecified atrial fibrillation: Secondary | ICD-10-CM

## 2013-09-28 DIAGNOSIS — M1990 Unspecified osteoarthritis, unspecified site: Secondary | ICD-10-CM

## 2013-09-28 DIAGNOSIS — Z7901 Long term (current) use of anticoagulants: Secondary | ICD-10-CM

## 2013-09-28 DIAGNOSIS — Z96659 Presence of unspecified artificial knee joint: Secondary | ICD-10-CM

## 2013-09-28 DIAGNOSIS — I712 Thoracic aortic aneurysm, without rupture, unspecified: Secondary | ICD-10-CM | POA: Diagnosis present

## 2013-09-28 DIAGNOSIS — I5032 Chronic diastolic (congestive) heart failure: Secondary | ICD-10-CM

## 2013-09-28 DIAGNOSIS — E876 Hypokalemia: Secondary | ICD-10-CM | POA: Diagnosis present

## 2013-09-28 DIAGNOSIS — Y92009 Unspecified place in unspecified non-institutional (private) residence as the place of occurrence of the external cause: Secondary | ICD-10-CM

## 2013-09-28 DIAGNOSIS — Z6841 Body Mass Index (BMI) 40.0 and over, adult: Secondary | ICD-10-CM | POA: Diagnosis present

## 2013-09-28 DIAGNOSIS — S72453A Displaced supracondylar fracture without intracondylar extension of lower end of unspecified femur, initial encounter for closed fracture: Principal | ICD-10-CM | POA: Diagnosis present

## 2013-09-28 DIAGNOSIS — R062 Wheezing: Secondary | ICD-10-CM | POA: Diagnosis present

## 2013-09-28 DIAGNOSIS — N19 Unspecified kidney failure: Secondary | ICD-10-CM | POA: Diagnosis present

## 2013-09-28 DIAGNOSIS — Z79899 Other long term (current) drug therapy: Secondary | ICD-10-CM

## 2013-09-28 DIAGNOSIS — B9689 Other specified bacterial agents as the cause of diseases classified elsewhere: Secondary | ICD-10-CM | POA: Diagnosis present

## 2013-09-28 DIAGNOSIS — G4733 Obstructive sleep apnea (adult) (pediatric): Secondary | ICD-10-CM | POA: Diagnosis not present

## 2013-09-28 DIAGNOSIS — I509 Heart failure, unspecified: Secondary | ICD-10-CM | POA: Diagnosis present

## 2013-09-28 DIAGNOSIS — I959 Hypotension, unspecified: Secondary | ICD-10-CM | POA: Diagnosis not present

## 2013-09-28 DIAGNOSIS — S72402A Unspecified fracture of lower end of left femur, initial encounter for closed fracture: Secondary | ICD-10-CM

## 2013-09-28 DIAGNOSIS — N183 Chronic kidney disease, stage 3 unspecified: Secondary | ICD-10-CM | POA: Diagnosis present

## 2013-09-28 DIAGNOSIS — Z8249 Family history of ischemic heart disease and other diseases of the circulatory system: Secondary | ICD-10-CM

## 2013-09-28 DIAGNOSIS — W19XXXA Unspecified fall, initial encounter: Secondary | ICD-10-CM | POA: Diagnosis present

## 2013-09-28 DIAGNOSIS — I129 Hypertensive chronic kidney disease with stage 1 through stage 4 chronic kidney disease, or unspecified chronic kidney disease: Secondary | ICD-10-CM | POA: Diagnosis present

## 2013-09-28 DIAGNOSIS — I10 Essential (primary) hypertension: Secondary | ICD-10-CM | POA: Diagnosis present

## 2013-09-28 DIAGNOSIS — Z803 Family history of malignant neoplasm of breast: Secondary | ICD-10-CM

## 2013-09-28 DIAGNOSIS — N390 Urinary tract infection, site not specified: Secondary | ICD-10-CM | POA: Diagnosis present

## 2013-09-28 DIAGNOSIS — J9620 Acute and chronic respiratory failure, unspecified whether with hypoxia or hypercapnia: Secondary | ICD-10-CM | POA: Diagnosis not present

## 2013-09-28 HISTORY — DX: Acute embolism and thrombosis of unspecified deep veins of unspecified lower extremity: I82.409

## 2013-09-28 HISTORY — DX: Chronic kidney disease, stage 3 (moderate): N18.3

## 2013-09-28 HISTORY — DX: Chronic kidney disease, stage 3 unspecified: N18.30

## 2013-09-28 HISTORY — DX: Personal history of other medical treatment: Z92.89

## 2013-09-28 HISTORY — DX: Personal history of pulmonary embolism: Z86.711

## 2013-09-28 HISTORY — DX: Chronic diastolic (congestive) heart failure: I50.32

## 2013-09-28 HISTORY — DX: Unspecified malignant neoplasm of skin of unspecified part of face: C44.300

## 2013-09-28 HISTORY — DX: Unspecified fracture of left femur, initial encounter for closed fracture: S72.92XA

## 2013-09-28 LAB — CBC WITH DIFFERENTIAL/PLATELET
Eosinophils Relative: 0 % (ref 0–5)
HCT: 37.6 % (ref 36.0–46.0)
Hemoglobin: 11 g/dL — ABNORMAL LOW (ref 12.0–15.0)
Lymphocytes Relative: 21 % (ref 12–46)
Lymphs Abs: 1.9 10*3/uL (ref 0.7–4.0)
MCV: 88.3 fL (ref 78.0–100.0)
Monocytes Absolute: 0.9 10*3/uL (ref 0.1–1.0)
Monocytes Relative: 10 % (ref 3–12)
RBC: 4.26 MIL/uL (ref 3.87–5.11)
WBC: 9.1 10*3/uL (ref 4.0–10.5)

## 2013-09-28 LAB — BASIC METABOLIC PANEL
CO2: 29 mEq/L (ref 19–32)
Calcium: 10.1 mg/dL (ref 8.4–10.5)
Chloride: 108 mEq/L (ref 96–112)
Creatinine, Ser: 1.34 mg/dL — ABNORMAL HIGH (ref 0.50–1.10)
Glucose, Bld: 114 mg/dL — ABNORMAL HIGH (ref 70–99)

## 2013-09-28 LAB — ABO/RH: ABO/RH(D): A POS

## 2013-09-28 LAB — URINALYSIS, ROUTINE W REFLEX MICROSCOPIC
Glucose, UA: NEGATIVE mg/dL
Ketones, ur: NEGATIVE mg/dL
Protein, ur: NEGATIVE mg/dL
pH: 5.5 (ref 5.0–8.0)

## 2013-09-28 LAB — URINE MICROSCOPIC-ADD ON

## 2013-09-28 MED ORDER — SODIUM CHLORIDE 0.9 % IV BOLUS (SEPSIS)
250.0000 mL | Freq: Once | INTRAVENOUS | Status: AC
  Administered 2013-09-28: 250 mL via INTRAVENOUS

## 2013-09-28 MED ORDER — ACETAMINOPHEN 500 MG PO TABS
500.0000 mg | ORAL_TABLET | Freq: Four times a day (QID) | ORAL | Status: DC | PRN
  Filled 2013-09-28: qty 1

## 2013-09-28 MED ORDER — DEXTROSE 5 % IV SOLN
3.0000 g | INTRAVENOUS | Status: AC
  Administered 2013-09-29: 3 g via INTRAVENOUS
  Filled 2013-09-28: qty 3000

## 2013-09-28 MED ORDER — ATORVASTATIN CALCIUM 20 MG PO TABS
20.0000 mg | ORAL_TABLET | Freq: Every day | ORAL | Status: DC
  Administered 2013-09-30 – 2013-10-05 (×6): 20 mg via ORAL
  Filled 2013-09-28 (×8): qty 1

## 2013-09-28 MED ORDER — ACETAMINOPHEN 500 MG PO TABS
1000.0000 mg | ORAL_TABLET | Freq: Once | ORAL | Status: AC
Start: 2013-09-28 — End: 2013-09-28
  Administered 2013-09-28: 1000 mg via ORAL
  Filled 2013-09-28: qty 2

## 2013-09-28 MED ORDER — DEXTROSE-NACL 5-0.45 % IV SOLN
100.0000 mL/h | INTRAVENOUS | Status: AC
  Administered 2013-09-28: 100 mL/h via INTRAVENOUS

## 2013-09-28 MED ORDER — CHLORHEXIDINE GLUCONATE 4 % EX LIQD
60.0000 mL | Freq: Once | CUTANEOUS | Status: AC
  Administered 2013-09-28: 4 via TOPICAL
  Filled 2013-09-28: qty 60

## 2013-09-28 MED ORDER — LEVALBUTEROL HCL 0.63 MG/3ML IN NEBU
0.6300 mg | INHALATION_SOLUTION | Freq: Three times a day (TID) | RESPIRATORY_TRACT | Status: DC | PRN
  Administered 2013-09-28 – 2013-10-01 (×2): 0.63 mg via RESPIRATORY_TRACT
  Filled 2013-09-28 (×2): qty 3

## 2013-09-28 MED ORDER — METOPROLOL TARTRATE 25 MG PO TABS
37.5000 mg | ORAL_TABLET | Freq: Two times a day (BID) | ORAL | Status: DC
  Administered 2013-09-28 – 2013-09-29 (×2): 37.5 mg via ORAL
  Filled 2013-09-28 (×7): qty 1

## 2013-09-28 MED ORDER — CEFTRIAXONE SODIUM 1 G IJ SOLR
1.0000 g | Freq: Once | INTRAMUSCULAR | Status: AC
  Administered 2013-09-28: 1 g via INTRAMUSCULAR
  Filled 2013-09-28: qty 10

## 2013-09-28 MED ORDER — MORPHINE SULFATE 4 MG/ML IJ SOLN: 4.0000 mg | INTRAMUSCULAR | Status: DC | PRN

## 2013-09-28 MED ORDER — FENTANYL CITRATE 0.05 MG/ML IJ SOLN
50.0000 ug | Freq: Once | INTRAMUSCULAR | Status: AC
  Administered 2013-09-28: 50 ug via INTRAVENOUS
  Filled 2013-09-28: qty 2

## 2013-09-28 MED ORDER — SERTRALINE HCL 100 MG PO TABS
100.0000 mg | ORAL_TABLET | Freq: Every day | ORAL | Status: DC
  Administered 2013-09-29 – 2013-10-06 (×8): 100 mg via ORAL
  Filled 2013-09-28 (×8): qty 1

## 2013-09-28 NOTE — ED Notes (Signed)
Pt returned from radiology.

## 2013-09-28 NOTE — Consult Note (Signed)
ORTHOPAEDIC CONSULTATION  REQUESTING PHYSICIAN: Ethelda Chick, MD  Chief Complaint: Left distal femur fracture.   HPI: Laura Griffith is a 79 y.o. female who complains of  Fall onto L leg  Past Medical History  Diagnosis Date  . Obesity   . Depression   . HTN (hypertension)   . Hyperlipemia   . Renal insufficiency     baseline 1.6-1.8 - Cr 0.7 (`00) 1.6  8/02  1.5 (2/03), Cr. 0.9  8/03 1.1 (11/05), Creatinine Clearance 8/02 about 50 ml/min MSE:  30/30 (09/2006)  . Aortic aneurysm     Followed by Dr. Dorris Fetch.  CT chest (3/11) with stable 4.8 cm ascending  aortic aneurysm.  Marland Kitchen Atrial fibrillation     Noted initially at adenosine myoview 8/10.  She seems to be in chronic atrial fibrillation  at this point.  . Osteoarthritis   . Embolism - blood clot     insertion of vena cava filter   Past Surgical History  Procedure Laterality Date  . Cataract extraction    . Cholecystectomy    . Hernia repair    . Hysterotomy    . Osteotomy     History   Social History  . Marital Status: Widowed    Spouse Name: N/A    Number of Children: N/A  . Years of Education: N/A   Social History Main Topics  . Smoking status: Never Smoker   . Smokeless tobacco: Never Used  . Alcohol Use: No  . Drug Use: No  . Sexual Activity: None   Other Topics Concern  . None   Social History Narrative   Lives alone. She has no problems grooming, bathing herself, getting around the house. She likes to read and reads the newspaper daily.    Children live within 1 mile of her & very supportive and involved in her care. Her children buy groceries and sort out her medications for her which she is able to take by herself.    No EtOH, no tobacco.     Widowed.   Walks with walker all the time.                Family History  Problem Relation Age of Onset  . Breast cancer Mother   . Heart disease Sister   . Heart disease Brother    Allergies  Allergen Reactions  . Sulfamethoxazole Hives     Prior to Admission medications   Medication Sig Start Date End Date Taking? Authorizing Provider  Calcium Carbonate-Vitamin D (CALCIUM-VITAMIN D) 500-200 MG-UNIT per tablet Take 1 tablet by mouth 2 (two) times daily with a meal. 05/12/12  Yes Shelly Rubenstein, MD  cephALEXin (KEFLEX) 500 MG capsule Take 1 capsule (500 mg total) by mouth 3 (three) times daily. 09/25/13  Yes Tawni Carnes, MD  fish oil-omega-3 fatty acids 1000 MG capsule Take 2 capsules (2 g total) by mouth daily. 05/12/12  Yes Shelly Rubenstein, MD  hydrochlorothiazide (HYDRODIURIL) 25 MG tablet Take 25 mg by mouth daily.   Yes Historical Provider, MD  losartan (COZAAR) 100 MG tablet Take 100 mg by mouth daily.   Yes Historical Provider, MD  metoprolol succinate (TOPROL-XL) 50 MG 24 hr tablet Take 75 mg by mouth daily. Take with or immediately following a meal.   Yes Historical Provider, MD  oxybutynin (DITROPAN-XL) 5 MG 24 hr tablet Take 5 mg by mouth daily.   Yes Historical Provider, MD  rosuvastatin (CRESTOR) 10 MG  tablet Take 10 mg by mouth daily.   Yes Historical Provider, MD  sertraline (ZOLOFT) 100 MG tablet Take 100 mg by mouth daily.   Yes Historical Provider, MD  traMADol (ULTRAM) 50 MG tablet Take 100 mg by mouth every 12 (twelve) hours as needed for moderate pain or severe pain.   Yes Historical Provider, MD  vitamin B-12 (CYANOCOBALAMIN) 1000 MCG tablet Take 1 tablet (1,000 mcg total) by mouth daily. 01/05/13  Yes Shelly Rubenstein, MD  warfarin (COUMADIN) 1 MG tablet Take 1-2 mg by mouth daily at 6 PM. Pt takes 2 mg every day except 1 mg on wed, fri   Yes Historical Provider, MD   Dg Chest 2 View  09/28/2013   CLINICAL DATA:  Cough status post fall.  EXAM: CHEST  2 VIEW  COMPARISON:  None.  FINDINGS: The cardiac silhouette is enlarged. The pulmonary vascularity is mildly prominent centrally but there is no definite cephalization of flow. There is tortuosity of the descending thoracic aorta. No pleural effusion is evident.  There is no pneumothorax nor pneumomediastinum. There are degenerative changes of both shoulders.  IMPRESSION: The findings suggest low-grade compensated CHF. There is no significant interstitial or alveolar edema.   Electronically Signed   By: David  [place]   On: 09/28/2013 11:35   Dg Femur Left  09/28/2013   CLINICAL DATA:  Pain status post fall.  EXAM: LEFT FEMUR - 2 VIEW  COMPARISON:  None.  FINDINGS: The patient has sustained an acute comminuted displaced fracture of the distal left femoral metadiaphysis. The femoral shaft is laterally positioned with respect to the distal femoral metaphysis. More proximally the femoral shaft and the visualized for portions of the femoral head and neck and intertrochanteric regions are grossly normal. There is high-grade narrowing of the medial and lateral joint compartments of the left knee. The patient has undergone previous fixation of a lateral tibial plateau fracture.  IMPRESSION: The patient has sustained an acute comminuted slightly angulated displaced fracture of the distal left femoral metadiaphysis.   Electronically Signed   By: David  [place]   On: 09/28/2013 11:41    Positive ROS: All other systems have been reviewed and were otherwise negative with the exception of those mentioned in the HPI and as above.  Labs cbc  Recent Labs  09/28/13 1316  WBC 9.1  HGB 11.0*  HCT 37.6  PLT 144*    Labs inflam No results found for this basename: ESR, CRP,  in the last 72 hours  Labs coag  Recent Labs  09/28/13 1334  INR 1.99*     Recent Labs  09/28/13 1316  NA 145  K 4.1  CL 108  CO2 29  GLUCOSE 114*  BUN 54*  CREATININE 1.34*  CALCIUM 10.1    Physical Exam: Filed Vitals:   09/28/13 1430  BP: 108/69  Pulse: 81  Temp:   Resp: 27   General: Alert, no acute distress Cardiovascular: No pedal edema Respiratory: No cyanosis, no use of accessory musculature GI: No organomegaly, abdomen is soft and non-tender Skin: No lesions in  the area of chief complaint Neurologic: Sensation intact distally Psychiatric: Patient is competent for consent with normal mood and affect Lymphatic: No axillary or cervical lymphadenopathy  MUSCULOSKELETAL:  Pain and swelling at the left leg. Distally NVI Other extremities are atraumatic with painless ROM and NVI.  Assessment: L supracondylar femur fracture  Plan: Plan for fixation in OR Tuesday FFP Weight Bearing Status: NWB PT VTE px:  SCD's and chemical post op   Margarita Rana, D, MD Cell [phone]   09/28/2013 3:21 PM

## 2013-09-28 NOTE — ED Notes (Signed)
Pt room air oxygen sats 89%-92%.

## 2013-09-28 NOTE — H&P (Signed)
Family Medicine Teaching [facility] Admission History and Physical Service Pager: [phone]  Patient name: Laura Griffith Medical record number: [id number] Date of birth: 1933/01/29 Age: 79 y.o. Gender: female  Primary Care Provider: Tawni Carnes, MD Consultants: Orthopedics Code Status: DNI  Chief Complaint: Fall  Assessment and Plan: Laura Griffith is a 79 y.o. female presenting with a femur fracture. PMH is significant for pulmonary hypertension, hypertension, dCHF and atrial fibrillation  #Femur fracture:  Follow-up orthopedic recommendations for surgery  NPO after midnight  Morphine for pain  #Atrial fibrillation: currently rate controlled.  Continue metooprolol  Hold warfarin  Given FFPs for INR of 1.99  #Hypertension: currently controlled. Worried about hypotension with three BP meds  Reorder metoprolol as metoprolol tartrate 37.5mg  BID  Hold hctz and losartan  #Wheezing: no known history of asthma or COPD. Does not use an albuterol inhaler at home.  levalbuterol nebulizer q8hours for wheezing  Monitor for possible increase HR   FEN/GI: cardiac diet; NPO after midnight Prophylaxis: SCDs  Disposition: admit to telemetry  History of Present Illness: Laura Griffith is a 79 y.o. female presenting with fall. Was in the kitchen and daughter helped her to the bathroom but she could not hold her and pt fell and when she fell, fractured her femur. She did not hit her head. She did not lose consciousness. She is on keflex at home for a UTI. While in the ED, she received on dose of ceftriaxone and FFP to reverse increased INR. Orthopedic surgery was called and wants medicine team to admit for medical management prior to surgery.  Review Of Systems: Per HPI with the following additions:  Otherwise 12 point review of systems was performed and was unremarkable.  Patient Active Problem List   Diagnosis Date Noted  . UTI (urinary tract infection) 09/02/2013  .  Anemia 01/08/2013  . Preventative health care 08/01/2011  . HYPERLIPIDEMIA-MIXED 08/01/2010  . ABNORMALITY OF GAIT 06/01/2010  . ATRIAL FIBRILLATION 07/21/2009  . B12 DEFICIENCY 07/02/2008  . ANEURYSM, THORACIC AORTIC 08/12/2007  . ANEMIA NOS 06/15/2007  . PULMONARY HYPERTENSION 06/15/2007  . OBESITY, NOS 01/16/2007  . HYPERTENSION, BENIGN SYSTEMIC 01/16/2007  . CHF, EJECTION FRACTION > OR = 50% 01/16/2007  . DIVERTICULOSIS OF COLON 01/16/2007  . RENAL INSUFFICIENCY, CHRONIC 01/16/2007  . OSTEOARTHRITIS, MULTI SITES 01/16/2007   Past Medical History: Past Medical History  Diagnosis Date  . Obesity   . Depression   . HTN (hypertension)   . Hyperlipemia   . Renal insufficiency     baseline 1.6-1.8 - Cr 0.7 (`00) 1.6  8/02  1.5 (2/03), Cr. 0.9  8/03 1.1 (11/05), Creatinine Clearance 8/02 about 50 ml/min MSE:  30/30 (09/2006)  . Aortic aneurysm     Followed by Dr. Dorris Fetch.  CT chest (3/11) with stable 4.8 cm ascending  aortic aneurysm.  Marland Kitchen Atrial fibrillation     Noted initially at adenosine myoview 8/10.  She seems to be in chronic atrial fibrillation  at this point.  . Osteoarthritis   . Embolism - blood clot     insertion of vena cava filter   Past Surgical History: Past Surgical History  Procedure Laterality Date  . Cataract extraction    . Cholecystectomy    . Hernia repair    . Hysterotomy    . Osteotomy     Social History: History  Substance Use Topics  . Smoking status: Never Smoker   . Smokeless tobacco: Never Used  . Alcohol Use: No   Additional social  history:   Please also refer to relevant sections of EMR.  Family History: Family History  Problem Relation Age of Onset  . Breast cancer Mother   . Heart disease Sister   . Heart disease Brother    Allergies and Medications: Allergies  Allergen Reactions  . Sulfamethoxazole Hives   No current facility-administered medications on file prior to encounter.   Current Outpatient Prescriptions on  File Prior to Encounter  Medication Sig Dispense Refill  . Calcium Carbonate-Vitamin D (CALCIUM-VITAMIN D) 500-200 MG-UNIT per tablet Take 1 tablet by mouth 2 (two) times daily with a meal.  60 tablet  6  . cephALEXin (KEFLEX) 500 MG capsule Take 1 capsule (500 mg total) by mouth 3 (three) times daily.  30 capsule  0  . fish oil-omega-3 fatty acids 1000 MG capsule Take 2 capsules (2 g total) by mouth daily.  60 capsule  6  . vitamin B-12 (CYANOCOBALAMIN) 1000 MCG tablet Take 1 tablet (1,000 mcg total) by mouth daily.  90 tablet  0  . [DISCONTINUED] oxybutynin (DITROPAN-XL) 5 MG 24 hr tablet Take 1 tablet (5 mg total) by mouth daily.  30 tablet  6    Objective: BP 125/70  Pulse 81  Temp(Src) 98.1 F (36.7 C) (Oral)  Resp 20  SpO2 96%  Exam: General: Laying in bed in no acute distress Cardiovascular: Regular rate/rhythm, no murmurs, rubs or gallops, distant heart sounds due to body habitus Respiratory: +wheezes heard on anterior auscultation, good air movement, no rales Abdomen: soft, obese, non-tender Extremities: marked edema bilaterally LE, non-pitting, 2+ pedal pulses Skin: ecchymosis on left forearm from venipuncture Neuro: Alert, oriented x3  Labs and Imaging: CBC BMET   Recent Labs Lab 09/28/13 1316  WBC 9.1  HGB 11.0*  HCT 37.6  PLT 144*    Recent Labs Lab 09/28/13 1316  NA 145  K 4.1  CL 108  CO2 29  BUN 54*  CREATININE 1.34*  GLUCOSE 114*  CALCIUM 10.1     Urinalysis    Component Value Date/Time   COLORURINE YELLOW 09/28/2013 1318   APPEARANCEUR CLOUDY* 09/28/2013 1318   LABSPEC 1.016 09/28/2013 1318   PHURINE 5.5 09/28/2013 1318   GLUCOSEU NEGATIVE 09/28/2013 1318   HGBUR MODERATE* 09/28/2013 1318   BILIRUBINUR NEGATIVE 09/28/2013 1318   BILIRUBINUR negativve 09/02/2013 0910   KETONESUR NEGATIVE 09/28/2013 1318   PROTEINUR NEGATIVE 09/28/2013 1318   UROBILINOGEN 0.2 09/28/2013 1318   UROBILINOGEN 0.2 09/02/2013 0910   NITRITE NEGATIVE  09/28/2013 1318   NITRITE positive 09/02/2013 0910   LEUKOCYTESUR LARGE* 09/28/2013 1318   INR: 1.99  Femur X-ray (11/10) FINDINGS:  The patient has sustained an acute comminuted displaced fracture of  the distal left femoral metadiaphysis. The femoral shaft is  laterally positioned with respect to the distal femoral metaphysis.  More proximally the femoral shaft and the visualized for portions of  the femoral head and neck and intertrochanteric regions are grossly  normal. There is high-grade narrowing of the medial and lateral  joint compartments of the left knee. The patient has undergone  previous fixation of a lateral tibial plateau fracture.  IMPRESSION:  The patient has sustained an acute comminuted slightly angulated  displaced fracture of the distal left femoral metadiaphysis.  Chest X-ray (11/10) FINDINGS:  The cardiac silhouette is enlarged. The pulmonary vascularity is  mildly prominent centrally but there is no definite cephalization of  flow. There is tortuosity of the descending thoracic aorta. No  pleural effusion is evident. There  is no pneumothorax nor  pneumomediastinum. There are degenerative changes of both shoulders.  IMPRESSION:  The findings suggest low-grade compensated CHF. There is no  significant interstitial or alveolar edema.  CT Knee (11/10) FINDINGS:  There is an impacted displaced and comminuted fracture through the  distal left femur. Approximately 14 mm of overlap/impaction.  Approximately 10 mm posterior displacement. There is also mild  medial displacement of the distal fracture fragments. Moderate joint  effusion.  Advanced degenerative changes within the left knee with near  complete joint space loss, exuberant osteophyte formation and  subchondral sclerosis. Hardware rash that remote hardware within the  proximal tibia.  IMPRESSION:  Impacted, comminuted and displaced distal femoral fracture as  described above.  Advanced  osteoarthritis of the left knee.   Jacquelin Hawking, MD 09/28/2013, 6:10 PM PGY-1, [facility] Family Medicine FPTS Intern pager: [phone], text pages welcome   Teaching Service Addendum.  I have seen and evaluated this pt and agree with Dr. Caleb Popp assessment and plan as is documented on this note.   D. [place] Rolene Arbour, MD Family Medicine  PGY-3

## 2013-09-28 NOTE — ED Provider Notes (Signed)
CSN: [id number]     Arrival date & time 09/28/13  1046 History   First MD Initiated Contact with Patient 09/28/13 1146     Chief Complaint  Patient presents with  . Fall   (Consider location/radiation/quality/duration/timing/severity/associated sxs/prior Treatment) HPI Comments: Patient is a 79 year old female with history of obesity, depression, hypertension, hyperlipidemia, renal insufficiency, aortic aneurysm, A. Fib, osteoarthritis, PT who presents today with left knee pain after a witnessed fall earlier today. The patient's daughter reports that she was using her walker to move around when her left leg gave out from under her. She reports that the left leg bent "really far backwards". The patient has not been ambulatory since the fall. The pain is sharp without radiation. The patient reports she has had very little pain when she is still. She did not hit her head during the fall. Her daughter caught her. She is on anticoagulation which is monitored at a Coumadin clinic. At her baseline the patient is very sharp, however her mental function has been declining over teh past year. Her daughter believes this is due to an untreated UTI. She was seen by her PCP yesterday and given Keflex. She is oriented to person, place, and time. Currently she has no pain elsewhere. No headache, photophobia, visual disturbance, nausea, vomiting, abdominal pain.   Patient is a 79 y.o. female presenting with fall. The history is provided by the patient and a relative. No language interpreter was used.  Fall Associated symptoms include arthralgias, coughing and myalgias. Pertinent negatives include no chills or fever.    Past Medical History  Diagnosis Date  . Obesity   . Depression   . HTN (hypertension)   . Hyperlipemia   . Renal insufficiency     baseline 1.6-1.8 - Cr 0.7 (`00) 1.6  8/02  1.5 (2/03), Cr. 0.9  8/03 1.1 (11/05), Creatinine Clearance 8/02 about 50 ml/min MSE:  30/30 (09/2006)  . Aortic aneurysm      Followed by Dr. Dorris Fetch.  CT chest (3/11) with stable 4.8 cm ascending  aortic aneurysm.  Marland Kitchen Atrial fibrillation     Noted initially at adenosine myoview 8/10.  She seems to be in chronic atrial fibrillation  at this point.  . Osteoarthritis   . Embolism - blood clot     insertion of vena cava filter   Past Surgical History  Procedure Laterality Date  . Cataract extraction    . Cholecystectomy    . Hernia repair    . Hysterotomy    . Osteotomy     Family History  Problem Relation Age of Onset  . Breast cancer Mother   . Heart disease Sister   . Heart disease Brother    History  Substance Use Topics  . Smoking status: Never Smoker   . Smokeless tobacco: Never Used  . Alcohol Use: No   OB History   Grav Para Term Preterm Abortions TAB SAB Ect Mult Living                 Review of Systems  Constitutional: Negative for fever and chills.  Respiratory: Positive for cough. Negative for shortness of breath.   Genitourinary: Positive for dysuria and frequency.  Musculoskeletal: Positive for arthralgias and myalgias.  Skin: Negative for wound.  All other systems reviewed and are negative.    Allergies  Sulfamethoxazole  Home Medications   Current Outpatient Rx  Name  Route  Sig  Dispense  Refill  . Calcium Carbonate-Vitamin D (CALCIUM-VITAMIN D) 500-200  MG-UNIT per tablet   Oral   Take 1 tablet by mouth 2 (two) times daily with a meal.   60 tablet   6   . cephALEXin (KEFLEX) 500 MG capsule   Oral   Take 1 capsule (500 mg total) by mouth 3 (three) times daily.   30 capsule   0   . fish oil-omega-3 fatty acids 1000 MG capsule   Oral   Take 2 capsules (2 g total) by mouth daily.   60 capsule   6   . hydrochlorothiazide (HYDRODIURIL) 25 MG tablet   Oral   Take 25 mg by mouth daily.         Marland Kitchen losartan (COZAAR) 100 MG tablet   Oral   Take 100 mg by mouth daily.         . metoprolol succinate (TOPROL-XL) 50 MG 24 hr tablet   Oral   Take 75 mg by  mouth daily. Take with or immediately following a meal.         . oxybutynin (DITROPAN-XL) 5 MG 24 hr tablet   Oral   Take 5 mg by mouth daily.         . rosuvastatin (CRESTOR) 10 MG tablet   Oral   Take 10 mg by mouth daily.         . sertraline (ZOLOFT) 100 MG tablet   Oral   Take 100 mg by mouth daily.         . traMADol (ULTRAM) 50 MG tablet   Oral   Take 100 mg by mouth every 12 (twelve) hours as needed for moderate pain or severe pain.         . vitamin B-12 (CYANOCOBALAMIN) 1000 MCG tablet   Oral   Take 1 tablet (1,000 mcg total) by mouth daily.   90 tablet   0   . warfarin (COUMADIN) 1 MG tablet   Oral   Take 1-2 mg by mouth daily at 6 PM. Pt takes 2 mg every day except 1 mg on wed, fri          BP 100/56  Pulse 78  Temp(Src) 97.9 F (36.6 C) (Oral)  Resp 26  SpO2 96% Physical Exam  Nursing note and vitals reviewed. Constitutional: She is oriented to person, place, and time. She appears well-developed and well-nourished. No distress.  Morbidly obese.   HENT:  Head: Normocephalic and atraumatic.  Right Ear: External ear normal.  Left Ear: External ear normal.  Nose: Nose normal.  Mouth/Throat: Oropharynx is clear and moist.  Eyes: Conjunctivae and EOM are normal. Pupils are equal, round, and reactive to light.  Neck: Normal range of motion.  Cardiovascular: Normal rate, regular rhythm and normal heart sounds.   Pulmonary/Chest: Effort normal. No stridor. No respiratory distress. She has wheezes. She has no rales.  Abdominal: Soft. She exhibits no distension.  Musculoskeletal: Normal range of motion.  Pelvis stable. No tenderness to palpation over hips bilaterally. TTP over left distal femur. Neurovascularly intact. Compartment soft. Sensation intact.   Neurological: She is alert and oriented to person, place, and time. She has normal strength. No sensory deficit. Coordination normal.  Finger nose finger normal.   Skin: Skin is warm and dry. She  is not diaphoretic. No erythema.  Psychiatric: She has a normal mood and affect. Her behavior is normal.    ED Course  Procedures (including critical care time) Labs Review Labs Reviewed  CBC WITH DIFFERENTIAL - Abnormal; Notable for the following:  Hemoglobin 11.0 (*)    MCH 25.8 (*)    MCHC 29.3 (*)    RDW 18.6 (*)    Platelets 144 (*)    All other components within normal limits  BASIC METABOLIC PANEL - Abnormal; Notable for the following:    Glucose, Bld 114 (*)    BUN 54 (*)    Creatinine, Ser 1.34 (*)    GFR calc non Af Amer 37 (*)    GFR calc Af Amer 43 (*)    All other components within normal limits  URINALYSIS, ROUTINE W REFLEX MICROSCOPIC - Abnormal; Notable for the following:    APPearance CLOUDY (*)    Hgb urine dipstick MODERATE (*)    Leukocytes, UA LARGE (*)    All other components within normal limits  PROTIME-INR - Abnormal; Notable for the following:    Prothrombin Time 22.0 (*)    INR 1.99 (*)    All other components within normal limits  URINE MICROSCOPIC-ADD ON - Abnormal; Notable for the following:    Squamous Epithelial / LPF FEW (*)    Bacteria, UA MANY (*)    All other components within normal limits  URINE CULTURE  GRAM STAIN  PREPARE FRESH FROZEN PLASMA  ABO/RH  TYPE AND SCREEN   Imaging Review Dg Chest 2 View  09/28/2013   CLINICAL DATA:  Cough status post fall.  EXAM: CHEST  2 VIEW  COMPARISON:  None.  FINDINGS: The cardiac silhouette is enlarged. The pulmonary vascularity is mildly prominent centrally but there is no definite cephalization of flow. There is tortuosity of the descending thoracic aorta. No pleural effusion is evident. There is no pneumothorax nor pneumomediastinum. There are degenerative changes of both shoulders.  IMPRESSION: The findings suggest low-grade compensated CHF. There is no significant interstitial or alveolar edema.   Electronically Signed   By: David  [place]   On: 09/28/2013 11:35   Dg Femur Left  09/28/2013    CLINICAL DATA:  Pain status post fall.  EXAM: LEFT FEMUR - 2 VIEW  COMPARISON:  None.  FINDINGS: The patient has sustained an acute comminuted displaced fracture of the distal left femoral metadiaphysis. The femoral shaft is laterally positioned with respect to the distal femoral metaphysis. More proximally the femoral shaft and the visualized for portions of the femoral head and neck and intertrochanteric regions are grossly normal. There is high-grade narrowing of the medial and lateral joint compartments of the left knee. The patient has undergone previous fixation of a lateral tibial plateau fracture.  IMPRESSION: The patient has sustained an acute comminuted slightly angulated displaced fracture of the distal left femoral metadiaphysis.   Electronically Signed   By: David  [place]   On: 09/28/2013 11:41    EKG Interpretation   None       2:56 PM Discussed case with Dr. Eulah Pont who will put pt on surgery schedule for tomorrow. Admit to hospitalist service. Give FFP.   MDM   1. Fracture of distal femur, left, closed, initial encounter    Patient admitted to family medicine for medical clearance for surgery. Dr. Eulah Pont aware of case and pt is on OR schedule for tomorrow. Pain controlled with fentanyl. Vital signs stable at this point although patient has been hypotensive throughout ED stay. CT of distal femur is pending. I have given 250 ml of NS due to patient's heart failure. I discussed this case with Dr. Karma Ganja who agrees with plan. Dr. Jodi Mourning aware of patient's while she stays in the  ED.     Mora Bellman, PA-C 09/28/13 1710

## 2013-09-28 NOTE — ED Notes (Signed)
Per [facility] - pt had a fall this morning, c/o pain to upper left leg, unable to bare weight on that leg, usually walks with a walker. Daughter found pt with left leg behind her. Pt had recent uti and was on antibiotics for it. Hx of HTN. Swelling to right leg. A&o x4. BP 150 palpated. Taking coumadin. HR 90. Chest cold that started last week, hasn't been seen by physician for that yet, productive cough with yellow mucus.

## 2013-09-28 NOTE — ED Notes (Signed)
Lab at bedside

## 2013-09-29 ENCOUNTER — Inpatient Hospital Stay (HOSPITAL_COMMUNITY): Payer: Medicare HMO

## 2013-09-29 ENCOUNTER — Encounter (HOSPITAL_COMMUNITY)
Admission: EM | Disposition: A | Discharge status: Skilled Nursing Facility | Payer: Self-pay | Source: Home / Self Care | Attending: Family Medicine

## 2013-09-29 ENCOUNTER — Encounter (HOSPITAL_COMMUNITY): Payer: Self-pay | Admitting: Anesthesiology

## 2013-09-29 ENCOUNTER — Inpatient Hospital Stay (HOSPITAL_COMMUNITY): Payer: Medicare HMO | Admitting: Anesthesiology

## 2013-09-29 ENCOUNTER — Encounter (HOSPITAL_COMMUNITY): Payer: Medicare HMO | Admitting: Anesthesiology

## 2013-09-29 HISTORY — PX: FEMUR IM NAIL: SHX1597

## 2013-09-29 LAB — PROTIME-INR: INR: 2 — ABNORMAL HIGH (ref 0.00–1.49)

## 2013-09-29 LAB — BASIC METABOLIC PANEL
BUN: 39 mg/dL — ABNORMAL HIGH (ref 6–23)
CO2: 29 mEq/L (ref 19–32)
CO2: 32 mEq/L (ref 19–32)
Calcium: 9.2 mg/dL (ref 8.4–10.5)
Chloride: 103 mEq/L (ref 96–112)
Creatinine, Ser: 1.27 mg/dL — ABNORMAL HIGH (ref 0.50–1.10)
Creatinine, Ser: 1.29 mg/dL — ABNORMAL HIGH (ref 0.50–1.10)
GFR calc Af Amer: 46 mL/min — ABNORMAL LOW (ref >90)
GFR calc Af Amer: 45 mL/min — ABNORMAL LOW (ref >90)
GFR calc non Af Amer: 39 mL/min — ABNORMAL LOW (ref >90)
GFR calc non Af Amer: 39 mL/min — ABNORMAL LOW (ref >90)
Glucose, Bld: 116 mg/dL — ABNORMAL HIGH (ref 70–99)
Potassium: 3.1 mEq/L — ABNORMAL LOW (ref 3.5–5.1)
Potassium: 3.6 mEq/L (ref 3.5–5.1)
Sodium: 142 mEq/L (ref 135–145)

## 2013-09-29 LAB — SURGICAL PCR SCREEN
MRSA, PCR: NEGATIVE
Staphylococcus aureus: NEGATIVE

## 2013-09-29 LAB — PREPARE FRESH FROZEN PLASMA: Unit division: 0

## 2013-09-29 LAB — CBC
Platelets: 134 10*3/uL — ABNORMAL LOW (ref 150–400)
RBC: 3.46 MIL/uL — ABNORMAL LOW (ref 3.87–5.11)
RDW: 18.6 % — ABNORMAL HIGH (ref 11.5–15.5)
WBC: 7 10*3/uL (ref 4.0–10.5)

## 2013-09-29 SURGERY — INSERTION, INTRAMEDULLARY ROD, FEMUR, RETROGRADE
Anesthesia: General | Site: Leg Upper | Laterality: Left | Wound class: Clean

## 2013-09-29 MED ORDER — HYDROCODONE-ACETAMINOPHEN 5-325 MG PO TABS
1.0000 | ORAL_TABLET | Freq: Four times a day (QID) | ORAL | Status: DC | PRN
  Administered 2013-09-29: 1 via ORAL
  Filled 2013-09-29: qty 1

## 2013-09-29 MED ORDER — WARFARIN SODIUM 1 MG PO TABS: 1.0000 mg | ORAL_TABLET | Freq: Every day | ORAL | Status: DC

## 2013-09-29 MED ORDER — ONDANSETRON HCL 4 MG/2ML IJ SOLN: 4.0000 mg | Freq: Four times a day (QID) | INTRAMUSCULAR | Status: DC | PRN

## 2013-09-29 MED ORDER — CEFAZOLIN SODIUM-DEXTROSE 2-3 GM-% IV SOLR
2.0000 g | Freq: Once | INTRAVENOUS | Status: AC
  Administered 2013-09-29: 1 g via INTRAVENOUS
  Filled 2013-09-29: qty 50

## 2013-09-29 MED ORDER — ROCURONIUM BROMIDE 100 MG/10ML IV SOLN
INTRAVENOUS | Status: DC | PRN
  Administered 2013-09-29: 40 mg via INTRAVENOUS

## 2013-09-29 MED ORDER — ONDANSETRON HCL 4 MG/2ML IJ SOLN
INTRAMUSCULAR | Status: DC | PRN
  Administered 2013-09-29: 4 mg via INTRAVENOUS

## 2013-09-29 MED ORDER — ONDANSETRON HCL 4 MG PO TABS: 4.0000 mg | ORAL_TABLET | Freq: Four times a day (QID) | ORAL | Status: DC | PRN

## 2013-09-29 MED ORDER — PROPRANOLOL HCL 1 MG/ML IV SOLN
INTRAVENOUS | Status: DC | PRN
  Administered 2013-09-29: .5 mg via INTRAVENOUS

## 2013-09-29 MED ORDER — NEOSTIGMINE METHYLSULFATE 1 MG/ML IJ SOLN
INTRAMUSCULAR | Status: DC | PRN
  Administered 2013-09-29: 3 mg via INTRAVENOUS

## 2013-09-29 MED ORDER — CEFAZOLIN SODIUM-DEXTROSE 2-3 GM-% IV SOLR
2.0000 g | Freq: Four times a day (QID) | INTRAVENOUS | Status: AC
  Administered 2013-09-29 – 2013-09-30 (×2): 2 g via INTRAVENOUS
  Filled 2013-09-29 (×2): qty 50

## 2013-09-29 MED ORDER — METOCLOPRAMIDE HCL 5 MG/ML IJ SOLN
5.0000 mg | Freq: Three times a day (TID) | INTRAMUSCULAR | Status: DC | PRN
  Filled 2013-09-29: qty 2

## 2013-09-29 MED ORDER — WARFARIN SODIUM 2 MG PO TABS
2.0000 mg | ORAL_TABLET | ORAL | Status: DC
  Filled 2013-09-29: qty 1

## 2013-09-29 MED ORDER — PHENYLEPHRINE HCL 10 MG/ML IJ SOLN
INTRAMUSCULAR | Status: DC | PRN
  Administered 2013-09-29: 80 ug via INTRAVENOUS
  Administered 2013-09-29: 40 ug via INTRAVENOUS
  Administered 2013-09-29: 80 ug via INTRAVENOUS
  Administered 2013-09-29: 40 ug via INTRAVENOUS

## 2013-09-29 MED ORDER — LIDOCAINE HCL (CARDIAC) 20 MG/ML IV SOLN
INTRAVENOUS | Status: DC | PRN
  Administered 2013-09-29: 100 mg via INTRATRACHEAL
  Administered 2013-09-29: 60 mg via INTRAVENOUS

## 2013-09-29 MED ORDER — HYDROMORPHONE HCL PF 1 MG/ML IJ SOLN: 0.2500 mg | INTRAMUSCULAR | Status: DC | PRN

## 2013-09-29 MED ORDER — LACTATED RINGERS IV SOLN
INTRAVENOUS | Status: DC | PRN
  Administered 2013-09-29 (×2): via INTRAVENOUS

## 2013-09-29 MED ORDER — WARFARIN SODIUM 1 MG PO TABS
1.0000 mg | ORAL_TABLET | ORAL | Status: DC
  Administered 2013-09-30: 1 mg via ORAL
  Filled 2013-09-29: qty 1

## 2013-09-29 MED ORDER — CEFAZOLIN SODIUM 1-5 GM-% IV SOLN
INTRAVENOUS | Status: AC
  Filled 2013-09-29: qty 50

## 2013-09-29 MED ORDER — WARFARIN - PHYSICIAN DOSING INPATIENT
Freq: Every day | Status: DC
  Administered 2013-09-30: 18:00:00

## 2013-09-29 MED ORDER — METOCLOPRAMIDE HCL 5 MG PO TABS
5.0000 mg | ORAL_TABLET | Freq: Three times a day (TID) | ORAL | Status: DC | PRN
  Filled 2013-09-29: qty 2

## 2013-09-29 MED ORDER — GLYCOPYRROLATE 0.2 MG/ML IJ SOLN
INTRAMUSCULAR | Status: DC | PRN
  Administered 2013-09-29: 0.4 mg via INTRAVENOUS

## 2013-09-29 MED ORDER — SODIUM CHLORIDE 0.9 % IV BOLUS (SEPSIS)
500.0000 mL | Freq: Once | INTRAVENOUS | Status: AC
  Administered 2013-09-29: 500 mL via INTRAVENOUS

## 2013-09-29 MED ORDER — PHENOL 1.4 % MT LIQD
1.0000 | OROMUCOSAL | Status: DC | PRN
  Filled 2013-09-29: qty 177

## 2013-09-29 MED ORDER — CHLORHEXIDINE GLUCONATE 4 % EX LIQD
Freq: Once | CUTANEOUS | Status: AC
  Administered 2013-09-29: 13:00:00 via TOPICAL
  Filled 2013-09-29 (×2): qty 15

## 2013-09-29 MED ORDER — FENTANYL CITRATE 0.05 MG/ML IJ SOLN
INTRAMUSCULAR | Status: DC | PRN
  Administered 2013-09-29: 50 ug via INTRAVENOUS

## 2013-09-29 MED ORDER — ASPIRIN EC 325 MG PO TBEC
325.0000 mg | DELAYED_RELEASE_TABLET | Freq: Every day | ORAL | Status: DC
  Administered 2013-09-30: 325 mg via ORAL
  Filled 2013-09-29 (×2): qty 1

## 2013-09-29 MED ORDER — MENTHOL 3 MG MT LOZG
1.0000 | LOZENGE | OROMUCOSAL | Status: DC | PRN
  Filled 2013-09-29: qty 9

## 2013-09-29 MED ORDER — OXYCODONE HCL 5 MG/5ML PO SOLN: 5.0000 mg | Freq: Once | ORAL | Status: DC | PRN

## 2013-09-29 MED ORDER — OXYCODONE HCL 5 MG PO TABS: 5.0000 mg | ORAL_TABLET | Freq: Once | ORAL | Status: DC | PRN

## 2013-09-29 MED ORDER — 0.9 % SODIUM CHLORIDE (POUR BTL) OPTIME
TOPICAL | Status: DC | PRN
  Administered 2013-09-29: 1000 mL

## 2013-09-29 MED ORDER — LACTATED RINGERS IV SOLN
INTRAVENOUS | Status: DC
  Administered 2013-09-29 – 2013-10-01 (×3): via INTRAVENOUS

## 2013-09-29 MED ORDER — SODIUM CHLORIDE 0.9 % IV SOLN
INTRAVENOUS | Status: DC | PRN
  Administered 2013-09-29: 16:00:00 via INTRAVENOUS

## 2013-09-29 MED ORDER — PROPOFOL 10 MG/ML IV BOLUS
INTRAVENOUS | Status: DC | PRN
  Administered 2013-09-29: 130 mg via INTRAVENOUS

## 2013-09-29 SURGICAL SUPPLY — 74 items
BANDAGE ELASTIC 4 VELCRO ST LF (GAUZE/BANDAGES/DRESSINGS) ×2 IMPLANT
BANDAGE ELASTIC 6 VELCRO ST LF (GAUZE/BANDAGES/DRESSINGS) IMPLANT
BANDAGE ESMARK 6X9 LF (GAUZE/BANDAGES/DRESSINGS) IMPLANT
BANDAGE GAUZE ELAST BULKY 4 IN (GAUZE/BANDAGES/DRESSINGS) IMPLANT
BIT DRILL AO GAMMA 4.2X130 (BIT) ×2 IMPLANT
BIT DRILL AO GAMMA 4.2X340 (BIT) ×2 IMPLANT
BLADE SURG 15 STRL LF DISP TIS (BLADE) IMPLANT
BLADE SURG 15 STRL SS (BLADE)
BLADE SURG ROTATE 9660 (MISCELLANEOUS) IMPLANT
BNDG COHESIVE 4X5 TAN STRL (GAUZE/BANDAGES/DRESSINGS) ×2 IMPLANT
BNDG COHESIVE 6X5 TAN STRL LF (GAUZE/BANDAGES/DRESSINGS) IMPLANT
BNDG ELASTIC 6X15 VLCR STRL LF (GAUZE/BANDAGES/DRESSINGS) ×2 IMPLANT
BNDG ESMARK 6X9 LF (GAUZE/BANDAGES/DRESSINGS)
BOOTCOVER CLEANROOM LRG (PROTECTIVE WEAR) ×4 IMPLANT
CLOTH BEACON ORANGE TIMEOUT ST (SAFETY) IMPLANT
COVER SURGICAL LIGHT HANDLE (MISCELLANEOUS) ×2 IMPLANT
CUFF TOURNIQUET SINGLE 34IN LL (TOURNIQUET CUFF) IMPLANT
CUFF TOURNIQUET SINGLE 44IN (TOURNIQUET CUFF) IMPLANT
DRAPE C-ARM 42X72 X-RAY (DRAPES) ×2 IMPLANT
DRAPE C-ARMOR (DRAPES) ×2 IMPLANT
DRAPE ORTHO SPLIT 77X108 STRL (DRAPES) ×2
DRAPE PROXIMA HALF (DRAPES) ×4 IMPLANT
DRAPE SURG ORHT 6 SPLT 77X108 (DRAPES) ×2 IMPLANT
DRAPE U-SHAPE 47X51 STRL (DRAPES) ×2 IMPLANT
DRSG ADAPTIC 3X8 NADH LF (GAUZE/BANDAGES/DRESSINGS) IMPLANT
DRSG MEPILEX BORDER 4X4 (GAUZE/BANDAGES/DRESSINGS) ×2 IMPLANT
DRSG MEPILEX BORDER 4X8 (GAUZE/BANDAGES/DRESSINGS) ×2 IMPLANT
DRSG PAD ABDOMINAL 8X10 ST (GAUZE/BANDAGES/DRESSINGS) ×4 IMPLANT
DURAPREP 26ML APPLICATOR (WOUND CARE) ×2 IMPLANT
ELECT REM PT RETURN 9FT ADLT (ELECTROSURGICAL) ×2
ELECTRODE REM PT RTRN 9FT ADLT (ELECTROSURGICAL) ×1 IMPLANT
END CAP (Cap) ×2 IMPLANT
FACESHIELD LNG OPTICON STERILE (SAFETY) IMPLANT
GLOVE BIO SURGEON STRL SZ7.5 (GLOVE) ×4 IMPLANT
GLOVE BIOGEL PI IND STRL 8 (GLOVE) ×2 IMPLANT
GLOVE BIOGEL PI INDICATOR 8 (GLOVE) ×2
GLOVE BIOGEL PI ORTHO PRO SZ8 (GLOVE) ×1
GLOVE ORTHO TXT STRL SZ7.5 (GLOVE) IMPLANT
GLOVE PI ORTHO PRO STRL SZ8 (GLOVE) ×1 IMPLANT
GLOVE SURG ORTHO 8.0 STRL STRW (GLOVE) ×2 IMPLANT
GLOVE SURG SS PI 8.0 STRL IVOR (GLOVE) ×2 IMPLANT
GOWN SRG XL XLNG 56XLVL 4 (GOWN DISPOSABLE) ×1 IMPLANT
GOWN STRL NON-REIN LRG LVL3 (GOWN DISPOSABLE) IMPLANT
GOWN STRL NON-REIN XL XLG LVL4 (GOWN DISPOSABLE) ×1
GOWN STRL REIN 2XL XLG LVL4 (GOWN DISPOSABLE) ×4 IMPLANT
GUIDEROD T2 3X1000 (ROD) ×4 IMPLANT
KIT BASIN OR (CUSTOM PROCEDURE TRAY) ×2 IMPLANT
KIT ROOM TURNOVER OR (KITS) ×2 IMPLANT
MANIFOLD NEPTUNE II (INSTRUMENTS) ×2 IMPLANT
NAIL SUPRACONDYLAR 11X340MM (Nail) ×2 IMPLANT
NS IRRIG 1000ML POUR BTL (IV SOLUTION) ×2 IMPLANT
PACK GENERAL/GYN (CUSTOM PROCEDURE TRAY) ×2 IMPLANT
PAD ARMBOARD 7.5X6 YLW CONV (MISCELLANEOUS) ×4 IMPLANT
REAMER SHAFT BIXCUT (INSTRUMENTS) ×2 IMPLANT
SCREW LOCKING T2 F/T  5MMX35MM (Screw) ×1 IMPLANT
SCREW LOCKING T2 F/T  5MMX65MM (Screw) ×1 IMPLANT
SCREW LOCKING T2 F/T  5MMX70MM (Screw) ×1 IMPLANT
SCREW LOCKING T2 F/T  5MMX80MM (Screw) ×4 IMPLANT
SCREW LOCKING T2 F/T 5MMX35MM (Screw) ×1 IMPLANT
SCREW LOCKING T2 F/T 5MMX65MM (Screw) ×1 IMPLANT
SCREW LOCKING T2 F/T 5MMX70MM (Screw) ×1 IMPLANT
SPONGE GAUZE 4X4 12PLY (GAUZE/BANDAGES/DRESSINGS) IMPLANT
STAPLER VISISTAT 35W (STAPLE) ×2 IMPLANT
STOCKINETTE IMPERVIOUS LG (DRAPES) ×2 IMPLANT
SUT MNCRL AB 4-0 PS2 18 (SUTURE) IMPLANT
SUT VIC AB 0 CT1 18XCR BRD 8 (SUTURE) ×2 IMPLANT
SUT VIC AB 0 CT1 8-18 (SUTURE) ×2
SUT VIC AB 2-0 CT1 27 (SUTURE)
SUT VIC AB 2-0 CT1 TAPERPNT 27 (SUTURE) IMPLANT
SUT VIC AB 3-0 SH 8-18 (SUTURE) IMPLANT
TOWEL OR 17X24 6PK STRL BLUE (TOWEL DISPOSABLE) ×2 IMPLANT
TOWEL OR 17X26 10 PK STRL BLUE (TOWEL DISPOSABLE) ×2 IMPLANT
TRAY FOLEY CATH 16FRSI W/METER (SET/KITS/TRAYS/PACK) IMPLANT
WATER STERILE IRR 1000ML POUR (IV SOLUTION) IMPLANT

## 2013-09-29 NOTE — Interval H&P Note (Signed)
History and Physical Interval Note:  09/29/2013 8:40 AM  Laura Griffith  has presented today for surgery, with the diagnosis of Left Distal Femur Fracture  The various methods of treatment have been discussed with the patient and family. After consideration of risks, benefits and other options for treatment, the patient has consented to  Procedure(s): INTRAMEDULLARY (IM) RETROGRADE FEMORAL NAILING (Left) as a surgical intervention .  The patient's history has been reviewed, patient examined, no change in status, stable for surgery.  I have reviewed the patient's chart and labs.  Questions were answered to the patient's satisfaction.     Vijay Durflinger, D

## 2013-09-29 NOTE — Progress Notes (Signed)
PT Cancellation Note/ Discharge  Patient Details Name: Laura Griffith MRN: [id number] DOB: 1933/05/05   Cancelled Treatment:    Reason Eval/Treat Not Completed: Other (comment) (ordered prior to surgery. will need order post op with weight bearing)   Toney [facility] 09/29/2013, 11:05 AM [name], PT [phone]

## 2013-09-29 NOTE — Op Note (Signed)
09/28/2013 - 09/29/2013  4:22 PM  PATIENT:  Laura Griffith    PRE-OPERATIVE DIAGNOSIS:  Left Distal Femur Fracture  POST-OPERATIVE DIAGNOSIS:  Same  PROCEDURE:  INTRAMEDULLARY (IM) RETROGRADE FEMORAL NAILING  SURGEON:  Kissa Campoy, D, MD  ASSISTANT: Janace Litten, OPA  ANESTHESIA:   Gen  PREOPERATIVE INDICATIONS:  Laura Griffith is a  79 y.o. female with a diagnosis of Left Distal Femur Fracture who failed conservative measures and elected for surgical management.    The risks benefits and alternatives were discussed with the patient preoperatively including but not limited to the risks of infection, bleeding, nerve injury, cardiopulmonary complications, the need for revision surgery, among others, and the patient was willing to proceed.  OPERATIVE IMPLANTS: Stryker supracondylar nail  OPERATIVE FINDINGS: Stable placement of nail  BLOOD LOSS: 50  COMPLICATIONS: None  TOURNIQUET TIME: None  OPERATIVE PROCEDURE:  Patient was identified in the preoperative holding area and site was marked by me He was transported to the operating theater and placed on the table in supine position taking care to pad all bony prominences. After a preincinduction time out anesthesia was induced. The Left lower extremity was prepped and draped in normal sterile fashion and a pre-incision timeout was performed. Laura Griffith received Ancef for preoperative antibiotics. Due to an error on the floor she inadvertently received her preoperative Ancef greater than 2 hours prior to surgery so I did read dosed her with another gram of Ancef just prior to incision..   I made a medial parapatellar incision and dissected down through the adipose tissue to the joint capsule which was incised just medial to the patella tendon. I then inserted a guidewire a guidewire and under fluoroscopic examination was able to place it up through the notch in the central aspect on the AP view and just at the anterior aspect of the  facets line on the lateral. As happy the displacement is used the entry reamer to create a portal into the distal femur. I inserted a slowly so as not to blow part the distal fragment. I then performed a reduction was happy with his reduction despite slight valgus elected to keep it where was. I sequentially reamed up to a 12 reamer.  I then selected an 11 x 3 40 Stryker supracondylar nail and inserted over the ball-tipped guidewire. Once it was seated appropriately on fluoroscopic views I took a proximal x-ray and confirmed that the tip of the nail was superior to the subtrochanteric region. I then used the outrigger to place 2 transverse distal interlock screws and 2 oblique screws was happy with the bite on these and the depth on fluoroscopy.  I then removed the outrigger in place the and Locking the distal interlock screw. Then turned my attention proximally elected to place the more distal interlock as the other one was in the subtrochanteric region. I used a perfect circles technique to place this proximally locked. I then took final fluoroscopic views and was happy with the reduction of the fracture and placement of all hardware.  The patient's level of obesity made the reduction significantly more difficult as well as placement of all hardware as was the dissection. The set a complexity of the case dosed much greater than non-obese person.  POST OPERATIVE PLAN: She'll received Ancef for postoperative antibiotics and be nonweightbearing on the left lower extremity.

## 2013-09-29 NOTE — Progress Notes (Signed)
I cosign for Laura Griffith's (student nurse) assessment, I&O, notes, care plan, education, and med administration. 

## 2013-09-29 NOTE — OR Nursing (Signed)
Lifts heqad off bed/shows staff member 2 fingers/follows commands/alert, oriented to place ,self

## 2013-09-29 NOTE — Progress Notes (Signed)
Family Medicine Teaching Service Daily Progress Note Intern Pager: [phone]  Patient name: Laura Griffith Medical record number: [id number] Date of birth: 09-Aug-1933 Age: 79 y.o. Gender: female  Primary Care Provider: Tawni Carnes, MD Consultants: Ortho Code Status:   Pt Overview and Major Events to Date:  11/10: Femur fx  11/11: Surgery today  Assessment and Plan: Laura Griffith is a 79 y.o. female presenting with a femur fracture. PMH is significant for pulmonary hypertension, hypertension, dCHF and atrial fibrillation   #Femur fracture:   Follow-up orthopedic recommendations for surgery   INTRAMEDULLARY (IM) RETROGRADE FEMORAL NAILING today  Type and screen done Hgb: 11, Plts 144; repeat today: Hgb 8.9 Plts 134 NPO after midnight  Fentanyl 11/10 ~ 6pm; Morphine 4mg  q 2 for pain (none required) PT/OT to eval/treat post surgery  #Atrial fibrillation: currently rate controlled.  Continue metoprolol  Given FFPs for INR of 1.99 Pt is currently NSR INR 2.0 (11/11)  Holding  warfarin   # UTI - POA on Keflex (started 11/7); CTX given in ED - Pt is afebile and asymptomatic, WBC wnl ; Keflex held for now - UA: Leu (+); Nit (-) - Culture: pending  # CKD stage 3  - Cr 1.34 on admit; Cr today 1.27  #Hypertension: currently controlled. Worried about hypotension with three BP meds  Metoprolol tartrate 37.5mg  BID   BP last 24: (93-125)/(40-89) 103/61 Holding  hctz and losartan  #Wheezing: no known history of asthma or COPD. Does not use an albuterol inhaler at home.  levalbuterol nebulizer q8hours for wheezing  Monitor for possible increase HR  FEN/GI: NPO after midnight  - D5 1/2 NS @ 100 Prophylaxis: SCDs   Disposition: On tele; dispo pending ortho recs  Subjective: She denies any Leg pain, CP, SOB or palpitations at this time. She lives alone, but family is next door. She wants to return home after surgery.   Objective: Temp:  [97.6 F (36.4 C)-99.2 F  (37.3 C)] 97.8 F (36.6 C) (11/11 0524) Pulse Rate:  [60-102] 73 (11/11 0524) Resp:  [13-27] 21 (11/11 0524) BP: (93-125)/(40-89) 103/61 mmHg (11/11 0524) SpO2:  [90 %-99 %] 92 % (11/11 0524) Weight:  [245 lb 13 oz (111.5 kg)-247 lb 12.8 oz (112.4 kg)] 245 lb 13 oz (111.5 kg) (11/11 0524) Physical Exam: General: Laying in bed in no acute distress  Cardiovascular: Regular rate/rhythm, no murmurs, rubs or gallops, distant heart sounds due to body habitus  Respiratory: good air movement, no rales  Abdomen: soft, obese, non-tender  Extremities: marked edema bilaterally LE, non-pitting, 2+ pedal pulses: sensation intact b/l  Skin: ecchymosis on left forearm from venipuncture  Neuro: Alert, oriented x3  Laboratory:  Recent Labs Lab 09/28/13 1316 09/29/13 0649  WBC 9.1 7.0  HGB 11.0* 8.9*  HCT 37.6 30.0*  PLT 144* 134*    Recent Labs Lab 09/28/13 1316 09/29/13 0649  NA 145 142  K 4.1 3.1*  CL 108 103  CO2 29 29  BUN 54* 45*  CREATININE 1.34* 1.27*  CALCIUM 10.1 9.2  GLUCOSE 114* 124*   Imaging/Diagnostic Tests: CT Knee  - There is an impacted displaced and comminuted fracture through the  distal left femur. Approximately 14 mm of overlap/impaction.  Approximately 10 mm posterior displacement. There is also mild  medial displacement of the distal fracture fragments. Moderate joint  effusion.   DG Femur IMPRESSION:  The patient has sustained an acute comminuted slightly angulated  displaced fracture of the distal left femoral metadiaphysis.  CXR IMPRESSION:  The findings suggest low-grade compensated CHF. There is no  significant interstitial or alveolar edema.   [name], MD 09/29/2013, 7:23 AM PGY-1, [facility] Family Medicine FPTS Intern pager: [phone], text pages welcome

## 2013-09-29 NOTE — Anesthesia Postprocedure Evaluation (Signed)
  Anesthesia Post-op Note  Patient: Laura Griffith  Procedure(s) Performed: Procedure(s): INTRAMEDULLARY (IM) RETROGRADE FEMORAL NAILING (Left)  Patient Location: PACU  Anesthesia Type:General  Level of Consciousness: awake, alert , oriented and patient cooperative  Airway and Oxygen Therapy: Patient Spontanous Breathing  Post-op Pain: mild  Post-op Assessment: Post-op Vital signs reviewed, Patient's Cardiovascular Status Stable, Respiratory Function Stable, Patent Airway, No signs of Nausea or vomiting and Pain level controlled  Post-op Vital Signs: stable  Complications: No apparent anesthesia complications

## 2013-09-29 NOTE — Anesthesia Preprocedure Evaluation (Addendum)
Anesthesia Evaluation  Patient identified by MRN, date of birth, ID band Patient awake    Reviewed: Allergy & Precautions, H&P , NPO status , Patient's Chart, lab work & pertinent test results  Airway Mallampati: III TM Distance: >3 FB Neck ROM: full    Dental  (+) Edentulous Upper, Edentulous Lower and Dental Advisory Given   Pulmonary shortness of breath,  Pulmonary HTN + rhonchi         Cardiovascular hypertension, + CAD, + Peripheral Vascular Disease, +CHF and DVT + dysrhythmias Atrial Fibrillation Rhythm:Irregular Rate:Normal     Neuro/Psych Depression    GI/Hepatic   Endo/Other  Morbid obesity  Renal/GU Renal InsufficiencyRenal disease     Musculoskeletal  (+) Arthritis -, Osteoarthritis,    Abdominal   Peds  Hematology  (+) anemia ,   Anesthesia Other Findings   Reproductive/Obstetrics                          Anesthesia Physical Anesthesia Plan  ASA: IV  Anesthesia Plan: General   Post-op Pain Management:    Induction: Intravenous  Airway Management Planned: Oral ETT  Additional Equipment:   Intra-op Plan:   Post-operative Plan: Extubation in OR and Possible Post-op intubation/ventilation  Informed Consent: I have reviewed the patients History and Physical, chart, labs and discussed the procedure including the risks, benefits and alternatives for the proposed anesthesia with the patient or authorized representative who has indicated his/her understanding and acceptance.     Plan Discussed with: CRNA, Anesthesiologist and Surgeon  Anesthesia Plan Comments:         Anesthesia Quick Evaluation

## 2013-09-29 NOTE — Progress Notes (Addendum)
Family Practice Teaching Service Interval Progress Note  Stopped by to check on patient around 9:30pm after her surgery today. Pt in NAD, lying in bed, awake/alert, talking. She was somewhat confused, as she told me she had not yet had surgery. RN informed me of pt's low BP, in the 90's to low 100's systolic, HR 80s-90s. Gave order to hold PM metoprolol dose.   Received call just now from RN regarding continued low BP's, some as low as 60s systolic but most recently 102 systolic once patient stimulated and asked to wake up. At that point she was talking and alert per RN.  Plan: - 500 cc bolus now (need to avoid fluid overload due to hx of CHF) - 12 lead EKG now - continue to monitor BP's closely, continue telemetry - if persistently hypotensive despite bolus, will transfer to stepdown and consider calling CCM regarding possible ICU transfer for pressors.  Levert Feinstein, MD Family Medicine PGY-2 Service Pager [phone]

## 2013-09-29 NOTE — H&P (Signed)
Attending Addendum  I examined the patient and discussed the assessment and plan with Dr. Piloto. I have reviewed the note and agree.    Juda Toepfer, MD FAMILY MEDICINE TEACHING SERVICE    

## 2013-09-29 NOTE — OR Nursing (Signed)
Extubated/placed on sm at 6 liters

## 2013-09-29 NOTE — Transfer of Care (Signed)
Immediate Anesthesia Transfer of Care Note  Patient: Laura Griffith  Procedure(s) Performed: Procedure(s): INTRAMEDULLARY (IM) RETROGRADE FEMORAL NAILING (Left)  Patient Location: PACU  Anesthesia Type:General  Level of Consciousness: awake  Airway & Oxygen Therapy: Patient remains intubated per anesthesia plan and Patient placed on Ventilator (see vital sign flow sheet for setting)  Post-op Assessment: Report given to PACU RN and Post -op Vital signs reviewed and stable  Post vital signs: Reviewed  Complications: No apparent anesthesia complications

## 2013-09-29 NOTE — Progress Notes (Signed)
Patient remains hypotensive.  MD on call made aware. EKG done and 500 ml bolus given x1.  RN will continue to monitor and inform MD. Louretta Parma, RN

## 2013-09-29 NOTE — Progress Notes (Signed)
Pt SBP 90-100, last 102/64, HR 80-95 in afib. MD on call notified and ordered to hold metoprolol this PM. Baron Hamper, RN

## 2013-09-29 NOTE — Progress Notes (Signed)
Attending Addendum  I discussed the assessment and plan with Dr. Gayla Doss. I have reviewed the note and agree.    Dessa Phi, MD FAMILY MEDICINE TEACHING SERVICE

## 2013-09-29 NOTE — Anesthesia Procedure Notes (Signed)
Procedure Name: Intubation Date/Time: 09/29/2013 3:16 PM Performed by: Lovie Chol Pre-anesthesia Checklist: Patient identified, Emergency Drugs available, Suction available and Timeout performed Patient Re-evaluated:Patient Re-evaluated prior to inductionOxygen Delivery Method: Circle system utilized Preoxygenation: Pre-oxygenation with 100% oxygen Intubation Type: IV induction Ventilation: Mask ventilation without difficulty Laryngoscope Size: Miller and 2 Grade View: Grade I Tube type: Oral Tube size: 7.5 mm Number of attempts: 1 Airway Equipment and Method: Stylet Placement Confirmation: ETT inserted through vocal cords under direct vision,  positive ETCO2,  CO2 detector and breath sounds checked- equal and bilateral Secured at: 22 cm Tube secured with: Tape Dental Injury: Teeth and Oropharynx as per pre-operative assessment

## 2013-09-29 NOTE — H&P (View-Only) (Signed)
   ORTHOPAEDIC CONSULTATION  REQUESTING PHYSICIAN: Martha K Linker, MD  Chief Complaint: Left distal femur fracture.   HPI: Laura Griffith is a 79 y.o. female who complains of  Fall onto L leg  Past Medical History  Diagnosis Date  . Obesity   . Depression   . HTN (hypertension)   . Hyperlipemia   . Renal insufficiency     baseline 1.6-1.8 - Cr 0.7 (`00) 1.6  8/02  1.5 (2/03), Cr. 0.9  8/03 1.1 (11/05), Creatinine Clearance 8/02 about 50 ml/min MSE:  30/30 (09/2006)  . Aortic aneurysm     Followed by Dr. Hendrickson.  CT chest (3/11) with stable 4.8 cm ascending  aortic aneurysm.  . Atrial fibrillation     Noted initially at adenosine myoview 8/10.  She seems to be in chronic atrial fibrillation  at this point.  . Osteoarthritis   . Embolism - blood clot     insertion of vena cava filter   Past Surgical History  Procedure Laterality Date  . Cataract extraction    . Cholecystectomy    . Hernia repair    . Hysterotomy    . Osteotomy     History   Social History  . Marital Status: Widowed    Spouse Name: N/A    Number of Children: N/A  . Years of Education: N/A   Social History Main Topics  . Smoking status: Never Smoker   . Smokeless tobacco: Never Used  . Alcohol Use: No  . Drug Use: No  . Sexual Activity: None   Other Topics Concern  . None   Social History Narrative   Lives alone. She has no problems grooming, bathing herself, getting around the house. She likes to read and reads the newspaper daily.    Children live within 1 mile of her & very supportive and involved in her care. Her children buy groceries and sort out her medications for her which she is able to take by herself.    No EtOH, no tobacco.     Widowed.   Walks with walker all the time.                Family History  Problem Relation Age of Onset  . Breast cancer Mother   . Heart disease Sister   . Heart disease Brother    Allergies  Allergen Reactions  . Sulfamethoxazole Hives     Prior to Admission medications   Medication Sig Start Date End Date Taking? Authorizing Provider  Calcium Carbonate-Vitamin D (CALCIUM-VITAMIN D) 500-200 MG-UNIT per tablet Take 1 tablet by mouth 2 (two) times daily with a meal. 05/12/12  Yes Angela J Oh Park, MD  cephALEXin (KEFLEX) 500 MG capsule Take 1 capsule (500 mg total) by mouth 3 (three) times daily. 09/25/13  Yes Andrew Wight, MD  fish oil-omega-3 fatty acids 1000 MG capsule Take 2 capsules (2 g total) by mouth daily. 05/12/12  Yes Angela J Oh Park, MD  hydrochlorothiazide (HYDRODIURIL) 25 MG tablet Take 25 mg by mouth daily.   Yes Historical Provider, MD  losartan (COZAAR) 100 MG tablet Take 100 mg by mouth daily.   Yes Historical Provider, MD  metoprolol succinate (TOPROL-XL) 50 MG 24 hr tablet Take 75 mg by mouth daily. Take with or immediately following a meal.   Yes Historical Provider, MD  oxybutynin (DITROPAN-XL) 5 MG 24 hr tablet Take 5 mg by mouth daily.   Yes Historical Provider, MD  rosuvastatin (CRESTOR) 10 MG   tablet Take 10 mg by mouth daily.   Yes Historical Provider, MD  sertraline (ZOLOFT) 100 MG tablet Take 100 mg by mouth daily.   Yes Historical Provider, MD  traMADol (ULTRAM) 50 MG tablet Take 100 mg by mouth every 12 (twelve) hours as needed for moderate pain or severe pain.   Yes Historical Provider, MD  vitamin B-12 (CYANOCOBALAMIN) 1000 MCG tablet Take 1 tablet (1,000 mcg total) by mouth daily. 01/05/13  Yes Angela J Oh Park, MD  warfarin (COUMADIN) 1 MG tablet Take 1-2 mg by mouth daily at 6 PM. Pt takes 2 mg every day except 1 mg on wed, fri   Yes Historical Provider, MD   Dg Chest 2 View  09/28/2013   CLINICAL DATA:  Cough status post fall.  EXAM: CHEST  2 VIEW  COMPARISON:  None.  FINDINGS: The cardiac silhouette is enlarged. The pulmonary vascularity is mildly prominent centrally but there is no definite cephalization of flow. There is tortuosity of the descending thoracic aorta. No pleural effusion is evident.  There is no pneumothorax nor pneumomediastinum. There are degenerative changes of both shoulders.  IMPRESSION: The findings suggest low-grade compensated CHF. There is no significant interstitial or alveolar edema.   Electronically Signed   By: David  Jordan   On: 09/28/2013 11:35   Dg Femur Left  09/28/2013   CLINICAL DATA:  Pain status post fall.  EXAM: LEFT FEMUR - 2 VIEW  COMPARISON:  None.  FINDINGS: The patient has sustained an acute comminuted displaced fracture of the distal left femoral metadiaphysis. The femoral shaft is laterally positioned with respect to the distal femoral metaphysis. More proximally the femoral shaft and the visualized for portions of the femoral head and neck and intertrochanteric regions are grossly normal. There is high-grade narrowing of the medial and lateral joint compartments of the left knee. The patient has undergone previous fixation of a lateral tibial plateau fracture.  IMPRESSION: The patient has sustained an acute comminuted slightly angulated displaced fracture of the distal left femoral metadiaphysis.   Electronically Signed   By: David  Jordan   On: 09/28/2013 11:41    Positive ROS: All other systems have been reviewed and were otherwise negative with the exception of those mentioned in the HPI and as above.  Labs cbc  Recent Labs  09/28/13 1316  WBC 9.1  HGB 11.0*  HCT 37.6  PLT 144*    Labs inflam No results found for this basename: ESR, CRP,  in the last 72 hours  Labs coag  Recent Labs  09/28/13 1334  INR 1.99*     Recent Labs  09/28/13 1316  NA 145  K 4.1  CL 108  CO2 29  GLUCOSE 114*  BUN 54*  CREATININE 1.34*  CALCIUM 10.1    Physical Exam: Filed Vitals:   09/28/13 1430  BP: 108/69  Pulse: 81  Temp:   Resp: 27   General: Alert, no acute distress Cardiovascular: No pedal edema Respiratory: No cyanosis, no use of accessory musculature GI: No organomegaly, abdomen is soft and non-tender Skin: No lesions in  the area of chief complaint Neurologic: Sensation intact distally Psychiatric: Patient is competent for consent with normal mood and affect Lymphatic: No axillary or cervical lymphadenopathy  MUSCULOSKELETAL:  Pain and swelling at the left leg. Distally NVI Other extremities are atraumatic with painless ROM and NVI.  Assessment: L supracondylar femur fracture  Plan: Plan for fixation in OR Tuesday FFP Weight Bearing Status: NWB PT VTE px:   SCD's and chemical post op   Yelitza Reach, D, MD Cell [phone]   09/28/2013 3:21 PM     

## 2013-09-29 NOTE — Preoperative (Signed)
Beta Blockers   Reason not to administer Beta Blockers:Not Applicable 

## 2013-09-30 ENCOUNTER — Inpatient Hospital Stay (HOSPITAL_COMMUNITY): Payer: Medicare HMO

## 2013-09-30 DIAGNOSIS — I712 Thoracic aortic aneurysm, without rupture: Secondary | ICD-10-CM

## 2013-09-30 LAB — PREPARE RBC (CROSSMATCH)

## 2013-09-30 LAB — CBC
HCT: 24.9 % — ABNORMAL LOW (ref 36.0–46.0)
HCT: 24.3 % — ABNORMAL LOW (ref 36.0–46.0)
Hemoglobin: 7.5 g/dL — ABNORMAL LOW (ref 12.0–15.0)
MCH: 26 pg (ref 26.0–34.0)
MCH: 26.8 pg (ref 26.0–34.0)
MCHC: 29.7 g/dL — ABNORMAL LOW (ref 30.0–36.0)
MCV: 86.8 fL (ref 78.0–100.0)
Platelets: 110 10*3/uL — ABNORMAL LOW (ref 150–400)
Platelets: 91 10*3/uL — ABNORMAL LOW (ref 150–400)
RBC: 2.85 MIL/uL — ABNORMAL LOW (ref 3.87–5.11)
RBC: 2.8 MIL/uL — ABNORMAL LOW (ref 3.87–5.11)
WBC: 7.3 10*3/uL (ref 4.0–10.5)

## 2013-09-30 LAB — PREPARE FRESH FROZEN PLASMA: Unit division: 0

## 2013-09-30 LAB — BASIC METABOLIC PANEL
CO2: 31 mEq/L (ref 19–32)
Calcium: 8.5 mg/dL (ref 8.4–10.5)
Creatinine, Ser: 1.42 mg/dL — ABNORMAL HIGH (ref 0.50–1.10)
GFR calc non Af Amer: 34 mL/min — ABNORMAL LOW (ref >90)
Potassium: 3.5 mEq/L (ref 3.5–5.1)
Sodium: 142 mEq/L (ref 135–145)

## 2013-09-30 LAB — PROTIME-INR
INR: 2.05 — ABNORMAL HIGH (ref 0.00–1.49)
Prothrombin Time: 22.5 seconds — ABNORMAL HIGH (ref 11.6–15.2)

## 2013-09-30 LAB — GLUCOSE, CAPILLARY

## 2013-09-30 LAB — URINE CULTURE: Culture: >=100000

## 2013-09-30 MED ORDER — CEPHALEXIN 500 MG PO CAPS
500.0000 mg | ORAL_CAPSULE | Freq: Four times a day (QID) | ORAL | Status: DC
  Filled 2013-09-30 (×3): qty 1

## 2013-09-30 MED ORDER — SODIUM CHLORIDE 0.9 % IV BOLUS (SEPSIS)
750.0000 mL | Freq: Once | INTRAVENOUS | Status: AC
  Administered 2013-09-30: 750 mL via INTRAVENOUS

## 2013-09-30 MED ORDER — ACETAMINOPHEN 325 MG PO TABS
650.0000 mg | ORAL_TABLET | Freq: Four times a day (QID) | ORAL | Status: DC
  Administered 2013-09-30 – 2013-10-06 (×24): 650 mg via ORAL
  Filled 2013-09-30 (×22): qty 2

## 2013-09-30 MED ORDER — HYDROCODONE-ACETAMINOPHEN 5-325 MG PO TABS
1.0000 | ORAL_TABLET | Freq: Four times a day (QID) | ORAL | Status: DC | PRN
  Administered 2013-09-30 – 2013-10-01 (×2): 1 via ORAL
  Filled 2013-09-30 (×2): qty 1

## 2013-09-30 MED ORDER — DIAZEPAM 5 MG PO TABS: 5.0000 mg | ORAL_TABLET | Freq: Once | ORAL | Status: DC

## 2013-09-30 MED ORDER — LEVOFLOXACIN 500 MG PO TABS
500.0000 mg | ORAL_TABLET | Freq: Once | ORAL | Status: AC
  Administered 2013-09-30: 500 mg via ORAL
  Filled 2013-09-30: qty 1

## 2013-09-30 MED ORDER — CEPHALEXIN 500 MG PO CAPS
500.0000 mg | ORAL_CAPSULE | Freq: Two times a day (BID) | ORAL | Status: DC
  Filled 2013-09-30: qty 1

## 2013-09-30 MED ORDER — LEVOFLOXACIN 250 MG PO TABS
250.0000 mg | ORAL_TABLET | Freq: Every day | ORAL | Status: AC
  Administered 2013-10-01 – 2013-10-04 (×4): 250 mg via ORAL
  Filled 2013-09-30 (×4): qty 1

## 2013-09-30 MED ORDER — SODIUM CHLORIDE 0.9 % IV BOLUS (SEPSIS)
500.0000 mL | Freq: Once | INTRAVENOUS | Status: AC
  Administered 2013-09-30: 500 mL via INTRAVENOUS

## 2013-09-30 NOTE — Progress Notes (Signed)
Utilization review completed.  

## 2013-09-30 NOTE — Progress Notes (Signed)
Subjective:  Patient reports pain as mild, She seems to have some disorientation.   Objective:   VITALS:   Filed Vitals:   09/30/13 0600 09/30/13 0730 09/30/13 0800 09/30/13 1000  BP: 110/60 128/67    Pulse:  100  106  Temp:  98.7 F (37.1 C)  98.8 F (37.1 C)  TempSrc:  Oral  Oral  Resp:  24 18 27   Height:      Weight:      SpO2:  98% 98% 97%    Physical Exam Some confusion of circumstances but she is able to recall her injury and is alert and oriented to person, place, date Face is symetric  Dressing: C/D/I  Compartments soft  SILT DP/SP/S/S/T, 2+DP, +TA/GS/EHL  LABS  Results for orders placed during the hospital encounter of 09/28/13 (from the past 24 hour(s))  PREPARE RBC (CROSSMATCH)     Status: None   Collection Time    09/29/13  1:52 PM      Result Value Range   Order Confirmation ORDER PROCESSED BY BLOOD BANK    PREPARE FRESH FROZEN PLASMA     Status: None   Collection Time    09/29/13  1:53 PM      Result Value Range   Unit Number [id number]     Blood Component Type THAWED PLASMA     Unit division 00     Status of Unit ISSUED     Transfusion Status OK TO TRANSFUSE     Unit Number [id number]     Blood Component Type THAWED PLASMA     Unit division 00     Status of Unit ALLOCATED     Transfusion Status OK TO TRANSFUSE    BASIC METABOLIC PANEL     Status: Abnormal   Collection Time    09/29/13  9:27 PM      Result Value Range   Sodium 143  135 - 145 mEq/L   Potassium 3.6  3.5 - 5.1 mEq/L   Chloride 103  96 - 112 mEq/L   CO2 32  19 - 32 mEq/L   Glucose, Bld 116 (*) 70 - 99 mg/dL   BUN 39 (*) 6 - 23 mg/dL   Creatinine, Ser 5.63 (*) 0.50 - 1.10 mg/dL   Calcium 8.7  8.4 - 87.5 mg/dL   GFR calc non Af Amer 39 (*) >90 mL/min   GFR calc Af Amer 45 (*) >90 mL/min  GLUCOSE, CAPILLARY     Status: Abnormal   Collection Time    09/30/13  1:49 AM      Result Value Range   Glucose-Capillary 131 (*) 70 - 99 mg/dL   Comment 1 Notify RN     Comment 2 Documented in Chart    CBC     Status: Abnormal   Collection Time    09/30/13  4:26 AM      Result Value Range   WBC 9.2  4.0 - 10.5 K/uL   RBC 2.85 (*) 3.87 - 5.11 MIL/uL   Hemoglobin 7.4 (*) 12.0 - 15.0 g/dL   HCT 64.3 (*) 32.9 - 51.8 %   MCV 87.4  78.0 - 100.0 fL   MCH 26.0  26.0 - 34.0 pg   MCHC 29.7 (*) 30.0 - 36.0 g/dL   RDW 84.1 (*) 66.0 - 63.0 %   Platelets 110 (*) 150 - 400 K/uL  BASIC METABOLIC PANEL     Status: Abnormal   Collection Time  09/30/13  4:26 AM      Result Value Range   Sodium 142  135 - 145 mEq/L   Potassium 3.5  3.5 - 5.1 mEq/L   Chloride 104  96 - 112 mEq/L   CO2 31  19 - 32 mEq/L   Glucose, Bld 109 (*) 70 - 99 mg/dL   BUN 38 (*) 6 - 23 mg/dL   Creatinine, Ser 1.19 (*) 0.50 - 1.10 mg/dL   Calcium 8.5  8.4 - 14.7 mg/dL   GFR calc non Af Amer 34 (*) >90 mL/min   GFR calc Af Amer 40 (*) >90 mL/min  PROTIME-INR     Status: Abnormal   Collection Time    09/30/13  4:26 AM      Result Value Range   Prothrombin Time 22.5 (*) 11.6 - 15.2 seconds   INR 2.05 (*) 0.00 - 1.49  PREPARE RBC (CROSSMATCH)     Status: None   Collection Time    09/30/13  8:19 AM      Result Value Range   Order Confirmation BB SAMPLE OR UNITS ALREADY AVAILABLE       Assessment/Plan: 1 Day Post-Op   Active Problems:   * No active hospital problems. *   PLAN: Weight Bearing: NWB LLE Dressings: change prior to d/c VTE prophylaxis: SCD's, ASA 325 Consider transfusion Monitor mental status, notified primary team Dispo: SNF likely   Margarita Rana, D 09/30/2013, 10:11 AM   Margarita Rana, MD Cell [phone]

## 2013-09-30 NOTE — Clinical Documentation Improvement (Signed)
THIS DOCUMENT IS NOT A PERMANENT PART OF THE MEDICAL RECORD  Please update your documentation with the medical record to reflect your response to this query. If you need help knowing how to do this please call [phone].  09/30/13  Dr. [name],  In an effort to better capture your patient's severity of illness, reflect appropriate length of stay and utilization of resources, a review of the patient medical record has revealed the following information:   - s/p IM Nailing of Femur Fracture   - Known history of Anemia preoperatively   - Multiple fluid boluses postoperatively for hypotension   - 1 unit PCs ordered 09/30/13   - Admission H&H  11.0/37.6   - Lowest Postop H&H  7.4/24.9   Based on your clinical judgment, please document in the progress notes and discharge summary if a condition below provides greater specificity regarding the patient's anemia:   - Acute Blood Loss Anemia   - Combined Acute Blood Loss and Dilutional Anemia   - Other Condition   - Unable to Clinically Determine   In responding to this query please exercise your independent judgment.    The fact that a query is asked, does not imply that any particular answer is desired or expected.  Reviewed: additional documentation in the medical record  Thank You,  Jerral Ralph  RN BSN CCDS Certified Clinical Documentation Specialist: [phone] Health Information Management [place]

## 2013-09-30 NOTE — Progress Notes (Signed)
Patient remains hypotensive.  Bolus x1 given. Lowanda Foster, MD on call notified. Orders to transfer to stepdown placed by MD. RN will continue to monitor. Louretta Parma, RN

## 2013-09-30 NOTE — Progress Notes (Signed)
Family Practice Teaching Service Interval Progress Note  Went to see patient in stepdown unit to see how she is doing. She was alert and awake, talking loudly. Oriented to person, month, year, but adamantly states that she is at home and that all the clinical staff are "liars". Denies chest pain or SOB. Breath sounds clear bilaterally. L leg wrapped post-operatively, R leg with ted hose and SCD in place. BP's have improved overnight, with systolics in the 90s-110's per RN.   Will continue to monitor in stepdown unit and provide frequent reorientation given postop delirium.  Levert Feinstein, MD Family Medicine PGY-2 Service Pager [phone]

## 2013-09-30 NOTE — Progress Notes (Signed)
Occupational therapy agrees with the above note and will check back again tomorrow to see if pt is appropriate.

## 2013-09-30 NOTE — Progress Notes (Signed)
Family Medicine Teaching Service Daily Progress Note Intern Pager: [phone]  Patient name: Laura Griffith Medical record number: [id number] Date of birth: 05-10-1933 Age: 79 y.o. Gender: female  Primary Care Provider: Tawni Carnes, MD Consultants: Ortho Code Status:   Pt Overview and Major Events to Date:  11/10: Femur fx  11/11: Surgery today  Assessment and Plan: [name] is a 79 y.o. female presenting with a femur fracture. PMH is significant for pulmonary hypertension, hypertension, dCHF and atrial fibrillation   #Femur fracture:   Follow-up orthopedic recommendations for surgery   INTRAMEDULLARY (IM) RETROGRADE FEMORAL NAILING today  Type and screen done Hgb: 7.4, Plts 110; transuse 1u today and recheck Advancing diet Pain control: tylenol scheduled; Vicodin prn PT/OT to eval/treat post surgery  # Delirium - A&Ox4 this morning - Continue to assess; treat pain and orient often  #Atrial fibrillation: currently rate controlled.  Continue metoprolol  Given FFPs for INR of 1.99 Pt is currently NSR, but tachycardic INR 2.05 (11/12)  Restart warfarin   # UTI - POA on Keflex (started 11/7); CTX given in ED - UA: Leu (+); Nit (-) - Culture: > 100,000 Gram - rogs - Pt is afebile and asymptomatic, WBC wnl ; Keflex held for now  # CKD stage 3  - Cr 1.34 on admit; Cr today 1.42 - Rehydrate and monitor  #Hypertension: currently controlled. Worried about hypotension with three BP meds  Metoprolol tartrate 37.5mg  BID  BP last 24: ((69-128)/(38-84) 128/67 mmHg  Holding  hctz and losartan  #Wheezing: no known history of asthma or COPD. Does not use an albuterol inhaler at home.  levalbuterol nebulizer q8hours for wheezing  Monitor for possible increase HR  FEN/GI: advance diet - D5 1/2 NS @ 100 Prophylaxis: Warfarin  Disposition: On tele; dispo pending ortho recs  Subjective: She denies any Leg pain, CP, SOB or palpitations at this time. She agrees to RBC  transfusion. Denies any UTI symptoms.  Objective: Temp:  [97 F (36.1 C)-99.8 F (37.7 C)] 99.1 F (37.3 C) (11/12 0400) Pulse Rate:  [74-112] 100 (11/12 0730) Resp:  [18-29] 18 (11/12 0800) BP: (69-128)/(38-84) 128/67 mmHg (11/12 0730) SpO2:  [92 %-100 %] 98 % (11/12 0800) FiO2 (%):  [100 %] 100 % (11/11 1700) Physical Exam: General: Laying in bed in no acute distress  Cardiovascular: Regular rate/rhythm, no murmurs, rubs or gallops, distant heart sounds due to body habitus  Respiratory: Mild Wheeze. good air movement, no rales  Abdomen: soft, obese, non-tender  Extremities: moderate  edema bilaterally LE, non-pitting, 2+ pedal pulses: sensation intact b/l  Skin: ecchymosis on left forearm from venipuncture  Neuro: Alert, oriented x4  Laboratory:  Recent Labs Lab 09/28/13 1316 09/29/13 0649 09/30/13 0426  WBC 9.1 7.0 9.2  HGB 11.0* 8.9* 7.4*  HCT 37.6 30.0* 24.9*  PLT 144* 134* 110*    Recent Labs Lab 09/29/13 0649 09/29/13 2127 09/30/13 0426  NA 142 143 142  K 3.1* 3.6 3.5  CL 103 103 104  CO2 29 32 31  BUN 45* 39* 38*  CREATININE 1.27* 1.29* 1.42*  CALCIUM 9.2 8.7 8.5  GLUCOSE 124* 116* 109*   Imaging/Diagnostic Tests: CT Knee  - There is an impacted displaced and comminuted fracture through the  distal left femur. Approximately 14 mm of overlap/impaction.  Approximately 10 mm posterior displacement. There is also mild  medial displacement of the distal fracture fragments. Moderate joint  effusion.   DG Femur IMPRESSION:  The patient has sustained an acute comminuted  slightly angulated  displaced fracture of the distal left femoral metadiaphysis.   CXR IMPRESSION:  The findings suggest low-grade compensated CHF. There is no  significant interstitial or alveolar edema.   [name], MD 09/30/2013, 8:43 AM PGY-1, [place] [facility] FPTS Intern pager: [phone], text pages welcome

## 2013-09-30 NOTE — Progress Notes (Addendum)
Patient returned to unit from procedure. Dorsalis pulse in left foot is a +2. No signs or symptoms of bleeding. Leg is warm to touch. Will continue to monitor patient for further changes in condition.

## 2013-09-30 NOTE — Progress Notes (Signed)
BP 89/47, HR low 100s in a-fib. MD notified. New orders received. MD to the bedside to assess patient. Will continue to monitor.   Rochele Pages, RN

## 2013-09-30 NOTE — Progress Notes (Signed)
Pt is now disoriented and irritable. MD at bedside. Will continue to monitor.

## 2013-09-30 NOTE — Progress Notes (Addendum)
Attending Addendum  I examined the patient and discussed the assessment and plan with Dr. Gayla Doss. I have reviewed the note and agree.  79 yo patient with known atrial fibrillation on coumadin and  ascending aortic aneurysm POD #1 for L femur fracture repair.   1. She had low post op BP. This improved with IVs give overnight. I discussed her care with her surgeon, Dr. Eulah Pont, who requested that she receive 1 U PRBC for down trending Hgb, this was ordered. Post transfusion CBC and proBNP pending. Also ordered repeat ECHO as patient has cardiomegaly on CXR, known ascending aortic aneurysm, concern is ischemic cardiomyopathy. Her last ECHO was one year ago with EF 50-55%, no diastolic dysfunction.    2. She  had some post op/step down delirium this AM that improved over with time. She was alert and calm during by exam this AM. Denied pain. Decrease prn narcotic dose and scheduled tylenol for pain control.     Dessa Phi, MD FAMILY MEDICINE TEACHING SERVICE

## 2013-09-30 NOTE — Progress Notes (Signed)
Pt notified of transfer to stepdown. Pt refuses for RN to call family to notify family. Report attempted x1. Will continue to monitor. Baron Hamper, RN

## 2013-09-30 NOTE — Progress Notes (Signed)
Clinical Social Work Department BRIEF PSYCHOSOCIAL ASSESSMENT 09/30/2013  Patient:  [facility]     Account Number:  [id number]     Admit date:  09/28/2013  Clinical Social Worker:  Leron Croak, CLINICAL SOCIAL WORKER  Date/Time:  09/30/2013 02:26 PM  Referred by:  Physician  Date Referred:  09/30/2013 Referred for  SNF Placement   Other Referral:   Interview type:  Patient Other interview type:   Pt's family was also present in the room.    PSYCHOSOCIAL DATA Living Status:  ALONE Admitted from facility:   Level of care:   Primary support name:  Johnsie Cancel  [phone] Primary support relationship to patient:  CHILD, ADULT Degree of support available:   Pt has a good support system (8 children).    CURRENT CONCERNS Current Concerns  Post-Acute Placement   Other Concerns:    SOCIAL WORK ASSESSMENT / PLAN CSW met with the Pt and family at the bedside. Pt was has difficulty at the time (coughing and breathing concerns) but was agreeable to meet with the CSW briefly. CSW introduced self and the reason for the visit. Pt was aware that she would be seeing PT/OT but not familiar with the level of care they may recommend. CSW explained the SNF process and that CSW received a consult that Pt may need rehab services at the time of d/c. Pt was agreeable to searching in the [facility] area to see if there are beds available, however was very skeptical about whether she would be willing to go to a SNF at d/c. CSW encouraged the Pt to allow CSW to complete a SNF search in the event that Pt would need those services at time of d/c. Pt was agreeable. CSW will complete a SNF search in the local area [facility]) and followup with the Pt and daughter for facility choice.   Assessment/plan status:  Information/Referral to [facility] Other assessment/ plan:   Information/referral to community resources:   CSW provided family with the SNF listing for the [facility]  area and Pt will look over options when her daughter Despina Arias) comes in to visit.    PATIENT'S/FAMILY'S RESPONSE TO PLAN OF CARE: Pt and family appreciative for assistance with d/c planning.       Leron Croak LCSWA  [facility]

## 2013-09-30 NOTE — Progress Notes (Signed)
PT/OT Cancellation Note  Patient Details Name: Laura Griffith MRN: [id number] DOB: July 11, 1933   Cancelled Treatment:    Reason Eval/Treat Not Completed: Medical issues which prohibited therapy. RN asked to hold due to pt with low BP and receiving blood at this time. PT to return as able.   Marcene Brawn 09/30/2013, 11:55 AM

## 2013-09-30 NOTE — Progress Notes (Signed)
Pt refusing X-ray of leg at this time, states she will go in AM. Baron Hamper, RN

## 2013-09-30 NOTE — Progress Notes (Signed)
Family Practice Teaching Service Interval Progress Note  BP remains low despite 500 cc bolus but patient is still alert per RN. I have discussed pt with Dr. Darrick Penna of [facility], who recommends continued IV boluses (up to 2-3 liters) for hypotension. EKG remains unchanged from prior. Will transfer patient to stepdown for closer monitoring. I have also ordered a 750 cc bolus for now. Will continue to monitor BP's closely.  Levert Feinstein, MD Family Medicine PGY-2 Service Pager [phone]

## 2013-09-30 NOTE — Progress Notes (Signed)
Pt admitted to room 3S01 with belongings. VSS. Pt is A &O X4. VSS. ELINK and CCMD notified.

## 2013-09-30 NOTE — Care Management Note (Unsigned)
    Page 1 of 1   10/02/2013     3:16:49 PM   CARE MANAGEMENT NOTE 10/02/2013  Patient:  [facility]   Account Number:  [id number]  Date Initiated:  09/30/2013  Documentation initiated by:  Donn Pierini  Subjective/Objective Assessment:   Pt admitted after fall and fx of distal femur s/p nailing with post op drop in BP-     Action/Plan:   PTA pt lived at home alone= PT/OT evals pending - pt most likely will need ST-SNF- will follow for recommendations   Anticipated DC Date:  10/02/2013   Anticipated DC Plan:  SKILLED NURSING FACILITY  In-house referral  Clinical Social Worker      DC Planning Services  CM consult      Choice offered to / List presented to:             Status of service:  In process, will continue to follow Medicare Important Message given?   (If response is "NO", the following Medicare IM given date fields will be blank) Date Medicare IM given:   Date Additional Medicare IM given:    Discharge Disposition:    Per UR Regulation:  Reviewed for med. necessity/level of care/duration of stay  If discussed at Long Length of Stay Meetings, dates discussed:    Comments:  10-02-13 1514 Tomi Bamberger, RN,BSN [phone] Pt admitted after fall and fx of distal femur s/p nailing with post op drop in BP. Pt continues on Iv lasix.  She remains volume overloaded on exam. Exploring SNF for disposition.  10/01/13- 1400- Donn Pierini RN,, BSN [phone] PT/OT recommending SNF- CSW following for placement needs.

## 2013-10-01 ENCOUNTER — Encounter (HOSPITAL_COMMUNITY): Payer: Self-pay | Admitting: Nurse Practitioner

## 2013-10-01 ENCOUNTER — Inpatient Hospital Stay (HOSPITAL_COMMUNITY): Payer: Medicare HMO

## 2013-10-01 DIAGNOSIS — I959 Hypotension, unspecified: Secondary | ICD-10-CM

## 2013-10-01 DIAGNOSIS — J810 Acute pulmonary edema: Secondary | ICD-10-CM

## 2013-10-01 DIAGNOSIS — I5032 Chronic diastolic (congestive) heart failure: Secondary | ICD-10-CM

## 2013-10-01 DIAGNOSIS — E8779 Other fluid overload: Secondary | ICD-10-CM

## 2013-10-01 DIAGNOSIS — I5033 Acute on chronic diastolic (congestive) heart failure: Secondary | ICD-10-CM

## 2013-10-01 DIAGNOSIS — D649 Anemia, unspecified: Secondary | ICD-10-CM

## 2013-10-01 DIAGNOSIS — I509 Heart failure, unspecified: Secondary | ICD-10-CM

## 2013-10-01 DIAGNOSIS — S7292XA Unspecified fracture of left femur, initial encounter for closed fracture: Secondary | ICD-10-CM

## 2013-10-01 DIAGNOSIS — I4891 Unspecified atrial fibrillation: Secondary | ICD-10-CM

## 2013-10-01 LAB — TYPE AND SCREEN
Unit division: 0
Unit division: 0

## 2013-10-01 LAB — CBC
HCT: 28.8 % — ABNORMAL LOW (ref 36.0–46.0)
HCT: 27.6 % — ABNORMAL LOW (ref 36.0–46.0)
Hemoglobin: 8.6 g/dL — ABNORMAL LOW (ref 12.0–15.0)
Hemoglobin: 8.4 g/dL — ABNORMAL LOW (ref 12.0–15.0)
MCH: 26.6 pg (ref 26.0–34.0)
MCH: 26.5 pg (ref 26.0–34.0)
MCV: 87.1 fL (ref 78.0–100.0)
Platelets: 85 10*3/uL — ABNORMAL LOW (ref 150–400)
RBC: 3.12 MIL/uL — ABNORMAL LOW (ref 3.87–5.11)
RBC: 3.28 MIL/uL — ABNORMAL LOW (ref 3.87–5.11)
RBC: 3.17 MIL/uL — ABNORMAL LOW (ref 3.87–5.11)
RDW: 18.1 % — ABNORMAL HIGH (ref 11.5–15.5)
WBC: 8 10*3/uL (ref 4.0–10.5)
WBC: 8.1 10*3/uL (ref 4.0–10.5)
WBC: 6.8 10*3/uL (ref 4.0–10.5)

## 2013-10-01 LAB — LACTIC ACID, PLASMA: Lactic Acid, Venous: 2 mmol/L (ref 0.5–2.2)

## 2013-10-01 LAB — HEPATIC FUNCTION PANEL
ALT: <5 U/L (ref 0–35)
AST: 20 U/L (ref 0–37)
Albumin: 2.3 g/dL — ABNORMAL LOW (ref 3.5–5.2)
Alkaline Phosphatase: 51 U/L (ref 39–117)
Total Bilirubin: 0.4 mg/dL (ref 0.3–1.2)
Total Protein: 5.6 g/dL — ABNORMAL LOW (ref 6.0–8.3)

## 2013-10-01 LAB — BASIC METABOLIC PANEL
BUN: 34 mg/dL — ABNORMAL HIGH (ref 6–23)
CO2: 28 mEq/L (ref 19–32)
Chloride: 101 mEq/L (ref 96–112)
GFR calc Af Amer: 45 mL/min — ABNORMAL LOW (ref >90)
Potassium: 3.5 mEq/L (ref 3.5–5.1)

## 2013-10-01 LAB — PROTIME-INR: Prothrombin Time: 24.3 seconds — ABNORMAL HIGH (ref 11.6–15.2)

## 2013-10-01 LAB — TROPONIN I: Troponin I: <0.3 ng/mL (ref <0.30)

## 2013-10-01 MED ORDER — FUROSEMIDE 10 MG/ML IJ SOLN
40.0000 mg | Freq: Two times a day (BID) | INTRAMUSCULAR | Status: DC
  Administered 2013-10-02: 40 mg via INTRAVENOUS
  Filled 2013-10-01 (×3): qty 4

## 2013-10-01 MED ORDER — PHYTONADIONE 5 MG PO TABS
5.0000 mg | ORAL_TABLET | Freq: Once | ORAL | Status: AC
  Administered 2013-10-01: 5 mg via ORAL
  Filled 2013-10-01: qty 1

## 2013-10-01 MED ORDER — METOPROLOL TARTRATE 12.5 MG HALF TABLET
12.5000 mg | ORAL_TABLET | Freq: Two times a day (BID) | ORAL | Status: DC
  Administered 2013-10-01 – 2013-10-02 (×3): 12.5 mg via ORAL
  Filled 2013-10-01 (×4): qty 1

## 2013-10-01 MED ORDER — LEVALBUTEROL HCL 0.63 MG/3ML IN NEBU: 0.6300 mg | INHALATION_SOLUTION | RESPIRATORY_TRACT | Status: DC | PRN

## 2013-10-01 MED ORDER — FUROSEMIDE 10 MG/ML IJ SOLN
60.0000 mg | Freq: Once | INTRAMUSCULAR | Status: AC
  Administered 2013-10-01: 60 mg via INTRAVENOUS
  Filled 2013-10-01: qty 6

## 2013-10-01 NOTE — Evaluation (Signed)
Occupational Therapy Evaluation Patient Details Name: Laura Griffith MRN: [id number] DOB: Jul 10, 1933 Today's Date: 10/01/2013 Time: 7829-5621 OT Time Calculation (min): 40 min  OT Assessment / Plan / Recommendation History of present illness Pt admitted after fall and fx of distal femur s/p nailing with post op drop in BP.   Clinical Impression   Pt admitted with above. Will continue to follow acutely in order to address below problem list. Recommending SNF for d/c planning.   OT Assessment  Patient needs continued OT Services    Follow Up Recommendations  Supervision/Assistance - 24 hour;SNF    Barriers to Discharge      Equipment Recommendations   (TBD)    Recommendations for Other Services    Frequency  Min 2X/week    Precautions / Restrictions Precautions Precautions: Fall Restrictions Weight Bearing Restrictions: Yes LLE Weight Bearing: Non weight bearing   Pertinent Vitals/Pain SpO2 88-95% on 2L nasal cannula.   HR fluctuating 100s-120s while sitting EOB.     ADL  Grooming: Performed;Wash/dry face;Set up Where Assessed - Grooming: Supine, head of bed up Lower Body Dressing: +1 Total assistance;Performed (donned socks) Where Assessed - Lower Body Dressing: Supine, head of bed up Transfers/Ambulation Related to ADLs: Pt too fatigued sitting EOB to attempt OOB transfer at this time.   ADL Comments: Total assist with BADLs at bed level.   Bil UEs very edematous and very effortful to move.    OT Diagnosis: Acute pain;Generalized weakness  OT Problem List: Decreased strength;Decreased activity tolerance;Impaired balance (sitting and/or standing);Decreased knowledge of use of DME or AE;Decreased safety awareness;Pain;Obesity;Increased edema OT Treatment Interventions: Self-care/ADL training;Therapeutic exercise;DME and/or AE instruction;Therapeutic activities;Patient/family education;Balance training   OT Goals(Current goals can be found in the care plan section) Acute  Rehab OT Goals Patient Stated Goal: to get stronger OT Goal Formulation: With patient Time For Goal Achievement: 10/15/13 Potential to Achieve Goals: Good  Visit Information  Last OT Received On: 10/01/13 PT/OT Co-Evaluation/Treatment: Yes History of Present Illness: Pt admitted after fall and fx of distal femur s/p nailing with post op drop in BP.       Prior Functioning     Home Living Family/patient expects to be discharged to:: Private residence Living Arrangements: Alone Available Help at Discharge: Family;Available PRN/intermittently Type of Home: Skilled Nursing Facility Prior Function Level of Independence: Needs assistance ADL's / Homemaking Assistance Needed: Assist from children when going out in community. Communication Communication: No difficulties         Vision/Perception     Cognition  Cognition Arousal/Alertness: Awake/alert Behavior During Therapy: WFL for tasks assessed/performed Overall Cognitive Status: Within Functional Limits for tasks assessed    Extremity/Trunk Assessment Upper Extremity Assessment Upper Extremity Assessment: Generalized weakness (edematous in bil UE)     Mobility Bed Mobility Bed Mobility: Supine to Sit;Sitting - Scoot to Edge of Bed;Sit to Supine Supine to Sit: 1: +2 Total assist;HOB elevated Supine to Sit: Patient Percentage: 0% Sitting - Scoot to Edge of Bed: 1: +2 Total assist Sitting - Scoot to Edge of Bed: Patient Percentage: 0% Sit to Supine: 1: +2 Total assist;HOB flat Sit to Supine: Patient Percentage: 0% Details for Bed Mobility Assistance: Assist for to provide truncal support and to bring LEs OOB. Pt unable to reach for bed rail due to UEs weakness and large body habitus. Use of chuck pad to pivot hips to EOB.     Exercise     Balance Balance Balance Assessed: Yes Static Sitting Balance Static Sitting - Balance Support:  Bilateral upper extremity supported;Feet supported Static Sitting - Level of  Assistance: 3: Mod assist;2: Max assist Static Sitting - Comment/# of Minutes: Pt sat EOB 10 min. Pt initally requiring max assist with support provided posteriorly.  Once pt was able to bring UEs forward for support, then only required mod assist.     End of Session OT - End of Session Equipment Utilized During Treatment: Oxygen Activity Tolerance: Patient limited by fatigue Patient left: in bed;with call bell/phone within reach Nurse Communication: Mobility status (sat EOB)  GO    10/01/2013 Cipriano Mile OTR/L Pager [phone] Office [phone]  Cipriano Mile 10/01/2013, 11:26 AM

## 2013-10-01 NOTE — Progress Notes (Signed)
Subjective:  Patient reports pain as mild, She seems to have some disorientation.   Objective:   VITALS:   Filed Vitals:   10/01/13 0500 10/01/13 0600 10/01/13 0700 10/01/13 0800  BP: 101/60 99/65 94/33  111/27  Pulse: 110 99 104 93  Temp:   97.8 F (36.6 C)   TempSrc:   Oral   Resp: 22 20 23 18   Height:      Weight:      SpO2: 93% 94% 98% 93%    Physical Exam alert and oriented to person, place, date Face is symetric  Dressing: C/D/I  Compartments soft  SILT DP/SP/S/S/T, 2+DP, +TA/GS/EHL  LABS  Results for orders placed during the hospital encounter of 09/28/13 (from the past 24 hour(s))  PRO B NATRIURETIC PEPTIDE     Status: Abnormal   Collection Time    09/30/13  3:30 PM      Result Value Range   Pro B Natriuretic peptide (BNP) 6174.0 (*) 0 - 450 pg/mL  CBC     Status: Abnormal   Collection Time    09/30/13  4:45 PM      Result Value Range   WBC 7.3  4.0 - 10.5 K/uL   RBC 2.80 (*) 3.87 - 5.11 MIL/uL   Hemoglobin 7.5 (*) 12.0 - 15.0 g/dL   HCT 30.8 (*) 65.7 - 84.6 %   MCV 86.8  78.0 - 100.0 fL   MCH 26.8  26.0 - 34.0 pg   MCHC 30.9  30.0 - 36.0 g/dL   RDW 96.2 (*) 95.2 - 84.1 %   Platelets 91 (*) 150 - 400 K/uL  PREPARE RBC (CROSSMATCH)     Status: None   Collection Time    09/30/13  8:30 PM      Result Value Range   Order Confirmation       Value: ORDER PROCESSED BY BLOOD BANK BB SAMPLE OR UNITS ALREADY AVAILABLE NOTIFIED MELISSA AT 2000 09/30/13  BASIC METABOLIC PANEL     Status: Abnormal   Collection Time    10/01/13  2:46 AM      Result Value Range   Sodium 138  135 - 145 mEq/L   Potassium 3.5  3.5 - 5.1 mEq/L   Chloride 101  96 - 112 mEq/L   CO2 28  19 - 32 mEq/L   Glucose, Bld 108 (*) 70 - 99 mg/dL   BUN 34 (*) 6 - 23 mg/dL   Creatinine, Ser 3.24 (*) 0.50 - 1.10 mg/dL   Calcium 8.3 (*) 8.4 - 10.5 mg/dL   GFR calc non Af Amer 39 (*) >90 mL/min   GFR calc Af Amer 45 (*) >90 mL/min  PROTIME-INR     Status: Abnormal   Collection Time   10/01/13  2:46 AM      Result Value Range   Prothrombin Time 24.3 (*) 11.6 - 15.2 seconds   INR 2.27 (*) 0.00 - 1.49  CBC     Status: Abnormal   Collection Time    10/01/13  3:00 AM      Result Value Range   WBC 8.0  4.0 - 10.5 K/uL   RBC 3.12 (*) 3.87 - 5.11 MIL/uL   Hemoglobin 8.3 (*) 12.0 - 15.0 g/dL   HCT 40.1 (*) 02.7 - 25.3 %   MCV 85.6  78.0 - 100.0 fL   MCH 26.6  26.0 - 34.0 pg   MCHC 31.1  30.0 - 36.0 g/dL   RDW 66.4 (*)  11.5 - 15.5 %   Platelets 85 (*) 150 - 400 K/uL  CBC     Status: Abnormal   Collection Time    10/01/13  8:55 AM      Result Value Range   WBC 8.1  4.0 - 10.5 K/uL   RBC 3.28 (*) 3.87 - 5.11 MIL/uL   Hemoglobin 8.6 (*) 12.0 - 15.0 g/dL   HCT 16.1 (*) 09.6 - 04.5 %   MCV 87.8  78.0 - 100.0 fL   MCH 26.2  26.0 - 34.0 pg   MCHC 29.9 (*) 30.0 - 36.0 g/dL   RDW 40.9 (*) 81.1 - 91.4 %   Platelets 91 (*) 150 - 400 K/uL     Assessment/Plan: 2 Days Post-Op   Principal Problem:   Femur fracture, left Active Problems:   HYPERLIPIDEMIA-MIXED   OBESITY, NOS   HYPERTENSION, BENIGN SYSTEMIC   PULMONARY HYPERTENSION   Atrial fibrillation   ANEURYSM, THORACIC AORTIC   RENAL INSUFFICIENCY, CHRONIC   OSTEOARTHRITIS, MULTI SITES   Anemia   Chronic diastolic CHF (congestive heart failure)   PLAN: Weight Bearing: NWB LLE Dressings: change prior to d/c VTE prophylaxis: SCD's, ASA 325 mental status improved Dispo: SNF likely   Margarita Rana, D 10/01/2013, 10:24 AM   Margarita Rana, MD Cell [phone]

## 2013-10-01 NOTE — Evaluation (Signed)
Physical Therapy Evaluation Patient Details Name: Annleigh Knueppel MRN: [id number] DOB: Apr 30, 1933 Today's Date: 10/01/2013 Time: 4098-1191 PT Time Calculation (min): 40 min  PT Assessment / Plan / Recommendation History of Present Illness  Pt admitted after fall and fx of left distal femur s/p nailing with post op drop in BP.  Clinical Impression  Patient is s/p IM left femur surgery resulting in functional limitations due to the deficits listed below (see PT Problem List). Pt easily fatigued and becomes SOB sitting EOB.  Limiting overall evaluation.  Patient will benefit from skilled PT to increase their independence and safety with mobility to allow discharge to the venue listed below.      PT Assessment  Patient needs continued PT services    Follow Up Recommendations  SNF    Equipment Recommendations  Other (comment) (TBD)    Frequency Min 3X/week    Precautions / Restrictions Precautions Precautions: Fall Restrictions Weight Bearing Restrictions: Yes LLE Weight Bearing: Non weight bearing   Pertinent Vitals/Pain C/o left LE pain with mobility but does not rate. SpO2 88-95% on 2L nasal cannula. HR fluctuating 100s-120s while sitting EOB.        Mobility  Bed Mobility Bed Mobility: Supine to Sit;Sitting - Scoot to Edge of Bed;Sit to Supine Supine to Sit: 1: +2 Total assist;HOB elevated Supine to Sit: Patient Percentage: 0% Sitting - Scoot to Edge of Bed: 1: +2 Total assist Sitting - Scoot to Edge of Bed: Patient Percentage: 0% Sit to Supine: 1: +2 Total assist;HOB flat Sit to Supine: Patient Percentage: 0% Details for Bed Mobility Assistance: Assist for to provide truncal support and to bring LEs OOB. Pt unable to reach for bed rail due to UEs weakness and large body habitus. Use of chuck pad to pivot hips to EOB. Transfers Transfers: Not assessed Ambulation/Gait Ambulation/Gait Assistance: Not tested (comment)    Exercises General Exercises - Lower  Extremity Ankle Circles/Pumps: AAROM;Both;10 reps   PT Diagnosis: Difficulty walking;Generalized weakness;Acute pain  PT Problem List: Decreased strength;Decreased activity tolerance;Decreased balance;Decreased mobility;Decreased cognition;Decreased knowledge of use of DME;Decreased safety awareness;Decreased knowledge of precautions;Pain;Obesity PT Treatment Interventions: DME instruction;Gait training;Functional mobility training;Therapeutic activities;Therapeutic exercise;Balance training;Patient/family education     PT Goals(Current goals can be found in the care plan section) Acute Rehab PT Goals Patient Stated Goal: to get stronger PT Goal Formulation: With patient Time For Goal Achievement: 10/15/13 Potential to Achieve Goals: Good  Visit Information  Last PT Received On: 10/01/13 Assistance Needed: +2 History of Present Illness: Pt admitted after fall and fx of distal femur s/p nailing with post op drop in BP.       Prior Functioning  Home Living Family/patient expects to be discharged to:: Private residence Living Arrangements: Alone Available Help at Discharge: Family;Available PRN/intermittently Type of Home: Skilled Nursing Facility Prior Function Level of Independence: Needs assistance ADL's / Homemaking Assistance Needed: Assist from children when going out in community. Communication Communication: No difficulties    Cognition  Cognition Arousal/Alertness: Awake/alert Behavior During Therapy: WFL for tasks assessed/performed Overall Cognitive Status: Within Functional Limits for tasks assessed    Extremity/Trunk Assessment Upper Extremity Assessment Upper Extremity Assessment: Generalized weakness (edematous in bil UE) Lower Extremity Assessment Lower Extremity Assessment: Generalized weakness   Balance Balance Balance Assessed: Yes Static Sitting Balance Static Sitting - Balance Support: Bilateral upper extremity supported;Feet supported Static Sitting  - Level of Assistance: 3: Mod assist;2: Max assist Static Sitting - Comment/# of Minutes:   Pt sat EOB 10 min. Pt initally  requiring max assist with support provided posteriorly. Once pt was able to bring UEs forward for support, then only required mod assist   End of Session PT - End of Session Equipment Utilized During Treatment: Oxygen (2L) Activity Tolerance: Patient limited by fatigue;Patient limited by pain Patient left: in bed;with call bell/phone within reach Nurse Communication: Mobility status;Precautions  GP     Kayelyn Lemon 10/01/2013, 12:07 PM  Jake Shark, PT DPT [phone]

## 2013-10-01 NOTE — Progress Notes (Signed)
FPTS Interim Note:  Came to check on Laura Griffith on 09/30/13 around 9pm due to call by RN for hypotension. Additionally, concern for Hg of 7.5 from 7.4 after 1u pRBC.  Patient was alert and oriented x4, in no acute distress. No chest pain, no shortness of breath.  She denies any melena or bloody stools. She denies any abdominal pain. She has hematomas on her arms from previous IV's.  General: laying in bed, in no acute distress CV: s1s2, rrr, no murmur appreciated Pulm: cta b/l on anterior exam Left leg dressing clean and dry without bleeding.  Abd: soft, non tender non distended.   A/P:  - anemia: unclear reason to explain why Hg is not responding s/p blood transfusion. No obvious source of bleeding at surgical site. No obvious GI bleed. If continues to drop or not increase, consider CT abdomen/pelvis to rule out retroperitoneal bleed - hypotension: give 500cc/ bolus and transfuse blood with hope that this will help. Otherwise, call cardiology for further recommendations.   Marena Chancy, PGY-3 Family Medicine Resident

## 2013-10-01 NOTE — Consult Note (Signed)
PULMONARY  / CRITICAL CARE MEDICINE  Name: Laura Griffith MRN: [id number] DOB: 08/23/1933    ADMISSION DATE:  09/28/2013 CONSULTATION DATE:  10/01/13  REFERRING MD :  FPTS PRIMARY SERVICE: FPTS  CHIEF COMPLAINT:  Hypotension / fluid overload  BRIEF PATIENT DESCRIPTION: 79 yo with Pulmonary HTN, diastolic CHF, AF, CKD, and DVT s/p filter placement admitted 11/11 with L femur fx after a fall.  Underwent surgical repair 11/11.  On 11/12 developed hypotension and was aggressively fluid resuscitated. On 11/13 developed dyspnea / wheezing and PCCM was consulted.   SIGNIFICANT EVENTS / STUDIES:  11/11  OR >>> femoral fx repair  11/13  TTE >>>  LINES / TUBES: Foley 11/13 >>>  CULTURES: Urine 11/10>>> Enterobacter (Cefazolin resistent)   ANTIBIOTICS: Levaquin 11/12 >>>  HISTORY OF PRESENT ILLNESS:  79 yo with Pulmonary HTN, diastolic CHF, AF, CKD, and DVT s/p filter placement admitted 11/11 with L femur fx after a fall.  Underwent surgical repair 11/11.  On 11/12 developed hypotension and was aggressively fluid resuscitated. On 11/13 developed dyspnea / wheezing and PCCM was consulted. C/o mild SOB but only with exertion.  Feels near baseline except for her leg.  Did have UTI x 2 weeks ago and rx with abx as outpt.  Otherwise in usual state of poor health prior to fall.  States current edema is about normal for her.  Denies chest pain, cough, hemoptysis.    PAST MEDICAL HISTORY :  Past Medical History  Diagnosis Date  . Obesity   . Depression   . HTN (hypertension)   . Hyperlipemia   . CKD (chronic kidney disease), stage III     baseline 1.6-1.8 - Cr 0.7 (`00) 1.6  8/02  1.5 (2/03), Cr. 0.9  8/03 1.1 (11/05), Creatinine Clearance 8/02 about 50 ml/min MSE:  30/30 (09/2006)  . Aortic aneurysm     a. Followed by Dr. Dorris Fetch;  b. 01/2013 MR angio: 4.7 x 4.9 cm Asc Ao Aneurysm - stable.  . Atrial fibrillation     a. Noted initially at adenosine myoview 8/10 --> chronic atrial  fibrillation.  . Femur fracture, left     a. 11/2-14 s/p IM nail  . DVT (deep venous thrombosis)     a. s/p IVC filter.  . Chronic diastolic CHF (congestive heart failure)     a. 09/2012 Echo: EF 50-55%, mild LVH, mild MR, mod dil LA, mild-mod TR, mildly increased PASP.  Marland Kitchen History of pulmonary embolism     a. s/p IVC filter;  b. chronic coumadin  . Skin cancer of face   . H/O cardiovascular stress test     a. 12/2010, normal myoview.  . Osteoarthritis    Past Surgical History  Procedure Laterality Date  . Cataract extraction    . Cholecystectomy    . Hernia repair    . Hysterotomy    . Osteotomy    . Orif deltoid ligament Right 05/2003    [name] 7/8/20014 (09/28/2013)  . Orif tibia & fibula fractures Right 06/17/2007    [name] 06/19/2007 (09/28/2013)  . Tonsillectomy and adenoidectomy  1940's    [name] 06/17/2007 (09/28/2013)  . Total knee arthroplasty Right 2004    [name] 06/17/2007 (09/28/2013)  . Vena cava filter placement      /medical hx notes (09/28/2013)   Prior to Admission medications   Medication Sig Start Date End Date Taking? Authorizing Provider  Calcium Carbonate-Vitamin D (CALCIUM-VITAMIN D) 500-200 MG-UNIT per tablet Take 1 tablet by mouth 2 (two) times  daily with a meal. 05/12/12  Yes Shelly Rubenstein, MD  cephALEXin (KEFLEX) 500 MG capsule Take 1 capsule (500 mg total) by mouth 3 (three) times daily. 09/25/13  Yes Tawni Carnes, MD  fish oil-omega-3 fatty acids 1000 MG capsule Take 2 capsules (2 g total) by mouth daily. 05/12/12  Yes Shelly Rubenstein, MD  hydrochlorothiazide (HYDRODIURIL) 25 MG tablet Take 25 mg by mouth daily.   Yes Historical Provider, MD  losartan (COZAAR) 100 MG tablet Take 100 mg by mouth daily.   Yes Historical Provider, MD  metoprolol succinate (TOPROL-XL) 50 MG 24 hr tablet Take 75 mg by mouth daily. Take with or immediately following a meal.   Yes Historical Provider, MD  oxybutynin (DITROPAN-XL) 5 MG 24 hr tablet Take 5 mg by mouth daily.    Yes Historical Provider, MD  rosuvastatin (CRESTOR) 10 MG tablet Take 10 mg by mouth daily.   Yes Historical Provider, MD  sertraline (ZOLOFT) 100 MG tablet Take 100 mg by mouth daily.   Yes Historical Provider, MD  traMADol (ULTRAM) 50 MG tablet Take 100 mg by mouth every 12 (twelve) hours as needed for moderate pain or severe pain.   Yes Historical Provider, MD  vitamin B-12 (CYANOCOBALAMIN) 1000 MCG tablet Take 1 tablet (1,000 mcg total) by mouth daily. 01/05/13  Yes Shelly Rubenstein, MD  warfarin (COUMADIN) 1 MG tablet Take 1-2 mg by mouth daily at 6 PM. Pt takes 2 mg every day except 1 mg on wed, fri   Yes Historical Provider, MD   Allergies  Allergen Reactions  . Sulfamethoxazole Hives   FAMILY HISTORY:  Family History  Problem Relation Age of Onset  . Breast cancer Mother   . Heart disease Sister   . Heart disease Brother    SOCIAL HISTORY:  reports that she has never smoked. She has never used smokeless tobacco. She reports that she does not drink alcohol or use illicit drugs.  REVIEW OF SYSTEMS:  As per HPI - all other systems reviewed and were neg.    VITAL SIGNS: Temp:  [97.8 F (36.6 C)-99 F (37.2 C)] 98.2 F (36.8 C) (11/13 1603) Pulse Rate:  [90-117] 117 (11/13 1621) Resp:  [16-30] 30 (11/13 1621) BP: (83-125)/(26-69) 92/45 mmHg (11/13 1621) SpO2:  [93 %-98 %] 93 % (11/13 1603)  HEMODYNAMICS:   VENTILATOR SETTINGS:   INTAKE / OUTPUT: Intake/Output     11/12 0701 - 11/13 0700 11/13 0701 - 11/14 0700   P.O. 1200 360   I.V. (mL/kg) 1230 (11) 210 (1.9)   Blood 638.3    IV Piggyback 500    Total Intake(mL/kg) 3568.3 (32) 570 (5.1)   Urine (mL/kg/hr) 250 (0.1) 900 (0.8)   Blood     Total Output 250 900   Net +3318.3 -330        Urine Occurrence 6 x      PHYSICAL EXAMINATION: General:  Obese, chronically ill appearing female, NAD  Neuro:  Awake, alert, appropriate, MAE, L leg immobilizer  HEENT:  Mm moist, no JVD Cardiovascular:  s1s2 distant,  irreg Lungs:  resps even non labored on , diminished throughout, few scattered rales  Abdomen:  Obese, soft, nt, +bs Musculoskeletal:  Warm and dry, generalized edema, L leg immobilizer, good pedal pulses   LABS:  CBC  Recent Labs Lab 10/01/13 0300 10/01/13 0855 10/01/13 1625  WBC 8.0 8.1 6.8  HGB 8.3* 8.6* 8.4*  HCT 26.7* 28.8* 27.6*  PLT 85* 91*  102*   Coag's  Recent Labs Lab 09/29/13 0649 09/30/13 0426 10/01/13 0246  INR 2.00* 2.05* 2.27*   BMET  Recent Labs Lab 09/29/13 2127 09/30/13 0426 10/01/13 0246  NA 143 142 138  K 3.6 3.5 3.5  CL 103 104 101  CO2 32 31 28  BUN 39* 38* 34*  CREATININE 1.29* 1.42* 1.29*  GLUCOSE 116* 109* 108*   Electrolytes  Recent Labs Lab 09/29/13 2127 09/30/13 0426 10/01/13 0246  CALCIUM 8.7 8.5 8.3*   Sepsis Markers No results found for this basename: LATICACIDVEN, PROCALCITON, O2SATVEN,  in the last 168 hours  ABG No results found for this basename: PHART, PCO2ART, PO2ART,  in the last 168 hours  Liver Enzymes  Recent Labs Lab 10/01/13 0246  AST 20  ALT <5  ALKPHOS 51  BILITOT 0.4  ALBUMIN 2.3*   Cardiac Enzymes  Recent Labs Lab 09/30/13 1530  PROBNP 6174.0*   Glucose  Recent Labs Lab 09/29/13 0612 09/30/13 0149  GLUCAP 119* 131*    Imaging Dg Femur Left  09/30/2013   CLINICAL DATA:  Postop left femur.  EXAM: LEFT FEMUR - 2 VIEW  COMPARISON:  09/29/2013.  FINDINGS: Left femoral intra medullary rod and distal left femoral surgical screws are present. Comminuted distal femoral fractures are present. These appears in near anatomic alignment on today's exam. Surgical hardware noted of the proximal tibia. Degenerative changes left hip. Surgical clips left upper thigh. Calcifications in the pelvis consistent with phleboliths.  IMPRESSION: Patient status post open reduction internal fixation left distal femoral fracture with good anatomic alignment.   Electronically Signed   By: Maisie Fus  Register   On:  09/30/2013 16:04   Dg Chest Port 1 View  10/01/2013   CLINICAL DATA:  Shortness of breath and wheezing.  EXAM: PORTABLE CHEST - 1 VIEW  COMPARISON:  09/28/2013.  FINDINGS: Persistent cardiomegaly is present. Mild pulmonary venous prominence is present. Progressive bilateral mild interstitial prominence present. These findings suggest progressive congestive heart failure with mild interstitial edema. No focal alveolar infiltrate. No pneumothorax. Degenerative changes both shoulders.  IMPRESSION: Progressive congestive heart failure with mild pulmonary interstitial edema.   Electronically Signed   By: Maisie Fus  Register   On: 10/01/2013 10:14   ASSESSMENT / PLAN:  PULMONARY A:  Acute pulmonary edema in setting of aggressive fluid resuscitation and diastolic dysfunction.  Pulmonary hypertension. Possible OSA/OHS. P: Goal SpO2>92 Supplemental oxygen PRN Xopenex PRN only Diuresis as below  CARDIOVASCULAR A:  Hypotension - likely erroneous reading given body habitus. Acute on chronic diastolic heart failure. HTN. AF.  Doubt hypoperfusion on tissue level / shock.  DVT/PE s/p filter. P:  Goal MAP 50-55 Cardiology following Lasix 40 q12h Metoprolol low dose as likely beneficial by reducing HR in presence of diastolic dysfunction TTE Troponin Lactate  RENAL A:  CKD stage III - stable P:   Trend BMP  GASTROINTESTINAL A:  No active issue  P:   No indication PPI at this time   HEMATOLOGIC A:  Anemia - mild.  P:  Trend CBC Preadmission Coumadin (AF / VTE) held for surgery  INFECTIOUS A:  UTI -enterobacter. P:   Abx / cultures as abobe  ENDOCRINE A:  Unknown adrenal function P:   Cortisol level  NEUROLOGIC A:  Post op pain P:   Per Ortho  I have personally obtained a history, examined the patient, evaluated laboratory and imaging results, formulated the assessment and plan and placed orders.  Danford Bad, NP 10/01/2013  4:53 PM Pager: [phone] or [phone])  [phone]  I have personally obtained history, examined patient, evaluated and interpreted laboratory and imaging results, reviewed medical records, formulated assessment / plan and placed orders.  Lonia Farber, MD Pulmonary and Critical Care Medicine [facility] Pager: [phone]  10/01/2013, 6:10 PM

## 2013-10-01 NOTE — Progress Notes (Signed)
Family Medicine Teaching Service Daily Progress Note Intern Pager: [phone]  Patient name: Kerington Hildebrant Medical record number: [id number] Date of birth: [birth year] Age: 79 y.o. Gender: female  Primary Care Provider: Tawni Carnes, MD Consultants: Ortho Code Status:   Pt Overview and Major Events to Date:  11/10: Femur fx  11/11: Surgery today  Assessment and Plan: Tajai Suder is a 79 y.o. female presenting with a femur fracture. PMH is significant for pulmonary hypertension, hypertension, dCHF and atrial fibrillation   #Femur fracture:   Follow-up orthopedic recommendations for surgery   INTRAMEDULLARY (IM) RETROGRADE FEMORAL NAILING today  Hgb: 8.3, Plts 85; post 2u RBCs Pain control: tylenol scheduled; Vicodin prn PT/OT to eval/treat post surgery  #Atrial fibrillation:  Currently in Afib; restart metoprolol dec to 12.5 BID Consult Cardiology; considering Digoxin   INR of 2.27  Holding warfarin and ASA 325  # CKD stage 3  - Cr 1.34 on admit; Cr improved today 1.29 - Rehydrate and monitor  #Hypotension: Hx of HTN   BP last 24: 83-114)/(27-65) 111/27 mmHg   Holding hctz and losartan  Metoprolol dec to 12.5 BID  #Wheezing: no known history of asthma or COPD. Does not use an albuterol inhaler at home.  levalbuterol nebulizer q8hours for wheezing [place] 2L O2 Monitor for possible increase HR ECHO order CXR today Incentive spirometry Lasix 60 iv x 1; Foley placed  # Delirium: Resolved - A&Ox4 this morning - Continue to assess; treat pain and orient often  # UTI - POA on Keflex (started 11/7); CTX given in ED - UA: Leu (+); Nit (-) - Culture: > 100,000 Gram - rogs - Pt is afebile and asymptomatic, WBC wnl ; - Continue Levaquin   FEN/GI: Heart diet - KVO Prophylaxis: SCDs  Disposition: On tele; dispo pending ortho recs  Subjective: She denies any Leg pain, CP, SOB or palpitations at this time. Denies any UTI symptoms. Some cough which is nonproductive and  unchanged from admit Objective: Temp:  [97.8 F (36.6 C)-99.3 F (37.4 C)] 97.8 F (36.6 C) (11/13 0700) Pulse Rate:  [62-122] 93 (11/13 0800) Resp:  [16-30] 18 (11/13 0800) BP: (83-114)/(27-65) 111/27 mmHg (11/13 0800) SpO2:  [93 %-98 %] 93 % (11/13 0800) Physical Exam: General: Laying in bed in no acute distress  Cardiovascular: Afib w/ RVR, no murmurs, rubs or gallops, distant heart sounds due to body habitus  Respiratory: Moderate Wheeze and crackles throughout  Abdomen: soft, obese, non-tender  Extremities: moderate  edema bilaterally LE, non-pitting, 2+ pedal pulses: sensation intact b/l; Minimal bruising surrounding stapes w/o bleeding Skin: ecchymosis on left forearm from venipuncture  Neuro: Alert, oriented x4  Laboratory:  Recent Labs Lab 09/30/13 0426 09/30/13 1645 10/01/13 0300  WBC 9.2 7.3 8.0  HGB 7.4* 7.5* 8.3*  HCT 24.9* 24.3* 26.7*  PLT 110* 91* 85*    Recent Labs Lab 09/29/13 2127 09/30/13 0426 10/01/13 0246  NA 143 142 138  K 3.6 3.5 3.5  CL 103 104 101  CO2 32 31 28  BUN 39* 38* 34*  CREATININE 1.29* 1.42* 1.29*  CALCIUM 8.7 8.5 8.3*  GLUCOSE 116* 109* 108*   Imaging/Diagnostic Tests: CT Knee  - There is an impacted displaced and comminuted fracture through the  distal left femur. Approximately 14 mm of overlap/impaction.  Approximately 10 mm posterior displacement. There is also mild  medial displacement of the distal fracture fragments. Moderate joint  effusion.   DG Femur IMPRESSION:  The patient has sustained an acute comminuted slightly angulated  displaced fracture of the distal left femoral metadiaphysis.   CXR IMPRESSION:  The findings suggest low-grade compensated CHF. There is no  significant interstitial or alveolar edema.   [name], MD 10/01/2013, 9:23 AM PGY-1, [facility] FPTS Intern pager: [phone], text pages welcome

## 2013-10-01 NOTE — Progress Notes (Signed)
Attending Addendum  I examined the patient and discussed the assessment and plan with Dr. Gayla Doss. I have reviewed the note and agree.  Patient is awake and alert. New onset crackles on lungs exam. Extremity edema cardiac exam, irreg/irreg HR, hypotension. LLE stable evaluated no oozing or extensive bruising.   Poor Hgb response to 2 U PRBC.  Elevated proBNP.   Plan: KVO IV Lasix 60 mg IV x 1 and f/u response. Place foley to monitor Is and Os.  F/u repeat ECHO. Agree with holding coumadin and ASA as well as vit K while evaluating H/H (? Bleed) repeat CBC at 1600. Appreciate cardiology evaluation and recommendations.       Dessa Phi, MD FAMILY MEDICINE TEACHING SERVICE

## 2013-10-01 NOTE — Consult Note (Signed)
CARDIOLOGY CONSULT NOTE   Patient ID: Laura Griffith MRN: [id number], DOB/AGE: 06/06/1933   Admit date: 09/28/2013 Date of Consult: 10/01/2013  Primary Physician: Tawni Carnes, MD Primary Cardiologist: Golden Circle, MD   Pt. Profile  79 year old female with history of chronic diastolic CHF and chronic atrial fibrillation and we have been asked to evaluate secondary to elevated heart rates in the setting of recent femur fracture, surgery, anemia, pain, hypotension, and volume overload.  Problem List  Past Medical History  Diagnosis Date  . Obesity   . Depression   . HTN (hypertension)   . Hyperlipemia   . CKD (chronic kidney disease), stage III     baseline 1.6-1.8 - Cr 0.7 (`00) 1.6  8/02  1.5 (2/03), Cr. 0.9  8/03 1.1 (11/05), Creatinine Clearance 8/02 about 50 ml/min MSE:  30/30 (09/2006)  . Aortic aneurysm     a. Followed by Dr. Dorris Fetch;  b. 01/2013 MR angio: 4.7 x 4.9 cm Asc Ao Aneurysm - stable.  . Atrial fibrillation     a. Noted initially at adenosine myoview 8/10 --> chronic atrial fibrillation.  . Femur fracture, left     a. 11/2-14 s/p IM nail  . DVT (deep venous thrombosis)     a. s/p IVC filter.  . Chronic diastolic CHF (congestive heart failure)     a. 09/2012 Echo: EF 50-55%, mild LVH, mild MR, mod dil LA, mild-mod TR, mildly increased PASP.  Marland Kitchen History of pulmonary embolism     a. s/p IVC filter;  b. chronic coumadin  . Skin cancer of face   . H/O cardiovascular stress test     a. 12/2010, normal myoview.  . Osteoarthritis     Past Surgical History  Procedure Laterality Date  . Cataract extraction    . Cholecystectomy    . Hernia repair    . Hysterotomy    . Osteotomy    . Orif deltoid ligament Right 05/2003    [name] 7/8/20014 (09/28/2013)  . Orif tibia & fibula fractures Right 06/17/2007    [name] 06/19/2007 (09/28/2013)  . Tonsillectomy and adenoidectomy  1940's    [name] 06/17/2007 (09/28/2013)  . Total knee arthroplasty Right 2004    [name]  06/17/2007 (09/28/2013)  . Vena cava filter placement      /medical hx notes (09/28/2013)     Allergies  Allergies  Allergen Reactions  . Sulfamethoxazole Hives   HPI   79 year old female with prior history of chronic diastolic congestive heart failure along with descending aortic aneurysm being followed closely by Dr. Dorris Fetch. She also has chronic rate-controlled atrial fibrillation and is chronically anticoagulated with Coumadin in the setting of both A. fib and history of pulmonary embolus. Patient was in her usual state of health until November 10 when she was ambulating with the assistance of one of her children, became unsteady, and fell to the floor. This resulted in severe left leg pain. She was taken to the [place] where imaging revealed an impacted and comminuted displaced distal left femur fracture. As she is on chronic Coumadin, her INR was elevated at 1.99. She was treated with FFP and seen by orthopedic surgery. She underwent successful intramedullary femoral nailing on the afternoon of the 11th. Post operatively, she was anemic with her hemoglobin dropping to 7.4 and hematocrit 24.9. She received packed red blood cells and was also aggressively hydrated in the setting of hypotension. This unfortunately resulted in dyspnea, wheezing, and crackles, as well as disorientation. Chest x-ray has shown progressive  CHF and mild interstitial edema and she has been treated with IV Lasix this morning. In the setting of her current comorbidities her heart rates have been elevated into the 1 teens while her blood pressures have remained soft in the 80-90 range. As result, her beta blocker dosage has been reduced. Currently patient denies chest pain, dyspnea, or left leg pain.  Inpatient Medications  . acetaminophen  650 mg Oral Q6H  . atorvastatin  20 mg Oral q1800  . levofloxacin  250 mg Oral Daily  . metoprolol tartrate  37.5 mg Oral BID  . phytonadione  5 mg Oral Once  . sertraline  100  mg Oral Daily    Family History Family History  Problem Relation Age of Onset  . Breast cancer Mother   . Heart disease Sister   . Heart disease Brother      Social History History   Social History  . Marital Status: Widowed    Spouse Name: N/A    Number of Children: N/A  . Years of Education: N/A   Occupational History  . Not on file.   Social History Main Topics  . Smoking status: Never Smoker   . Smokeless tobacco: Never Used  . Alcohol Use: No  . Drug Use: No  . Sexual Activity: Not Currently   Other Topics Concern  . Not on file   Social History Narrative   Lives alone. She has no problems grooming, bathing herself, getting around the house. She likes to read and reads the newspaper daily.    Children live within 1 mile of her & very supportive and involved in her care. Her children buy groceries and sort out her medications for her which she is able to take by herself.    No EtOH, no tobacco.     Widowed.   Walks with walker all the time.                  Review of Systems  General:  No chills, fever, night sweats or weight changes.  Cardiovascular:  No chest pain, positive dyspnea earlier this morning, no edema, orthopnea, palpitations, paroxysmal nocturnal dyspnea. Dermatological: No rash, lesions/masses Respiratory: Positive wet cough, positive dyspnea, positive wheezing Urologic: No hematuria, dysuria Abdominal:   No nausea, vomiting, diarrhea, bright red blood per rectum, melena, or hematemesis Neurologic:  No visual changes, wkns, changes in mental status. All other systems reviewed and are otherwise negative except as noted above.  Physical Exam  Blood pressure 111/27, pulse 93, temperature 97.8 F (36.6 C), temperature source Oral, resp. rate 18, height 5\' 3"  (1.6 m), weight 245 lb 13 oz (111.5 kg), SpO2 93.00%.  General: Pleasant, NAD Psych: Normal affect. Neuro: Alert and oriented X 3. Moves all extremities spontaneously. HEENT:  Normal  Neck: Supple without bruits. Difficult to assess jugular venous pressure secondary to neck girth. Lungs:  Resp regular and unlabored, bibasilar crackles. Heart: Irregular no s3, s4, or murmurs. Abdomen: Soft, non-tender, non-distended, BS + x 4.  Extremities: No clubbing, cyanosis. 1+ diffuse edema. DP 1+ and equal bilaterally. The left leg is wrapped.  Labs   Lab Results  Component Value Date   WBC 8.0 10/01/2013   HGB 8.3* 10/01/2013   HCT 26.7* 10/01/2013   MCV 85.6 10/01/2013   PLT 85* 10/01/2013     Recent Labs Lab 10/01/13 0246  NA 138  K 3.5  CL 101  CO2 28  BUN 34*  CREATININE 1.29*  CALCIUM 8.3*  GLUCOSE  108*   Lab Results  Component Value Date   CHOL 155 05/12/2012   HDL 41 05/12/2012   LDLCALC 78 05/12/2012   TRIG 178* 05/12/2012   Radiology/Studies  Dg Chest 2 View  09/28/2013   CLINICAL DATA:  Cough status post fall.  EXAM: CHEST  2 VIEW    IMPRESSION: The findings suggest low-grade compensated CHF. There is no significant interstitial or alveolar edema.   Electronically Signed   By: David  [place]   On: 09/28/2013 11:35   Dg Femur Left  09/30/2013   CLINICAL DATA:  Postop left femur.  EXAM: LEFT FEMUR - 2 VIEW    IMPRESSION: Patient status post open reduction internal fixation left distal femoral fracture with good anatomic alignment.   Electronically Signed   By: Maisie Fus  Register   On: 09/30/2013 16:04   Dg Femur Left  09/28/2013   CLINICAL DATA:  Pain status post fall.  EXAM: LEFT FEMUR - 2 VIEW    IMPRESSION: The patient has sustained an acute comminuted slightly angulated displaced fracture of the distal left femoral metadiaphysis.   Electronically Signed   By: David  [place]   On: 09/28/2013 11:41   Ct Knee Left Wo Contrast  09/28/2013   CLINICAL DATA:  Fall, leg pain.  EXAM: CT OF THE LEFT KNEE WITHOUT CONTRAST  IMPRESSION: Impacted, comminuted and displaced distal femoral fracture as described above.  Advanced osteoarthritis of the left  knee.   Electronically Signed   By: Charlett Nose M.D.   On: 09/28/2013 18:46   ECG  Afib, 94, ivcd, lad, ant/inf q's, no acute st/t changes.  ASSESSMENT AND PLAN  1. Left distal femur fracture: Status post intramedullary nailing by orthopedics. Eventual plan for rehabilitation stay.  2. Acute on chronic diastolic congestive heart failure: In the setting of anemia and hypotension requiring blood transfusion and volume resuscitation, patient has developed volume overload. Her blood pressures were soft yesterday and earlier today however are currently stable. She continues to have crackles on exam with a wet cough. Agree with further IV diuresis as she has a net positive of 4.3 L this admission.  Her beta blocker dose has been reduced secondary to relative hypotension. We'll plan to titrate this back to her previous home dose as her blood pressure stabilizes. Her home dose of Cozaar is currently on hold.  3. Chronic atrial fibrillation: Rate is currently in the 80-110 range. Suspect elevated rates are secondary to ongoing comorbid issues including pain, anemia, and dyspnea.  BB had been held 2/2 hypotn.  Agree with dropping dose for the time being.  Resume warfarin when felt to be safe from ortho standpoint.  4.  Ascending Ao Aneurysm:  Stable on last MR earlier this year.  Followed by Dr. Dorris Fetch.  5.  Anemia:  S/p transfusion, currently stable.  6.  Hypotn:  Currently stable.  Follow with diuresis.  7.  CKD III:  Stable.  Follow with diuresis.  8.  UTI:  abx per IM.  Signed, Nicolasa Ducking, NP 10/01/2013, 9:03 AM   Patient seen, examined. Available data reviewed. Agree with findings, assessment, and plan as outlined by Ward Givens, NP. Exam reveals a 79 year old woman in no distress. She is pleasant. Jugular venous pressure is moderately elevated. Lungs show decreased breath sounds in the bases. Heart is irregularly irregular and tachycardic. There is diffuse 1+ peripheral edema. I  have reviewed radiographic images, labs, vital signs, and notes. I agree with the assessment and plan as above. This  patient has postoperative volume overload (acute on chronic diastolic heart failure). It was necessary to give her significant amounts of IV fluids for resuscitation as well as packed red blood cells. She now has some volume overload and should be diuresed. Her atrial fibrillation is chronic and elevated ventricular rate is physiologic in the setting of a femur fracture with ongoing pain, anemia, etc. I would not try to slow her ventricular rate much and I think it is reasonable to continue her on a lower dose of beta blocker. Continue IV Lasix as long as blood pressure tolerates. Will follow with you. Thank you  Tonny Bollman, M.D. 10/01/2013 4:50 PM

## 2013-10-01 NOTE — Progress Notes (Signed)
Family Medicine PCP Social Note  I spoke with Laura Griffith this afternoon. She is doing fairly well though complains of some burning pain in both of her legs and that she feels she isn't getting much attention. I pointed out her call button for her and let her know all she has to do is press the button and someone will be in shortly to help her. I appreciate the excellent care Dr. Eulah Pont and the [facility] Medicine Teaching Service have been providing my patient.  Tawni Carnes, MD 10/01/2013, 3:04 PM PGY-1, [facility] Family Medicine

## 2013-10-02 ENCOUNTER — Inpatient Hospital Stay (HOSPITAL_COMMUNITY): Payer: Medicare HMO

## 2013-10-02 DIAGNOSIS — I359 Nonrheumatic aortic valve disorder, unspecified: Secondary | ICD-10-CM

## 2013-10-02 DIAGNOSIS — S72409A Unspecified fracture of lower end of unspecified femur, initial encounter for closed fracture: Secondary | ICD-10-CM

## 2013-10-02 DIAGNOSIS — E669 Obesity, unspecified: Secondary | ICD-10-CM

## 2013-10-02 DIAGNOSIS — N390 Urinary tract infection, site not specified: Secondary | ICD-10-CM

## 2013-10-02 DIAGNOSIS — J9620 Acute and chronic respiratory failure, unspecified whether with hypoxia or hypercapnia: Secondary | ICD-10-CM

## 2013-10-02 LAB — CORTISOL: Cortisol, Plasma: 14.5 ug/dL

## 2013-10-02 LAB — CBC
Hemoglobin: 8.1 g/dL — ABNORMAL LOW (ref 12.0–15.0)
MCH: 26.5 pg (ref 26.0–34.0)
MCV: 88.2 fL (ref 78.0–100.0)
Platelets: 105 10*3/uL — ABNORMAL LOW (ref 150–400)
RBC: 3.06 MIL/uL — ABNORMAL LOW (ref 3.87–5.11)
WBC: 7 10*3/uL (ref 4.0–10.5)

## 2013-10-02 LAB — PROTIME-INR: Prothrombin Time: 18 seconds — ABNORMAL HIGH (ref 11.6–15.2)

## 2013-10-02 MED ORDER — WARFARIN SODIUM 2 MG PO TABS
2.0000 mg | ORAL_TABLET | Freq: Once | ORAL | Status: AC
  Administered 2013-10-02: 2 mg via ORAL
  Filled 2013-10-02: qty 1

## 2013-10-02 MED ORDER — FUROSEMIDE 10 MG/ML IJ SOLN
40.0000 mg | Freq: Three times a day (TID) | INTRAMUSCULAR | Status: AC
  Administered 2013-10-02 – 2013-10-03 (×2): 40 mg via INTRAVENOUS
  Filled 2013-10-02: qty 4

## 2013-10-02 MED ORDER — WARFARIN - PHARMACIST DOSING INPATIENT: Freq: Every day | Status: DC

## 2013-10-02 MED ORDER — FUROSEMIDE 10 MG/ML IJ SOLN
40.0000 mg | Freq: Three times a day (TID) | INTRAMUSCULAR | Status: DC
  Administered 2013-10-02: 40 mg via INTRAVENOUS

## 2013-10-02 MED ORDER — METOPROLOL TARTRATE 12.5 MG HALF TABLET
12.5000 mg | ORAL_TABLET | Freq: Four times a day (QID) | ORAL | Status: DC
  Administered 2013-10-02 – 2013-10-04 (×7): 12.5 mg via ORAL
  Filled 2013-10-02 (×12): qty 1

## 2013-10-02 MED ORDER — FUROSEMIDE 10 MG/ML IJ SOLN: 40.0000 mg | Freq: Two times a day (BID) | INTRAMUSCULAR | Status: DC

## 2013-10-02 NOTE — Progress Notes (Signed)
Clinical Social Work Department CLINICAL SOCIAL WORK PLACEMENT NOTE 10/02/2013  Patient:  [facility]  Account Number:  [id number] Admit date:  09/28/2013  Clinical Social Worker:  Leron Croak, CLINICAL SOCIAL WORKER  Date/time:  10/02/2013 08:58 AM  Clinical Social Work is seeking post-discharge placement for this patient at the following level of care:   SKILLED NURSING   (*CSW will update this form in Epic as items are completed)   09/30/2013  Patient/family provided with [facility] Department of Clinical Social Work's list of facilities offering this level of care within the geographic area requested by the patient (or if unable, by the patient's family).  09/30/2013  Patient/family informed of their freedom to choose among providers that offer the needed level of care, that participate in Medicare, Medicaid or managed care program needed by the patient, have an available bed and are willing to accept the patient.  09/30/2013  Patient/family informed of [facility]' ownership interest in [facility], as well as of the fact that they are under no obligation to receive care at this facility.  PASARR submitted to EDS on 10/02/2013 PASARR number received from EDS on 10/02/2013  FL2 transmitted to all facilities in geographic area requested by pt/family on  09/29/2013 FL2 transmitted to all facilities within larger geographic area on 09/29/2013  Patient informed that his/her managed care company has contracts with or will negotiate with  certain facilities, including the following:     Patient/family informed of bed offers received:   Patient chooses bed at  Physician recommends and patient chooses bed at    Patient to be transferred to  on   Patient to be transferred to facility by   The following physician request were entered in Epic:   Additional Comments: Leron Croak LCSWA  [facility]

## 2013-10-02 NOTE — Progress Notes (Signed)
Chaplain made initial visit. Patient said she was "not doing very well." She said she was used to being very independent and now she had to ask "for permission to pee." Chaplain and pt discussed this loss of independence and dignity. Pt stated that she has eight children so she is "never alone." However, the patient said only two of them are local. Pt said she had never been confined to the bed before, though she has been using a walker for 10 years. Chaplain provided emotional and spiritual support as she shared her story.

## 2013-10-02 NOTE — Progress Notes (Signed)
Patient ID: Laura Griffith, female   DOB: 1933-02-09, 79 y.o.   MRN: [id number]    SUBJECTIVE: Patient is comfortable at rest.  Has not been out of bed.  CXR with less pulmonary edema today. HR in 90s, atrial fibrillation.   Marland Kitchen acetaminophen  650 mg Oral Q6H  . atorvastatin  20 mg Oral q1800  . furosemide  40 mg Intravenous Q8H  . levofloxacin  250 mg Oral Daily  . metoprolol tartrate  12.5 mg Oral Q6H  . sertraline  100 mg Oral Daily  . warfarin  2 mg Oral ONCE-1800  . Warfarin - Pharmacist Dosing Inpatient   Does not apply q1800      Filed Vitals:   10/02/13 0900 10/02/13 1000 10/02/13 1100 10/02/13 1145  BP: 91/43 94/47 98/49  98/49  Pulse: 93 88 96 101  Temp:    98.3 F (36.8 C)  TempSrc:    Oral  Resp: 21 20 19    Height:      Weight:      SpO2: 96% 94% 96%     Intake/Output Summary (Last 24 hours) at 10/02/13 1342 Last data filed at 10/02/13 1100  Gross per 24 hour  Intake    402 ml  Output   2625 ml  Net  -2223 ml    LABS: Basic Metabolic Panel:  Recent Labs  40/98/11 0426 10/01/13 0246  NA 142 138  K 3.5 3.5  CL 104 101  CO2 31 28  GLUCOSE 109* 108*  BUN 38* 34*  CREATININE 1.42* 1.29*  CALCIUM 8.5 8.3*   Liver Function Tests:  Recent Labs  10/01/13 0246  AST 20  ALT <5  ALKPHOS 51  BILITOT 0.4  PROT 5.6*  ALBUMIN 2.3*   No results found for this basename: LIPASE, AMYLASE,  in the last 72 hours CBC:  Recent Labs  10/01/13 1625 10/02/13 0435  WBC 6.8 7.0  HGB 8.4* 8.1*  HCT 27.6* 27.0*  MCV 87.1 88.2  PLT 102* 105*   Cardiac Enzymes:  Recent Labs  10/01/13 1834  TROPONINI <0.30   BNP: No components found with this basename: POCBNP,  D-Dimer: No results found for this basename: DDIMER,  in the last 72 hours Hemoglobin A1C: No results found for this basename: HGBA1C,  in the last 72 hours Fasting Lipid Panel: No results found for this basename: CHOL, HDL, LDLCALC, TRIG, CHOLHDL, LDLDIRECT,  in the last 72 hours Thyroid  Function Tests: No results found for this basename: TSH, T4TOTAL, FREET3, T3FREE, THYROIDAB,  in the last 72 hours Anemia Panel:  Recent Labs  10/01/13 0246  RETICCTPCT 2.0    RADIOLOGY: Dg Chest Portable 1 View  10/02/2013   CLINICAL DATA:  Shortness of breath  EXAM: PORTABLE CHEST - 1 VIEW  COMPARISON:  10/01/2013  FINDINGS: Cardiomegaly is again noted. The lungs are poorly aerated when compared with the prior exam. Some crowding of the vascular markings is seen. No focal confluent infiltrate is seen. The degree of vascular congestion and pulmonary edema has improved in the interval.  IMPRESSION: Poor inspiratory effort.  Improvement of vascular congestion when compare with the prior study.   Electronically Signed   By: Alcide Clever M.D.   On: 10/02/2013 07:39   PHYSICAL EXAM General: NAD Neck: JVP 10-12 cm, no thyromegaly or thyroid nodule.  Lungs: Dependent crackles. CV: Nondisplaced PMI.  Heart irregular S1/S2, no S3/S4, no murmur.  No peripheral edema.  Abdomen: Soft, nontender, no hepatosplenomegaly, no distention.  Neurologic: Alert  and oriented x 3.  Psych: Normal affect. Extremities: No clubbing or cyanosis.   TELEMETRY: Reviewed telemetry pt in atrial fibrillation in 90s  ASSESSMENT AND PLAN: 79 yo with chronic atrial fibrillation, chronic diastolic CHF, and ascending aortic aneurysm presented with femur fracture and developed post-op hypotension and acute/chronic diastolic CHF.  1. Atrial fibrillation: Rate in 90s.  She has chronic atrial fibrillation.  BP soft, for now given metroprolol 12.5 mg every 6 hrs.  Continue warfarin.  2. Acute/chronic diastolic CHF: She remains volume overloaded on exam.  Increase Lasix to 40 mg IV every 8 hrs today, monitor I/Os closely.  Will get echo.  Needs am BMET.  3. I am not sure that she is going to do well at home.  At baseline, she is not very mobile.  Would consider SNF but not sure she would agree.   [name] 10/02/2013 1:46  PM

## 2013-10-02 NOTE — Progress Notes (Signed)
Family Medicine Teaching Service Daily Progress Note Intern Pager: [phone]  Patient name: Laura Griffith Medical record number: [id number] Date of birth: 1933/01/09 Age: 79 y.o. Gender: female  Primary Care Provider: Tawni Carnes, MD Consultants: Ortho Code Status:   Pt Overview and Major Events to Date:  11/10: Femur fx  11/11: Surgery today  Assessment and Plan: Laura Griffith is a 79 y.o. female presenting with a femur fracture. PMH is significant for pulmonary hypertension, hypertension, dCHF and atrial fibrillation   #Femur fracture:   Follow-up orthopedic recommendations for surgery   INTRAMEDULLARY (IM) RETROGRADE FEMORAL NAILING  Pain control: tylenol scheduled; Vicodin prn PT/OT to eval/treat post surgery  # Combined Acute Blood Loss and Dilutional Anemia - Multiple fluid boluses postoperatively for hypotension  - 1 unit PCs ordered 09/30/13  - Admission H&H 11.0/37.6  - Lowest Postop H&H 7.4/24.9 Hgb: 8.3 > 8.1 (11/14), Plts 85 > 105 (11/14); post 3u RBCs Warfarin restarted 11/14 BP stable; Pt clinically improving  # Hypotension: possible due to inaccurate cuff readings BP last 24: (85-125)/(26-80) 104/41 mmHg   Holding hctz and losartan  Metoprolol dec to 12.5 BID  AM Cortisol: pending  Lactic acid: wnl  Trop neg  #Atrial fibrillation:  Currently in Afib w/o RVR; metoprolol dec to 12.5 BID Consult Cardiology; considering Digoxin   INR of 1.53  Warfarin restarted; holding ASA 325  # CKD stage 3  - Cr 1.34 on admit; Cr improved 1.29 (11/13) - Rehydrate and monitor  #SOB Wheezing: HxHF no known history of asthma or COPD. Does not use an albuterol inhaler at home.  levalbuterol nebulizer q8hours for wheezing [place] 2L O2 Tachypnea improving ECHO ordered  CXR: Progressive congestive heart failure with mild pulmonary interstitial edema. Incentive spirometry Lasix 40 IV BID; Foley placed UOP 1.6L in last 24hrs (Net +3.3); Wt 255 (247 admit)  #  Delirium: Resolved - A&Ox4 this morning - Continue to assess; treat pain and orient often  # UTI - POA on Keflex (started 11/7); CTX given in ED - UA: Leu (+); Nit (-) - Culture: > 100,000 Gram - rogs - Pt is afebile and asymptomatic, WBC wnl ; - Continue Levaquin Day 3; total Abx day 5  FEN/GI: Heart diet - KVO Prophylaxis: SCDs; Restart Warfarin per pharm today  Disposition: On tele; dispo pending ortho recs  Subjective:  She reports feeling better today, with improved breathing. She denies any Leg pain "as long as i don't move, i"m ok", CP, SOB or palpitations at this time. Denies any UTI symptoms.   Objective: Temp:  [98.2 F (36.8 C)-99.6 F (37.6 C)] 98.4 F (36.9 C) (11/14 0400) Pulse Rate:  [86-117] 89 (11/14 0600) Resp:  [17-30] 21 (11/14 0600) BP: (85-125)/(26-80) 104/41 mmHg (11/14 0600) SpO2:  [92 %-100 %] 96 % (11/14 0600) Weight:  [255 lb 11.7 oz (116 kg)] 255 lb 11.7 oz (116 kg) (11/14 0500) Physical Exam: General: Laying in bed in no acute distress  Cardiovascular: Afib w/o RVR, no murmurs, rubs or gallops, distant heart sounds due to body habitus  Respiratory: Mild Wheezes and crackles throughout  Abdomen: soft, obese, non-tender  Extremities: moderate  edema bilaterally LE, non-pitting, 2+ pedal pulses: sensation intact b/l;  Skin: ecchymosis on left forearm from venipuncture  Neuro: Alert, oriented x4  Laboratory:  Recent Labs Lab 10/01/13 0855 10/01/13 1625 10/02/13 0435  WBC 8.1 6.8 7.0  HGB 8.6* 8.4* 8.1*  HCT 28.8* 27.6* 27.0*  PLT 91* 102* 105*    Recent Labs Lab  09/29/13 2127 09/30/13 0426 10/01/13 0246  NA 143 142 138  K 3.6 3.5 3.5  CL 103 104 101  CO2 32 31 28  BUN 39* 38* 34*  CREATININE 1.29* 1.42* 1.29*  CALCIUM 8.7 8.5 8.3*  PROT  --   --  5.6*  BILITOT  --   --  0.4  ALKPHOS  --   --  51  ALT  --   --  <5  AST  --   --  20  GLUCOSE 116* 109* 108*   Imaging/Diagnostic Tests: CT Knee  - There is an impacted  displaced and comminuted fracture through the  distal left femur. Approximately 14 mm of overlap/impaction.  Approximately 10 mm posterior displacement. There is also mild  medial displacement of the distal fracture fragments. Moderate joint  effusion.   DG Femur IMPRESSION:  The patient has sustained an acute comminuted slightly angulated  displaced fracture of the distal left femoral metadiaphysis.   CXR IMPRESSION:  The findings suggest Griffith-grade compensated CHF. There is no  significant interstitial or alveolar edema.   Laura Low, MD 10/02/2013, 7:23 AM PGY-1, [facility] Family Medicine FPTS Intern pager: [phone], text pages welcome

## 2013-10-02 NOTE — Progress Notes (Signed)
Attending Addendum  I examined the patient and discussed the assessment and plan with Dr. Gayla Doss. I have reviewed the note and agree.  Patient BP and resp status continues to improve with IV lasix. Patient seen by Dr. Shirlee Latch, appreciate recommendations. She reports a fair day. Denies CP or SOB. Admits to being unsure about dispo plan. 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   O: Obese female, slight increase WOB Chest: irreg irreg HR, lying with head at 30 degrees no distress.   Hgb stable   Plan:  F/u AM CBC and BMP. ECHO done- Normal LV sizewith mild global hypokinesis, EF 50%. There was septal-lateral dyssynchrony suggestive of bundle branch block. The RV was moderately dilated with normal systolicfunction. The interventricular septum was D-shaped,suggesting RV pressure/volume overload. Moderate pulmonaryHTN. Dilated IVC suggested elevated RV filling pressure. Dosing lasix for 24 hrs at a time and reassessing (now 40 mg IV q 8).  Restart coumadin No aspirin.  SW consult for SNF availabilities. Also doing CIR consult.    Dessa Phi, MD FAMILY MEDICINE TEACHING SERVICE

## 2013-10-02 NOTE — ED Provider Notes (Signed)
Medical screening examination/treatment/procedure(s) were performed by non-physician practitioner and as supervising physician I was immediately available for consultation/collaboration.  EKG Interpretation     Ventricular Rate:    PR Interval:    QRS Duration:   QT Interval:    QTC Calculation:   R Axis:     Text Interpretation:               Ethelda Chick, MD 10/02/13 1714

## 2013-10-02 NOTE — Progress Notes (Signed)
No distress on [place] O2  Filed Vitals:   10/02/13 1100 10/02/13 1145 10/02/13 1500 10/02/13 1600  BP: 98/49 98/49    Pulse: 96 101    Temp:  98.3 F (36.8 C) 98.1 F (36.7 C)   TempSrc:  Oral Oral   Resp: 19   18  Height:      Weight:      SpO2: 96%   92%    Obese, NAD JVP not visualized Distant bibasilar crackles, few scattered wheezes IRIR, distant HS Obese, soft, NT Symmetric BUE and BLE edema   I have reviewed all of today's lab results. Relevant abnormalities are discussed in the A/P section   CXR: Low volumes, CM, vasc crowding, suspect interstitial edema   IMPRESSION: Acute on chronic resp failure Pulmonary edema Obesity Volume overload - presumed due to CHF  PLAN: Cont diuresis to extent permitted by BP and renal function Cont empiric BDs   PCCM will sign off. Please call if we can be of further assistance  Billy Fischer, MD ; [facility] [phone].  After 5:30 PM or weekends, call [phone]

## 2013-10-02 NOTE — Progress Notes (Signed)
  Echocardiogram 2D Echocardiogram has been performed.  Laura Griffith 10/02/2013, 9:43 AM

## 2013-10-02 NOTE — Progress Notes (Signed)
ANTICOAGULATION CONSULT NOTE - Initial Consult  Pharmacy Consult for coumadin Indication: atrial fibrillation  Allergies  Allergen Reactions  . Sulfamethoxazole Hives    Patient Measurements: Height: 5\' 3"  (160 cm) Weight: 255 lb 11.7 oz (116 kg) IBW/kg (Calculated) : 52.4  Vital Signs: Temp: 98.4 F (36.9 C) (11/14 0400) Temp src: Oral (11/14 0400) BP: 98/49 mmHg (11/14 1145) Pulse Rate: 101 (11/14 1145)  Labs:  Recent Labs  09/29/13 2127 09/30/13 0426  10/01/13 0246  10/01/13 0855 10/01/13 1625 10/01/13 1834 10/02/13 0435  HGB  --  7.4*  < >  --   < > 8.6* 8.4*  --  8.1*  HCT  --  24.9*  < >  --   < > 28.8* 27.6*  --  27.0*  PLT  --  110*  < >  --   < > 91* 102*  --  105*  LABPROT  --  22.5*  --  24.3*  --   --   --   --  18.0*  INR  --  2.05*  --  2.27*  --   --   --   --  1.53*  CREATININE 1.29* 1.42*  --  1.29*  --   --   --   --   --   TROPONINI  --   --   --   --   --   --   --  <0.30  --   < > = values in this interval not displayed.  Estimated Creatinine Clearance: 44.1 ml/min (by C-G formula based on Cr of 1.29).   Medical History: Past Medical History  Diagnosis Date  . Obesity   . Depression   . HTN (hypertension)   . Hyperlipemia   . CKD (chronic kidney disease), stage III     baseline 1.6-1.8 - Cr 0.7 (`00) 1.6  8/02  1.5 (2/03), Cr. 0.9  8/03 1.1 (11/05), Creatinine Clearance 8/02 about 50 ml/min MSE:  30/30 (09/2006)  . Aortic aneurysm     a. Followed by Dr. Dorris Fetch;  b. 01/2013 MR angio: 4.7 x 4.9 cm Asc Ao Aneurysm - stable.  . Atrial fibrillation     a. Noted initially at adenosine myoview 8/10 --> chronic atrial fibrillation.  . Femur fracture, left     a. 11/2-14 s/p IM nail  . DVT (deep venous thrombosis)     a. s/p IVC filter.  . Chronic diastolic CHF (congestive heart failure)     a. 09/2012 Echo: EF 50-55%, mild LVH, mild MR, mod dil LA, mild-mod TR, mildly increased PASP.  Marland Kitchen History of pulmonary embolism     a. s/p IVC  filter;  b. chronic coumadin  . Skin cancer of face   . H/O cardiovascular stress test     a. 12/2010, normal myoview.  . Osteoarthritis     Medications:  Prescriptions prior to admission  Medication Sig Dispense Refill  . Calcium Carbonate-Vitamin D (CALCIUM-VITAMIN D) 500-200 MG-UNIT per tablet Take 1 tablet by mouth 2 (two) times daily with a meal.  60 tablet  6  . cephALEXin (KEFLEX) 500 MG capsule Take 1 capsule (500 mg total) by mouth 3 (three) times daily.  30 capsule  0  . fish oil-omega-3 fatty acids 1000 MG capsule Take 2 capsules (2 g total) by mouth daily.  60 capsule  6  . hydrochlorothiazide (HYDRODIURIL) 25 MG tablet Take 25 mg by mouth daily.      Marland Kitchen losartan (COZAAR) 100 MG  tablet Take 100 mg by mouth daily.      . metoprolol succinate (TOPROL-XL) 50 MG 24 hr tablet Take 75 mg by mouth daily. Take with or immediately following a meal.      . oxybutynin (DITROPAN-XL) 5 MG 24 hr tablet Take 5 mg by mouth daily.      . rosuvastatin (CRESTOR) 10 MG tablet Take 10 mg by mouth daily.      . sertraline (ZOLOFT) 100 MG tablet Take 100 mg by mouth daily.      . traMADol (ULTRAM) 50 MG tablet Take 100 mg by mouth every 12 (twelve) hours as needed for moderate pain or severe pain.      . vitamin B-12 (CYANOCOBALAMIN) 1000 MCG tablet Take 1 tablet (1,000 mcg total) by mouth daily.  90 tablet  0  . warfarin (COUMADIN) 1 MG tablet Take 1-2 mg by mouth daily at 6 PM. Pt takes 2 mg every day except 1 mg on wed, fri       Scheduled:  . acetaminophen  650 mg Oral Q6H  . atorvastatin  20 mg Oral q1800  . furosemide  40 mg Intravenous BID  . levofloxacin  250 mg Oral Daily  . metoprolol tartrate  12.5 mg Oral BID  . sertraline  100 mg Oral Daily    Assessment: 79 yo female here  with L femure fracture s/p repair on 09/29/13. Patient on coumadin PTA for afib and has been on for concern of bleeding (Hg= 7.4 on 11/12; noted s/p 5mg  po vitamin K on 11/13). Coumadin to resume today; INR= 1.53,  Hg= 8.1, plt= 105. Last coumadin dose was 1mg  on 11/12.  home coumadin dose: 2mg  daily except 1mg  on W/F   Goal of Therapy:  INR 2-3 Monitor platelets by anticoagulation protocol: Yes   Plan:  -Coumadin 2mg  po today -Daily PT/INR  Harland German, Pharm D 10/02/2013 12:02 PM

## 2013-10-02 NOTE — Progress Notes (Signed)
Physical Therapy Treatment Patient Details Name: Mindie Rawdon MRN: [id number] DOB: 1933/05/10 Today's Date: 10/02/2013 Time: 4098-1191 PT Time Calculation (min): 25 min  PT Assessment / Plan / Recommendation  History of Present Illness Pt admitted after fall and fx of distal femur s/p IM nailing with post op drop in BP.   PT Comments   Pt continues to need +2 (A) for overall mobility and extra time to complete task.  Attempted to stand x 3 and unable to stand upright.  Pt will need lift to transfer OOB to recliner due to unsafe to complete transfer at this time especially with NWB right LE.     Follow Up Recommendations  SNF     Equipment Recommendations  Other (comment) (TBD)    Frequency Min 3X/week   Progress towards PT Goals Progress towards PT goals: Progressing toward goals  Plan Current plan remains appropriate    Precautions / Restrictions Precautions Precautions: Fall Restrictions Weight Bearing Restrictions: Yes LLE Weight Bearing: Non weight bearing   Pertinent Vitals/Pain Mild c/o left LE pain and does not rate    Mobility  Bed Mobility Bed Mobility: Supine to Sit;Sitting - Scoot to Edge of Bed;Sit to Supine Supine to Sit: 1: +2 Total assist;HOB elevated Supine to Sit: Patient Percentage: 10% Sitting - Scoot to Edge of Bed: 1: +2 Total assist Sitting - Scoot to Edge of Bed: Patient Percentage: 10% Sit to Supine: 1: +2 Total assist;HOB flat Sit to Supine: Patient Percentage: 10% Details for Bed Mobility Assistance: Assist for to provide truncal support and to bring LEs OOB. Pt unable to reach for bed rail due to UEs weakness and large body habitus. Use of chuck pad to pivot hips to EOB. Transfers Transfers: Sit to Stand;Stand to Sit Sit to Stand: 1: +2 Total assist;From bed Sit to Stand: Patient Percentage: 10% Stand to Sit: 1: +2 Total assist;To bed Stand to Sit: Patient Percentage: 10% Details for Transfer Assistance: +2 (A) to initiate transfer.  Pt  unable to stand completely upright with several attempts and with little assistance provided by pt.  Ambulation/Gait Ambulation/Gait Assistance: Not tested (comment)    Exercises General Exercises - Lower Extremity Ankle Circles/Pumps: AAROM;Both;10 reps   PT Diagnosis: Difficulty walking;Generalized weakness;Acute pain  PT Problem List: Decreased strength;Decreased activity tolerance;Decreased balance;Decreased mobility;Decreased cognition;Decreased knowledge of use of DME;Decreased safety awareness;Decreased knowledge of precautions;Pain;Obesity PT Treatment Interventions: DME instruction;Gait training;Functional mobility training;Therapeutic activities;Therapeutic exercise;Balance training;Patient/family education   PT Goals (current goals can now be found in the care plan section) Acute Rehab PT Goals Patient Stated Goal: to get stronger PT Goal Formulation: With patient Time For Goal Achievement: 10/15/13 Potential to Achieve Goals: Good  Visit Information  Last PT Received On: 10/02/13 Assistance Needed: +2 History of Present Illness: Pt admitted after fall and fx of distal femur s/p nailing with post op drop in BP.    Subjective Data  Subjective: I'm not sure about getting up.  Patient Stated Goal: to get stronger   Cognition  Cognition Arousal/Alertness: Awake/alert Behavior During Therapy: WFL for tasks assessed/performed Overall Cognitive Status: Within Functional Limits for tasks assessed    Balance  Balance Balance Assessed: Yes Static Sitting Balance Static Sitting - Balance Support: Bilateral upper extremity supported;Feet supported Static Sitting - Level of Assistance: 3: Mod assist;5: Stand by assistance Static Sitting - Comment/# of Minutes: Pt able to progress sitting EOB for ~ 10 minutes with cues for upright posture.  Pt with iniital mod (A) and able to progress to  minguard for safety.   End of Session PT - End of Session Equipment Utilized During  Treatment: Oxygen (2L) Activity Tolerance: Patient limited by fatigue;Patient limited by pain Patient left: in bed;with call bell/phone within reach Nurse Communication: Mobility status;Precautions   GP     Mikiala Fugett 10/02/2013, 12:48 PM  Jake Shark, PT DPT [phone]

## 2013-10-02 NOTE — Progress Notes (Signed)
Subjective:  Patient reports pain as mild, She seems to have some disorientation.   Objective:   VITALS:   Filed Vitals:   10/02/13 0400 10/02/13 0500 10/02/13 0600 10/02/13 0800  BP: 101/47 104/45 104/41   Pulse: 94 86 89   Temp: 98.4 F (36.9 C)     TempSrc: Oral     Resp: 22 21 21 22   Height:      Weight:  116 kg (255 lb 11.7 oz)    SpO2: 94% 93% 96% 93%    Physical Exam alert and oriented to person, place, date Face is symetric  Dressing: C/D/I  Compartments soft  SILT DP/SP/S/S/T, 2+DP, +TA/GS/EHL  LABS  Results for orders placed during the hospital encounter of 09/28/13 (from the past 24 hour(s))  CBC     Status: Abnormal   Collection Time    10/01/13  8:55 AM      Result Value Range   WBC 8.1  4.0 - 10.5 K/uL   RBC 3.28 (*) 3.87 - 5.11 MIL/uL   Hemoglobin 8.6 (*) 12.0 - 15.0 g/dL   HCT 40.9 (*) 81.1 - 91.4 %   MCV 87.8  78.0 - 100.0 fL   MCH 26.2  26.0 - 34.0 pg   MCHC 29.9 (*) 30.0 - 36.0 g/dL   RDW 78.2 (*) 95.6 - 21.3 %   Platelets 91 (*) 150 - 400 K/uL  CBC     Status: Abnormal   Collection Time    10/01/13  4:25 PM      Result Value Range   WBC 6.8  4.0 - 10.5 K/uL   RBC 3.17 (*) 3.87 - 5.11 MIL/uL   Hemoglobin 8.4 (*) 12.0 - 15.0 g/dL   HCT 08.6 (*) 57.8 - 46.9 %   MCV 87.1  78.0 - 100.0 fL   MCH 26.5  26.0 - 34.0 pg   MCHC 30.4  30.0 - 36.0 g/dL   RDW 62.9 (*) 52.8 - 41.3 %   Platelets 102 (*) 150 - 400 K/uL  LACTIC ACID, PLASMA     Status: None   Collection Time    10/01/13  6:34 PM      Result Value Range   Lactic Acid, Venous 2.0  0.5 - 2.2 mmol/L  TROPONIN I     Status: None   Collection Time    10/01/13  6:34 PM      Result Value Range   Troponin I <0.30  <0.30 ng/mL  PROTIME-INR     Status: Abnormal   Collection Time    10/02/13  4:35 AM      Result Value Range   Prothrombin Time 18.0 (*) 11.6 - 15.2 seconds   INR 1.53 (*) 0.00 - 1.49  CBC     Status: Abnormal   Collection Time    10/02/13  4:35 AM      Result Value  Range   WBC 7.0  4.0 - 10.5 K/uL   RBC 3.06 (*) 3.87 - 5.11 MIL/uL   Hemoglobin 8.1 (*) 12.0 - 15.0 g/dL   HCT 24.4 (*) 01.0 - 27.2 %   MCV 88.2  78.0 - 100.0 fL   MCH 26.5  26.0 - 34.0 pg   MCHC 30.0  30.0 - 36.0 g/dL   RDW 53.6 (*) 64.4 - 03.4 %   Platelets 105 (*) 150 - 400 K/uL     Assessment/Plan: 3 Days Post-Op   Principal Problem:   Femur fracture, left Active Problems:  HYPERLIPIDEMIA-MIXED   OBESITY, NOS   HYPERTENSION, BENIGN SYSTEMIC   PULMONARY HYPERTENSION   Atrial fibrillation   ANEURYSM, THORACIC AORTIC   RENAL INSUFFICIENCY, CHRONIC   OSTEOARTHRITIS, MULTI SITES   Anemia   Chronic diastolic CHF (congestive heart failure)   Acute on chronic diastolic heart failure   Acute pulmonary edema   Acute on chronic diastolic CHF (congestive heart failure), NYHA class 1   Hypotension   PLAN: Weight Bearing: NWB LLE Dressings: change prior to d/c VTE prophylaxis: SCD's, coumadin *d/c ASA as coumadin is therapeutic*  Dispo: SNF likely, ok for d/c from an orthopedic standpoint   Laura Griffith, D 10/02/2013, 8:10 AM   Margarita Rana, MD Cell [phone]

## 2013-10-02 NOTE — Progress Notes (Signed)
Orthopedic Tech Progress Note Patient Details:  Laura Griffith November 10, 1933 [id number]  Patient ID: Hermine Messick, female   DOB: February 27, 1933, 79 y.o.   MRN: [id number] Based on rn's assessment, pt is unable to use trapeze bar patient helper  Nikki Dom 10/02/2013, 1:23 PM

## 2013-10-03 LAB — CBC
HCT: 26.2 % — ABNORMAL LOW (ref 36.0–46.0)
MCHC: 31.3 g/dL (ref 30.0–36.0)
MCV: 87.3 fL (ref 78.0–100.0)
RBC: 3 MIL/uL — ABNORMAL LOW (ref 3.87–5.11)
RDW: 18.6 % — ABNORMAL HIGH (ref 11.5–15.5)
WBC: 5.2 10*3/uL (ref 4.0–10.5)

## 2013-10-03 LAB — BASIC METABOLIC PANEL
BUN: 30 mg/dL — ABNORMAL HIGH (ref 6–23)
Chloride: 100 mEq/L (ref 96–112)
Creatinine, Ser: 1.28 mg/dL — ABNORMAL HIGH (ref 0.50–1.10)
GFR calc Af Amer: 45 mL/min — ABNORMAL LOW (ref >90)
GFR calc non Af Amer: 39 mL/min — ABNORMAL LOW (ref >90)
Glucose, Bld: 113 mg/dL — ABNORMAL HIGH (ref 70–99)
Potassium: 3.1 mEq/L — ABNORMAL LOW (ref 3.5–5.1)
Sodium: 141 mEq/L (ref 135–145)

## 2013-10-03 LAB — PROTIME-INR
INR: 1.16 (ref 0.00–1.49)
Prothrombin Time: 14.6 seconds (ref 11.6–15.2)

## 2013-10-03 MED ORDER — FUROSEMIDE 80 MG PO TABS
80.0000 mg | ORAL_TABLET | Freq: Three times a day (TID) | ORAL | Status: DC
  Administered 2013-10-03 – 2013-10-04 (×5): 80 mg via ORAL
  Filled 2013-10-03 (×8): qty 1

## 2013-10-03 MED ORDER — FUROSEMIDE 40 MG PO TABS
40.0000 mg | ORAL_TABLET | Freq: Two times a day (BID) | ORAL | Status: DC
  Filled 2013-10-03 (×2): qty 1

## 2013-10-03 MED ORDER — WARFARIN SODIUM 3 MG PO TABS
3.0000 mg | ORAL_TABLET | Freq: Once | ORAL | Status: AC
  Administered 2013-10-03: 3 mg via ORAL
  Filled 2013-10-03: qty 1

## 2013-10-03 MED ORDER — POTASSIUM CHLORIDE CRYS ER 20 MEQ PO TBCR
30.0000 meq | EXTENDED_RELEASE_TABLET | Freq: Three times a day (TID) | ORAL | Status: AC
  Administered 2013-10-03 (×3): 30 meq via ORAL
  Filled 2013-10-03 (×4): qty 1

## 2013-10-03 NOTE — Progress Notes (Signed)
Pt. Transferred to 4E.  Report given to RN Teena; pt's vss at time of transfer.  Belongings with patient.   Vivi Martens RN

## 2013-10-03 NOTE — Progress Notes (Signed)
ANTICOAGULATION CONSULT NOTE - follow up Pharmacy Consult for coumadin Indication: atrial fibrillation  Allergies  Allergen Reactions  . Sulfamethoxazole Hives    Patient Measurements: Height: 5\' 3"  (160 cm) Weight: 255 lb 11.7 oz (116 kg) IBW/kg (Calculated) : 52.4  Vital Signs: Temp: 99.1 F (37.3 C) (11/15 0800) Temp src: Oral (11/15 0800) BP: 104/50 mmHg (11/15 1400) Pulse Rate: 91 (11/15 1400)  Labs:  Recent Labs  10/01/13 0246  10/01/13 1625 10/01/13 1834 10/02/13 0435 10/03/13 0523  HGB  --   < > 8.4*  --  8.1* 8.2*  HCT  --   < > 27.6*  --  27.0* 26.2*  PLT  --   < > 102*  --  105* 112*  LABPROT 24.3*  --   --   --  18.0* 14.6  INR 2.27*  --   --   --  1.53* 1.16  CREATININE 1.29*  --   --   --   --  1.28*  TROPONINI  --   --   --  <0.30  --   --   < > = values in this interval not displayed.  Estimated Creatinine Clearance: 44.5 ml/min (by C-G formula based on Cr of 1.28).   Assessment: 79 yo female admitted with L femur fracture s/p repair on 09/29/13. Patient on coumadin PTA for afib and it was held for concern of bleeding (Hg= 7.4 on 11/12; noted s/p 5mg  po vitamin K on 11/13). Coumadin resumed 11/14.  INR= 1.16 today.  Hg= 8.2, plt= 112. INR baseline due to vitamin K 5 mg po on 11/13. On levaquin day # 4 for enterobacter UTI.  home coumadin dose: 2mg  daily except 1mg  on W/F   Goal of Therapy:  INR 2-3 Plan:  -Coumadin 3 mg po today -Daily PT/INR  Herby Abraham, Pharm.D. [phone] 10/03/2013 3:03 PM

## 2013-10-03 NOTE — Progress Notes (Signed)
Paged MD regarding patient's Code Status.  Admission note says DNI, but patient is a Full Code.  Stated he would speak with the family and patient regarding code status.  Will continue to monitor.  Vivi Martens RN

## 2013-10-03 NOTE — Significant Event (Signed)
Received transfer nontele patient from [address] [place]. A&OX4 96/47, TEMP 100.1 no acute distress noted lt leg ace wrap and immobilizer inplace. oriented to room.

## 2013-10-03 NOTE — Progress Notes (Addendum)
Attending Addendum  I examined the patient and discussed the assessment and plan with Dr. Gayla Doss. I have reviewed the note and agree.  Patient still with low BPs. Mild wheezing on lung exam. Normal cognition, excellent UO, no CP or SOB. Minimal physical effort during exam.  Plan to transfer out of stepdown to tele. Consulted CIR for dispo, I fear that she will refuse SNF placement despite her poor functional capacity. She is NWB on LLE. Still up 7 # from admission weight, euvolemic on exam and based on Is/Os since admission. Changing lasix to 80 mg PO TID.   Needs one more day of Levaquin and additional diuresis prior to discharge. Nearing being medically stable for discharge in the next 1-2 days.  Checking Vit D and phos on tomorrow AM labs. Will f/u decision regarding SNF as well as CIR evaluation.     Dessa Phi, MD FAMILY MEDICINE TEACHING SERVICE

## 2013-10-03 NOTE — Progress Notes (Signed)
Family Medicine Teaching Service Daily Progress Note Intern Pager: [phone]  Patient name: Laura Griffith Medical record number: [id number] Date of birth: Sep 04, 1933 Age: 79 y.o. Gender: female  Primary Care Provider: Tawni Carnes, MD Consultants: Ortho Code Status:   Pt Overview and Major Events to Date:  11/10: Femur fx  11/11: Surgery today  Assessment and Plan: Laura Griffith is a 79 y.o. female presenting with a femur fracture. PMH is significant for pulmonary hypertension, hypertension, dCHF and atrial fibrillation   #Femur fracture:   Follow-up orthopedic recommendations for surgery   INTRAMEDULLARY (IM) RETROGRADE FEMORAL NAILING  Pain control: tylenol scheduled; Vicodin prn  PT/OT : Supervision/Assistance - 24 hour;SNF   SW consulted to help with placement  # Combined Acute Blood Loss and Dilutional Anemia - Multiple fluid boluses postoperatively for hypotension  - 1 unit PCs ordered 09/30/13  - Admission H&H 11.0/37.6  - Lowest Postop H&H 7.4/24.9 Hgb: 8.3 > 8.2 (11/15), Plts 85 > 112 (11/15); post 3u RBCs Warfarin restarted 11/14 BP stable; Pt clinically improving  # Hypotension: possible due to inaccurate cuff readings BP last 24:(94-110)/(46-57) 98/48 mmHg   Holding hctz and losartan  AM Cortisol: 14.5  Lactic acid: wnl  Trop neg  #Atrial fibrillation:  Currently in Afib w/o RVR; Consult Cardiology   metoprolol Inc to 12.5 q6hrs per Cardiololgy INR of 1.16  Warfarin per pharm; holding ASA 325  # CKD stage 3  - Cr 1.34 on admit; Cr improved 1.28 (11/15) - Rehydrate and monitor  #SOB Wheezing: HxHF no known history of asthma or COPD. Does not use an albuterol inhaler at home.  levalbuterol nebulizer q8hours for wheezing [place] 2L O2 Tachypnea improving  ECHO Normal LV size with mild global hypokinesis, EF 50%. There was septal-lateral dyssynchrony suggestive of bundle branch block. The RV was moderately dilated with normal systolic  function.  CXR 11/14: Improving mild pulmonary interstitial edema. Incentive spirometry Lasix 40mb BID PO; Foley placed UOP 3L in last 24hrs (Net +0.8L); Wt 255 11/14 (247 admit) Continue to monitor fluid status  # Delirium: Resolved - A&Ox4 this morning - Continue to assess; treat pain and orient often  # UTI - POA on Keflex (started 11/7); CTX given in ED - UA: Leu (+); Nit (-) - Culture: > 100,000: Enterobacter sensitive to Levofloxacin - Pt is afebile and asymptomatic, WBC wnl ; - Continue Levaquin Day 4; total Abx day 6  FEN/GI: Heart diet - KVO Prophylaxis:Warfarin per pharm   Disposition: transfer to floor; dispo pending PT IR recs  Subjective:  She reports feeling better today, with improved breathing. She denies any Leg pain "as long as i don't move", CP, SOB or palpitations at this time. Denies any UTI symptoms.   Objective: Temp:  [98.1 F (36.7 C)-99.1 F (37.3 C)] 99.1 F (37.3 C) (11/15 0800) Pulse Rate:  [86-101] 88 (11/15 0600) Resp:  [18-29] 21 (11/15 0600) BP: (94-110)/(46-57) 98/48 mmHg (11/15 0600) SpO2:  [91 %-96 %] 93 % (11/15 0600) Physical Exam: General: Laying in bed in no acute distress  Cardiovascular: Afib w/o RVR, no murmurs, rubs or gallops, distant heart sounds due to body habitus  Respiratory: Mild Wheezes and crackles throughout  Abdomen: soft, obese, non-tender  Extremities: moderate  edema bilaterally LE, non-pitting, 2+ pedal pulses: sensation intact b/l;  Skin: ecchymosis on left forearm from venipuncture  Neuro: Alert, oriented x4  Laboratory:  Recent Labs Lab 10/01/13 1625 10/02/13 0435 10/03/13 0523  WBC 6.8 7.0 5.2  HGB 8.4* 8.1* 8.2*  HCT 27.6* 27.0* 26.2*  PLT 102* 105* 112*    Recent Labs Lab 09/30/13 0426 10/01/13 0246 10/03/13 0523  NA 142 138 141  K 3.5 3.5 3.1*  CL 104 101 100  CO2 31 28 31   BUN 38* 34* 30*  CREATININE 1.42* 1.29* 1.28*  CALCIUM 8.5 8.3* 8.4  PROT  --  5.6*  --   BILITOT  --  0.4   --   ALKPHOS  --  51  --   ALT  --  <5  --   AST  --  20  --   GLUCOSE 109* 108* 113*   Imaging/Diagnostic Tests: CT Knee  - There is an impacted displaced and comminuted fracture through the  distal left femur. Approximately 14 mm of overlap/impaction.  Approximately 10 mm posterior displacement. There is also mild  medial displacement of the distal fracture fragments. Moderate joint  effusion.  DG Femur IMPRESSION:  The patient has sustained an acute comminuted slightly angulated  displaced fracture of the distal left femoral metadiaphysis.   CXR IMPRESSION:  The findings suggest low-grade compensated CHF. There is no  significant interstitial or alveolar edema.   [name], MD 10/03/2013, 9:10 AM PGY-1, [facility] Family Medicine FPTS Intern pager: [phone], text pages welcome

## 2013-10-03 NOTE — Progress Notes (Signed)
Subjective:  Still mild wheezing. No acute dyspnea or chest pain.  Objective:  Vital Signs in the last 24 hours: Temp:  [98.1 F (36.7 C)-99.1 F (37.3 C)] 99.1 F (37.3 C) (11/15 0800) Pulse Rate:  [86-103] 86 (11/15 1200) Resp:  [18-29] 22 (11/15 1200) BP: (91-110)/(37-68) 110/68 mmHg (11/15 1200) SpO2:  [91 %-97 %] 97 % (11/15 1200)  Intake/Output from previous day: 11/14 0701 - 11/15 0700 In: 930 [P.O.:700; I.V.:230] Out: 3000 [Urine:3000] Intake/Output from this shift: Total I/O In: -  Out: 725 [Urine:725]  . acetaminophen  650 mg Oral Q6H  . atorvastatin  20 mg Oral q1800  . furosemide  40 mg Oral BID  . levofloxacin  250 mg Oral Daily  . metoprolol tartrate  12.5 mg Oral Q6H  . potassium chloride  30 mEq Oral TID  . sertraline  100 mg Oral Daily  . Warfarin - Pharmacist Dosing Inpatient   Does not apply q1800   . lactated ringers 10 mL/hr at 10/03/13 0000    Physical Exam: The patient appears to be in no distress.  Head and neck exam reveals that the pupils are equal and reactive.  The extraocular movements are full.  There is no scleral icterus.  Mouth and pharynx are benign.  No lymphadenopathy.  No carotid bruits.  The jugular venous pressure is normal.  Thyroid is not enlarged or tender.  Chest ireveals expiratory wheezesl Heart reveals no abnormal lift or heave.  First and second heart sounds are normal.  There is no murmur gallop rub or click. Pulse irregular.  The abdomen is soft and nontender.  Bowel sounds are normoactive.  There is no hepatosplenomegaly or mass.  There are no abdominal bruits.  Extremities reveal no phlebitis or edema.  Pedal pulses are good.  There is no cyanosis or clubbing.    Integument reveals no rash  Lab Results:  Recent Labs  10/02/13 0435 10/03/13 0523  WBC 7.0 5.2  HGB 8.1* 8.2*  PLT 105* 112*    Recent Labs  10/01/13 0246 10/03/13 0523  NA 138 141  K 3.5 3.1*  CL 101 100  CO2 28 31  GLUCOSE 108*  113*  BUN 34* 30*  CREATININE 1.29* 1.28*    Recent Labs  10/01/13 1834  TROPONINI <0.30   Hepatic Function Panel  Recent Labs  10/01/13 0246  PROT 5.6*  ALBUMIN 2.3*  AST 20  ALT <5  ALKPHOS 51  BILITOT 0.4  BILIDIR 0.1  IBILI 0.3   No results found for this basename: CHOL,  in the last 72 hours No results found for this basename: PROTIME,  in the last 72 hours  Imaging: Dg Chest Portable 1 View  10/02/2013   CLINICAL DATA:  Shortness of breath  EXAM: PORTABLE CHEST - 1 VIEW  COMPARISON:  10/01/2013  FINDINGS: Cardiomegaly is again noted. The lungs are poorly aerated when compared with the prior exam. Some crowding of the vascular markings is seen. No focal confluent infiltrate is seen. The degree of vascular congestion and pulmonary edema has improved in the interval.  IMPRESSION: Poor inspiratory effort.  Improvement of vascular congestion when compare with the prior study.   Electronically Signed   By: Alcide Clever M.D.   On: 10/02/2013 07:39    Cardiac Studies: 2D echo 10/02/13: - Left ventricle: The cavity size was normal. Wall thickness was normal. The estimated ejection fraction was 50%. Septal-lateral dyssynchrony suggesting bundle branch block. Indeterminant diastolic function (atrial  fibrillation). - Ventricular septum: The interventricular septum was D-shaped in systole and diastole, suggesting RV pressure/volume overload. - Aortic valve: There was no stenosis. Mild regurgitation. - Mitral valve: Mildly calcified annulus. Normal thickness leaflets . - Left atrium: The atrium was mildly dilated. - Right ventricle: The cavity size was moderately dilated. Systolic function was normal. - Right atrium: The atrium was mildly to moderately dilated. - Tricuspid valve: Peak RV-RA gradient: 34mm Hg (S). - Pulmonary arteries: PA peak pressure: 54mm Hg (S). - Systemic veins: IVC measured 2.6 cm with minimal respirophasic variation, suggesting RA pressure 20  mmHg. Impressions:  - The patient was in atrial fibrillation. Normal LV size with mild global hypokinesis, EF 50%. There was septal-lateral dyssynchrony suggestive of bundle branch block. The RV was moderately dilated with normal systolic function. The interventricular septum was D-shaped, suggesting RV pressure/volume overload. Moderate pulmonary HTN. Dilated IVC suggested elevated RV filling pressure.  Assessment/Plan:  79 yo with chronic atrial fibrillation, chronic diastolic CHF, and ascending aortic aneurysm presented with femur fracture and developed post-op hypotension and acute/chronic diastolic CHF.  1. Atrial fibrillation: Rate in 90s. She has chronic atrial fibrillation. BP soft but stable for now given metroprolol 12.5 mg every 6 hrs. Continue warfarin.  2. Acute/chronic diastolic CHF: She remains volume overloaded on exam. Increase Lasix to 40 mg IV every 8 hrs today, monitor I/Os closely.  Hypokalemic today. Potassium repletion per primary service. 3. I am not sure that she is going to do well at home. At baseline, she is not very mobile. Would consider SNF but not sure she would agree.    LOS: 5 days    Cassell Clement 10/03/2013, 1:11 PM

## 2013-10-04 DIAGNOSIS — M1990 Unspecified osteoarthritis, unspecified site: Secondary | ICD-10-CM

## 2013-10-04 LAB — CBC
MCHC: 29.6 g/dL — ABNORMAL LOW (ref 30.0–36.0)
MCV: 88.9 fL (ref 78.0–100.0)
Platelets: 149 10*3/uL — ABNORMAL LOW (ref 150–400)
RBC: 3.42 MIL/uL — ABNORMAL LOW (ref 3.87–5.11)
RDW: 19.1 % — ABNORMAL HIGH (ref 11.5–15.5)
WBC: 5.8 10*3/uL (ref 4.0–10.5)

## 2013-10-04 LAB — BASIC METABOLIC PANEL
Calcium: 8.5 mg/dL (ref 8.4–10.5)
Calcium: 8.6 mg/dL (ref 8.4–10.5)
Creatinine, Ser: 1.15 mg/dL — ABNORMAL HIGH (ref 0.50–1.10)
GFR calc Af Amer: 51 mL/min — ABNORMAL LOW (ref >90)
GFR calc Af Amer: 51 mL/min — ABNORMAL LOW (ref >90)
GFR calc non Af Amer: 44 mL/min — ABNORMAL LOW (ref >90)
GFR calc non Af Amer: 44 mL/min — ABNORMAL LOW (ref >90)
Sodium: 143 mEq/L (ref 135–145)
Sodium: 141 mEq/L (ref 135–145)

## 2013-10-04 LAB — PROTIME-INR
INR: 1.13 (ref 0.00–1.49)
Prothrombin Time: 14.3 seconds (ref 11.6–15.2)

## 2013-10-04 LAB — PHOSPHORUS: Phosphorus: 1.1 mg/dL — ABNORMAL LOW (ref 2.3–4.6)

## 2013-10-04 MED ORDER — POTASSIUM CHLORIDE CRYS ER 20 MEQ PO TBCR
40.0000 meq | EXTENDED_RELEASE_TABLET | Freq: Three times a day (TID) | ORAL | Status: AC
  Administered 2013-10-04 (×3): 40 meq via ORAL
  Filled 2013-10-04 (×2): qty 2

## 2013-10-04 MED ORDER — METOPROLOL SUCCINATE ER 50 MG PO TB24
75.0000 mg | ORAL_TABLET | Freq: Every day | ORAL | Status: DC
  Administered 2013-10-04 – 2013-10-06 (×3): 75 mg via ORAL
  Filled 2013-10-04 (×3): qty 1

## 2013-10-04 MED ORDER — WARFARIN SODIUM 4 MG PO TABS
4.0000 mg | ORAL_TABLET | Freq: Once | ORAL | Status: AC
  Administered 2013-10-04: 18:00:00 4 mg via ORAL
  Filled 2013-10-04: qty 1

## 2013-10-04 MED ORDER — SODIUM PHOSPHATE 3 MMOLE/ML IV SOLN
30.0000 mmol | Freq: Once | INTRAVENOUS | Status: AC
  Administered 2013-10-04: 30 mmol via INTRAVENOUS
  Filled 2013-10-04: qty 10

## 2013-10-04 NOTE — Progress Notes (Signed)
Patient ID: Laura Griffith, female   DOB: 1933/07/18, 79 y.o.   MRN: [id number]   SUBJECTIVE: Switched from IV Lasix to po yesterday but continued to diurese well.  K is low.  Feels "ok."   . acetaminophen  650 mg Oral Q6H  . atorvastatin  20 mg Oral q1800  . furosemide  80 mg Oral TID  . metoprolol tartrate  12.5 mg Oral Q6H  . potassium chloride  40 mEq Oral TID  . sertraline  100 mg Oral Daily  . warfarin  4 mg Oral ONCE-1800  . Warfarin - Pharmacist Dosing Inpatient   Does not apply q1800      Filed Vitals:   10/03/13 2011 10/04/13 0100 10/04/13 0612 10/04/13 0615  BP: 101/67 139/65 119/68   Pulse: 89 112 93   Temp: 98.8 F (37.1 C)  98.1 F (36.7 C)   TempSrc: Oral  Oral   Resp: 20  19   Height:      Weight:    241 lb 13.5 oz (109.7 kg)  SpO2: 95%  96%     Intake/Output Summary (Last 24 hours) at 10/04/13 1239 Last data filed at 10/04/13 1235  Gross per 24 hour  Intake    582 ml  Output   3550 ml  Net  -2968 ml    LABS: Basic Metabolic Panel:  Recent Labs  40/98/11 0523 10/04/13 0430  NA 141 143  K 3.1* 2.9*  CL 100 97  CO2 31 38*  GLUCOSE 113* 107*  BUN 30* 26*  CREATININE 1.28* 1.15*  CALCIUM 8.4 8.5  PHOS  --  1.1*   Liver Function Tests: No results found for this basename: AST, ALT, ALKPHOS, BILITOT, PROT, ALBUMIN,  in the last 72 hours No results found for this basename: LIPASE, AMYLASE,  in the last 72 hours CBC:  Recent Labs  10/03/13 0523 10/04/13 0430  WBC 5.2 5.8  HGB 8.2* 9.0*  HCT 26.2* 30.4*  MCV 87.3 88.9  PLT 112* 149*   Cardiac Enzymes:  Recent Labs  10/01/13 1834  TROPONINI <0.30   BNP: No components found with this basename: POCBNP,  D-Dimer: No results found for this basename: DDIMER,  in the last 72 hours Hemoglobin A1C: No results found for this basename: HGBA1C,  in the last 72 hours Fasting Lipid Panel: No results found for this basename: CHOL, HDL, LDLCALC, TRIG, CHOLHDL, LDLDIRECT,  in the last 72  hours Thyroid Function Tests: No results found for this basename: TSH, T4TOTAL, FREET3, T3FREE, THYROIDAB,  in the last 72 hours Anemia Panel: No results found for this basename: VITAMINB12, FOLATE, FERRITIN, TIBC, IRON, RETICCTPCT,  in the last 72 hours  RADIOLOGY: Dg Chest 2 View  09/28/2013   CLINICAL DATA:  Cough status post fall.  EXAM: CHEST  2 VIEW  COMPARISON:  None.  FINDINGS: The cardiac silhouette is enlarged. The pulmonary vascularity is mildly prominent centrally but there is no definite cephalization of flow. There is tortuosity of the descending thoracic aorta. No pleural effusion is evident. There is no pneumothorax nor pneumomediastinum. There are degenerative changes of both shoulders.  IMPRESSION: The findings suggest low-grade compensated CHF. There is no significant interstitial or alveolar edema.   Electronically Signed   By: David  [place]   On: 09/28/2013 11:35   Dg Femur Left  09/30/2013   CLINICAL DATA:  Postop left femur.  EXAM: LEFT FEMUR - 2 VIEW  COMPARISON:  09/29/2013.  FINDINGS: Left femoral intra medullary rod and distal  left femoral surgical screws are present. Comminuted distal femoral fractures are present. These appears in near anatomic alignment on today's exam. Surgical hardware noted of the proximal tibia. Degenerative changes left hip. Surgical clips left upper thigh. Calcifications in the pelvis consistent with phleboliths.  IMPRESSION: Patient status post open reduction internal fixation left distal femoral fracture with good anatomic alignment.   Electronically Signed   By: Maisie Fus  Register   On: 09/30/2013 16:04   Dg Femur Left  09/29/2013   CLINICAL DATA:  Fracture fixation  EXAM: LEFT FEMUR - 2 VIEW; DG C-ARM 1-60 MIN  COMPARISON:  09/28/2013  FINDINGS: Comminuted fracture distal femur has been fixed with a locking intra medullary nail. Mild displacement of the fracture. Hardware in satisfactory position.  There is a metal staple in the lateral tibial  plateau  IMPRESSION: Comminuted fracture distal femur fixed with a locking intra medullary nail. Mild fracture displacement is noted.   Electronically Signed   By: Marlan Palau M.D.   On: 09/29/2013 17:22   Dg Femur Left  09/28/2013   CLINICAL DATA:  Pain status post fall.  EXAM: LEFT FEMUR - 2 VIEW  COMPARISON:  None.  FINDINGS: The patient has sustained an acute comminuted displaced fracture of the distal left femoral metadiaphysis. The femoral shaft is laterally positioned with respect to the distal femoral metaphysis. More proximally the femoral shaft and the visualized for portions of the femoral head and neck and intertrochanteric regions are grossly normal. There is high-grade narrowing of the medial and lateral joint compartments of the left knee. The patient has undergone previous fixation of a lateral tibial plateau fracture.  IMPRESSION: The patient has sustained an acute comminuted slightly angulated displaced fracture of the distal left femoral metadiaphysis.   Electronically Signed   By: David  [place]   On: 09/28/2013 11:41   Ct Knee Left Wo Contrast  09/28/2013   CLINICAL DATA:  Fall, leg pain.  EXAM: CT OF THE LEFT KNEE WITHOUT CONTRAST  TECHNIQUE: Multidetector CT imaging was performed according to the standard protocol. Multiplanar CT image reconstructions were also generated.  COMPARISON:  Plain films earlier today.  FINDINGS: There is an impacted displaced and comminuted fracture through the distal left femur. Approximately 14 mm of overlap/impaction. Approximately 10 mm posterior displacement. There is also mild medial displacement of the distal fracture fragments. Moderate joint effusion.  Advanced degenerative changes within the left knee with near complete joint space loss, exuberant osteophyte formation and subchondral sclerosis. Hardware rash that remote hardware within the proximal tibia.  IMPRESSION: Impacted, comminuted and displaced distal femoral fracture as described above.   Advanced osteoarthritis of the left knee.   Electronically Signed   By: Charlett Nose M.D.   On: 09/28/2013 18:46   Dg Chest Portable 1 View  10/02/2013   CLINICAL DATA:  Shortness of breath  EXAM: PORTABLE CHEST - 1 VIEW  COMPARISON:  10/01/2013  FINDINGS: Cardiomegaly is again noted. The lungs are poorly aerated when compared with the prior exam. Some crowding of the vascular markings is seen. No focal confluent infiltrate is seen. The degree of vascular congestion and pulmonary edema has improved in the interval.  IMPRESSION: Poor inspiratory effort.  Improvement of vascular congestion when compare with the prior study.   Electronically Signed   By: Alcide Clever M.D.   On: 10/02/2013 07:39   Dg Chest Port 1 View  10/01/2013   CLINICAL DATA:  Shortness of breath and wheezing.  EXAM: PORTABLE CHEST - 1  VIEW  COMPARISON:  09/28/2013.  FINDINGS: Persistent cardiomegaly is present. Mild pulmonary venous prominence is present. Progressive bilateral mild interstitial prominence present. These findings suggest progressive congestive heart failure with mild interstitial edema. No focal alveolar infiltrate. No pneumothorax. Degenerative changes both shoulders.  IMPRESSION: Progressive congestive heart failure with mild pulmonary interstitial edema.   Electronically Signed   By: Maisie Fus  Register   On: 10/01/2013 10:14   Dg C-arm 1-60 Min  09/29/2013   CLINICAL DATA:  Fracture fixation  EXAM: LEFT FEMUR - 2 VIEW; DG C-ARM 1-60 MIN  COMPARISON:  09/28/2013  FINDINGS: Comminuted fracture distal femur has been fixed with a locking intra medullary nail. Mild displacement of the fracture. Hardware in satisfactory position.  There is a metal staple in the lateral tibial plateau  IMPRESSION: Comminuted fracture distal femur fixed with a locking intra medullary nail. Mild fracture displacement is noted.   Electronically Signed   By: Marlan Palau M.D.   On: 09/29/2013 17:22    PHYSICAL EXAM General: NAD Neck: JVP  8-9 cm, no thyromegaly or thyroid nodule.  Lungs: Dependent crackles CV: Nondisplaced PMI.  Heart irregular S1/S2, no S3/S4, no murmur.  Trace right ankle edema.  Abdomen: Soft, nontender, no hepatosplenomegaly, no distention.  Neurologic: Alert and oriented x 3.  Psych: Normal affect. Extremities: No clubbing or cyanosis.   TELEMETRY: Reviewed telemetry pt in atrial fibrillation, 90s  ASSESSMENT AND PLAN: 79 yo with chronic atrial fibrillation, chronic diastolic CHF, and ascending aortic aneurysm presented with femur fracture and developed post-op hypotension and acute/chronic diastolic CHF.  1. Atrial fibrillation: She has chronic atrial fibrillation. She can restart her home regimen of Toprol XL 75 mg daily.  Continue coumadin.  2. Acute/chronic diastolic CHF: She diuresed well but volume still up.  Looks like she was switched to high dose po Lasix yesterday.  Since she diuresed well with this, will continue today and then I will likely lower the dose tomorrow to a more chronic regimen.  Needs aggressive K replacement. Will get pm BMET.  3. I am not sure that she is going to do well at home. At baseline, she is not very mobile. Would consider SNF but not sure she would agree.   [name] 10/04/2013 12:43 PM

## 2013-10-04 NOTE — Progress Notes (Signed)
ANTICOAGULATION CONSULT NOTE - follow up Pharmacy Consult for coumadin Indication: atrial fibrillation  Allergies  Allergen Reactions  . Sulfamethoxazole Hives    Patient Measurements: Height: 5\' 3"  (160 cm) (stated by patient ) Weight: 241 lb 13.5 oz (109.7 kg) IBW/kg (Calculated) : 52.4  Vital Signs: Temp: 98.1 F (36.7 C) (11/16 0612) Temp src: Oral (11/16 0612) BP: 119/68 mmHg (11/16 0612) Pulse Rate: 93 (11/16 0612)  Labs:  Recent Labs  10/01/13 1834 10/02/13 0435 10/03/13 0523 10/04/13 0430  HGB  --  8.1* 8.2* 9.0*  HCT  --  27.0* 26.2* 30.4*  PLT  --  105* 112* 149*  LABPROT  --  18.0* 14.6 14.3  INR  --  1.53* 1.16 1.13  CREATININE  --   --  1.28* 1.15*  TROPONINI <0.30  --   --   --     Estimated Creatinine Clearance: 47.9 ml/min (by C-G formula based on Cr of 1.15).   Assessment: 79 yo female on coumadin PTA for afib and it was held for concern of bleeding (Hg= 7.4 on 11/12; noted s/p 5mg  po vitamin K on 11/13).   Coumadin resumed 11/14.  INR= 1.13 today -likely low due to vitamin K resistance.  Levaquin to stop today.  Hg= 9, plt= 149 (increased).  home coumadin dose: 2mg  daily except 1mg  on W/F  Goal of Therapy:  INR 2-3 Plan:  -Coumadin 4 mg po today -Daily PT/INR  Link Snuffer, PharmD, BCPS Clinical Pharmacist [phone] 10/04/2013 11:34 AM

## 2013-10-04 NOTE — Progress Notes (Signed)
Family Medicine Teaching Service Daily Progress Note Intern Pager: [phone]  Patient name: Laura Griffith Medical record number: [id number] Date of birth: May 21, 1933 Age: 79 y.o. Gender: female  Primary Care Provider: Tawni Carnes, MD Consultants: Ortho Code Status: presume full  Pt Overview and Major Events to Date:  11/10: Femur fx  11/11: operative repair of femur fx, post op hypotension requiring transfer to stepdown  Assessment and Plan: Laura Griffith is a 79 y.o. female presenting with a femur fracture. PMH is significant for pulmonary hypertension, hypertension, dCHF and atrial fibrillation   #Femur fracture:   S/p operative repair on 11/11 Pain control: tylenol scheduled; Vicodin prn  PT/OT : Supervision/Assistance - 24 hour;SNF   SW consulted to help with placement  # Combined Acute Blood Loss and Dilutional Anemia - Multiple fluid boluses postoperatively for hypotension  - Admission H&H 11.0/37.6  - Lowest Postop H&H 7.4/24.9 Hgb & platelets improving Warfarin (for afib) restarted 11/14, INR remains subtherapeutic BP stable; Pt clinically improving  # Hypotension: possibly due to inaccurate cuff readings BP last 24:(94-139)/(47-68)  Holding hctz and losartan  AM Cortisol: 14.5  Lactic acid: wnl  Trop neg  #Atrial fibrillation:  Currently in Afib w/o RVR;  Cardiology consulted   metoprolol Inc to 12.5 q6hrs per Cardiololgy subtherapeutic INR in low 1's  Warfarin per pharm; holding ASA 325  # CKD stage 3  - Cr 1.34 on admit; Cr stable - creatinine tolerating diuresis  #SOB Wheezing: HxHF no known history of asthma or COPD. Does not use an albuterol inhaler at home.  levalbuterol nebulizer q8hours for wheezing Laura Griffith 2L O2 Tachypnea improving  ECHO Normal LV size with mild global hypokinesis, EF 50%. There was septal-lateral dyssynchrony suggestive of bundle branch block. The RV was moderately dilated with normal systolic function.  CXR 11/14:  Improving mild pulmonary interstitial edema. Incentive spirometry Lasix 40mg  IV q8h as of 11/15 UOP 4.8L in last 24hrs (Net negative); Wt 242lb (247 admit), has foley for I&O charting Continue to monitor fluid status  # Delirium: Resolved - A&Ox4 this morning - Continue to assess; treat pain and orient often  # UTI - POA on Keflex (started 11/7); CTX given in ED - UA: Leu (+); Nit (-) - Culture: > 100,000: Enterobacter sensitive to Levofloxacin - levaquin day 6/6 today  FEN/GI: Heart diet - KVO Prophylaxis: Warfarin per pharm   Disposition: transfer to floor; dispo pending PT IR recs  Subjective:  No complaints today.   Objective: Temp:  [98.1 F (36.7 C)-100.1 F (37.8 C)] 98.1 F (36.7 C) (11/16 0612) Pulse Rate:  [85-112] 93 (11/16 0612) Resp:  [19-25] 19 (11/16 0612) BP: (94-139)/(47-68) 119/68 mmHg (11/16 0612) SpO2:  [92 %-97 %] 96 % (11/16 0612) Weight:  [241 lb 13.5 oz (109.7 kg)-254 lb 3.1 oz (115.3 kg)] 241 lb 13.5 oz (109.7 kg) (11/16 1478) Physical Exam: General: Sitting up in chair in no acute distress  Cardiovascular: Afib w/o RVR, no murmurs, rubs or gallops, distant heart sounds due to body habitus  Respiratory: CTAB via anterior auscultation, NWOB Abdomen: soft, obese, non-tender  Extremities: R calf has SCD in place, nontender to palpation Skin: ecchymosis on left forearm from venipuncture  Neuro: Alert, follows commands  Laboratory:  Recent Labs Lab 10/02/13 0435 10/03/13 0523 10/04/13 0430  WBC 7.0 5.2 5.8  HGB 8.1* 8.2* 9.0*  HCT 27.0* 26.2* 30.4*  PLT 105* 112* 149*    Recent Labs Lab 10/01/13 0246 10/03/13 0523 10/04/13 0430  NA 138 141 143  K 3.5 3.1* 2.9*  CL 101 100 97  CO2 28 31 38*  BUN 34* 30* 26*  CREATININE 1.29* 1.28* 1.15*  CALCIUM 8.3* 8.4 8.5  PROT 5.6*  --   --   BILITOT 0.4  --   --   ALKPHOS 51  --   --   ALT <5  --   --   AST 20  --   --   GLUCOSE 108* 113* 107*   Imaging/Diagnostic Tests: CT Knee  -  There is an impacted displaced and comminuted fracture through the  distal left femur. Approximately 14 mm of overlap/impaction.  Approximately 10 mm posterior displacement. There is also mild  medial displacement of the distal fracture fragments. Moderate joint  effusion.  DG Femur IMPRESSION:  The patient has sustained an acute comminuted slightly angulated  displaced fracture of the distal left femoral metadiaphysis.   CXR IMPRESSION:  The findings suggest low-grade compensated CHF. There is no  significant interstitial or alveolar edema.   Laura Dodrill, MD 10/04/2013, 10:08 AM PGY-1, [facility] FPTS Intern pager: [phone], text pages welcome

## 2013-10-04 NOTE — Progress Notes (Signed)
Attending Addendum  I examined the patient and discussed the assessment and plan with Dr. Pollie Meyer. I have reviewed the note and agree.  Patient medically stable.  Replacing potassium.  After a long discussion patient is amenable to SNF placement. SW consult already placed. Anticipate disposition in 1-2 days.     Dessa Phi, MD FAMILY MEDICINE TEACHING SERVICE

## 2013-10-05 LAB — BASIC METABOLIC PANEL
CO2: 35 mEq/L — ABNORMAL HIGH (ref 19–32)
Calcium: 8.4 mg/dL (ref 8.4–10.5)
Creatinine, Ser: 1.2 mg/dL — ABNORMAL HIGH (ref 0.50–1.10)
GFR calc Af Amer: 49 mL/min — ABNORMAL LOW (ref >90)
Sodium: 139 mEq/L (ref 135–145)

## 2013-10-05 LAB — CBC
HCT: 32.9 % — ABNORMAL LOW (ref 36.0–46.0)
MCHC: 30.1 g/dL (ref 30.0–36.0)
MCV: 88 fL (ref 78.0–100.0)
Platelets: 148 10*3/uL — ABNORMAL LOW (ref 150–400)
RDW: 19.5 % — ABNORMAL HIGH (ref 11.5–15.5)
WBC: 7.8 10*3/uL (ref 4.0–10.5)

## 2013-10-05 LAB — PROTIME-INR
INR: 1.25 (ref 0.00–1.49)
Prothrombin Time: 15.4 seconds — ABNORMAL HIGH (ref 11.6–15.2)

## 2013-10-05 LAB — VITAMIN D 25 HYDROXY (VIT D DEFICIENCY, FRACTURES): Vit D, 25-Hydroxy: 42 ng/mL (ref 30–89)

## 2013-10-05 MED ORDER — ACETAMINOPHEN 500 MG PO TABS: 500.0000 mg | ORAL_TABLET | Freq: Four times a day (QID) | ORAL | Status: AC | PRN

## 2013-10-05 MED ORDER — WARFARIN SODIUM 3 MG PO TABS
3.0000 mg | ORAL_TABLET | Freq: Once | ORAL | Status: AC
  Administered 2013-10-05: 3 mg via ORAL
  Filled 2013-10-05: qty 1

## 2013-10-05 MED ORDER — ATORVASTATIN CALCIUM 40 MG PO TABS: 40.0000 mg | ORAL_TABLET | Freq: Every day | ORAL | Status: AC

## 2013-10-05 MED ORDER — ENSURE COMPLETE PO LIQD: 237.0000 mL | Freq: Two times a day (BID) | ORAL | Status: DC | PRN

## 2013-10-05 MED ORDER — HYDROCODONE-ACETAMINOPHEN 5-300 MG PO TABS: 1.0000 | ORAL_TABLET | Freq: Four times a day (QID) | ORAL | Status: DC | PRN

## 2013-10-05 MED ORDER — FUROSEMIDE 80 MG PO TABS
80.0000 mg | ORAL_TABLET | Freq: Two times a day (BID) | ORAL | Status: DC
  Administered 2013-10-05: 18:00:00 80 mg via ORAL
  Filled 2013-10-05 (×4): qty 1

## 2013-10-05 NOTE — Progress Notes (Signed)
INITIAL NUTRITION ASSESSMENT  DOCUMENTATION CODES Per approved criteria  -Morbid Obesity   INTERVENTION: Add Ensure Complete po BID prn, each supplement provides 350 kcal and 13 grams of protein. Recommend more aggressive bowel regimen, per chart, pt has not had a BM x 10 days. RD to continue to follow nutrition care plan.  NUTRITION DIAGNOSIS: Inadequate oral intake related to variable appetite as evidenced by variable meal completion.   Goal: Intake to meet >90% of estimated nutrition needs.  Monitor:  weight trends, lab trends, I/O's, PO intake, supplement tolerance  Reason for Assessment: Low Braden  79 y.o. female  Admitting Dx: Femur fracture, left  ASSESSMENT: PMHx significant for pulmonary HTN, HTN, CHF, afib. Admitted s/p femur fx. Underwent IM nailing on 11/11.  Pt with low BP's and wheezing. Required IV lasix for diuresis. Evaluated by CIR, but they are recommend SNF at this time. Family and pt have chosen to d/c to [facility].  Per chart, pt is refusing meals today, but ate about 50% of her meals yesterday. She isn't able to tell me why she isn't eating well. Seems down. Asking me when she is going to get to go home.  Per chart, pt has not had a BM x 10 days.  Height: Ht Readings from Last 1 Encounters:  10/03/13 5\' 3"  (1.6 m)    Weight: Wt Readings from Last 1 Encounters:  10/05/13 242 lb 4.6 oz (109.9 kg)    Ideal Body Weight: 115 lb  % Ideal Body Weight: 210%  Wt Readings from Last 10 Encounters:  10/05/13 242 lb 4.6 oz (109.9 kg)  10/05/13 242 lb 4.6 oz (109.9 kg)  09/02/13 245 lb 1.6 oz (111.177 kg)  02/17/13 238 lb (107.956 kg)  02/17/13 236 lb (107.049 kg)  01/05/13 236 lb (107.049 kg)  09/22/12 235 lb (106.595 kg)  05/12/12 230 lb (104.327 kg)  02/14/12 235 lb (106.595 kg)  08/01/11 232 lb (105.235 kg)    Usual Body Weight: 230 - 235 lb  % Usual Body Weight: 104%  BMI:  Body mass index is 42.93 kg/(m^2). Obese Class  III  Estimated Nutritional Needs: Kcal: 1600 - 1750 Protein: 70 - 80 g Fluid: 1.5 - 1.8 liters  Skin: L leg incision  Diet Order: Cardiac  EDUCATION NEEDS: -No education needs identified at this time   Intake/Output Summary (Last 24 hours) at 10/05/13 1346 Last data filed at 10/05/13 1256  Gross per 24 hour  Intake    600 ml  Output   2050 ml  Net  -1450 ml    Last BM: 11/9  Labs:   Recent Labs Lab 10/03/13 0523 10/04/13 0430 10/04/13 1400 10/05/13 0608  NA 141 143 141 139  K 3.1* 2.9* 3.5 3.8  CL 100 97 94* 92*  CO2 31 38* 39* 35*  BUN 30* 26* 25* 26*  CREATININE 1.28* 1.15* 1.16* 1.20*  CALCIUM 8.4 8.5 8.6 8.4  PHOS  --  1.1*  --   --   GLUCOSE 113* 107* 112* 111*    CBG (last 3)  No results found for this basename: GLUCAP,  in the last 72 hours  Scheduled Meds: . acetaminophen  650 mg Oral Q6H  . atorvastatin  20 mg Oral q1800  . furosemide  80 mg Oral BID  . metoprolol succinate  75 mg Oral Daily  . sertraline  100 mg Oral Daily  . warfarin  3 mg Oral ONCE-1800  . Warfarin - Pharmacist Dosing Inpatient   Does not  apply q1800    Continuous Infusions:   Past Medical History  Diagnosis Date  . Obesity   . Depression   . HTN (hypertension)   . Hyperlipemia   . CKD (chronic kidney disease), stage III     baseline 1.6-1.8 - Cr 0.7 (`00) 1.6  8/02  1.5 (2/03), Cr. 0.9  8/03 1.1 (11/05), Creatinine Clearance 8/02 about 50 ml/min MSE:  30/30 (09/2006)  . Aortic aneurysm     a. Followed by Dr. Dorris Fetch;  b. 01/2013 MR angio: 4.7 x 4.9 cm Asc Ao Aneurysm - stable.  . Atrial fibrillation     a. Noted initially at adenosine myoview 8/10 --> chronic atrial fibrillation.  . Femur fracture, left     a. 11/2-14 s/p IM nail  . DVT (deep venous thrombosis)     a. s/p IVC filter.  . Chronic diastolic CHF (congestive heart failure)     a. 09/2012 Echo: EF 50-55%, mild LVH, mild MR, mod dil LA, mild-mod TR, mildly increased PASP.  Marland Kitchen History of pulmonary  embolism     a. s/p IVC filter;  b. chronic coumadin  . Skin cancer of face   . H/O cardiovascular stress test     a. 12/2010, normal myoview.  . Osteoarthritis     Past Surgical History  Procedure Laterality Date  . Cataract extraction    . Cholecystectomy    . Hernia repair    . Hysterotomy    . Osteotomy    . Orif deltoid ligament Right 05/2003    [name] 7/8/20014 (09/28/2013)  . Orif tibia & fibula fractures Right 06/17/2007    [name] 06/19/2007 (09/28/2013)  . Tonsillectomy and adenoidectomy  1940's    [name] 06/17/2007 (09/28/2013)  . Total knee arthroplasty Right 2004    [name] 06/17/2007 (09/28/2013)  . Vena cava filter placement      /medical hx notes (09/28/2013)    Jarold Motto MS, RD, LDN Pager: [phone] After-hours pager: [phone]

## 2013-10-05 NOTE — Clinical Social Work Placement (Addendum)
     Clinical Social Work Department CLINICAL SOCIAL WORK PLACEMENT NOTE 10/06/2013  Patient:  [facility]  Account Number:  [id number] Admit date:  09/28/2013  Clinical Social Worker:  Leron Croak, CLINICAL SOCIAL WORKER  Date/time:  10/02/2013 08:58 AM  Clinical Social Work is seeking post-discharge placement for this patient at the following level of care:   SKILLED NURSING   (*CSW will update this form in Epic as items are completed)   09/30/2013  Patient/family provided with [facility] Department of Clinical Social Works list of facilities offering this level of care within the geographic area requested by the patient (or if unable, by the patients family).  09/30/2013  Patient/family informed of their freedom to choose among providers that offer the needed level of care, that participate in Medicare, Medicaid or managed care program needed by the patient, have an available bed and are willing to accept the patient.  09/30/2013  Patient/family informed of [facility] ownership interest in [facility], as well as of the fact that they are under no obligation to receive care at this facility.  PASARR submitted to EDS on 10/02/2013 PASARR number received from EDS on 10/02/2013  FL2 transmitted to all facilities in geographic area requested by pt/family on  09/29/2013 FL2 transmitted to all facilities within larger geographic area on 09/29/2013  Patient informed that his/her managed care company has contracts with or will negotiate with  certain facilities, including the following:   [employer] Auth requetsted 10/05/13     Patient/family informed of bed offers received:  10/05/2013 Patient chooses bed at [facility] Physician recommends and patient chooses bed at    Patient to be transferred to [facility] on  10/06/2013 Patient to be transferred to facility by Ambulance  Sharin Mons)  The following physician request  were entered in Epic:   Additional Comments: 10/06/13  Ok to dc today to SNF.  [employer] auth obtained per SNF. Ok per patient, daughter and Charity fundraiser. No further CSW needs identified. CSW signing off.  Lorri Frederick. Arion Shankles, LCSWA  [phone]

## 2013-10-05 NOTE — Progress Notes (Signed)
Rehab admissions - Evaluated for possible admission.  Please see rehab MD consult done today by Dr. Riley Kill recommending SNF.  Agree with pursuit of SNF level for therapies.  Call me for questions.  #[phone]

## 2013-10-05 NOTE — Progress Notes (Signed)
Physical Therapy Treatment Patient Details Name: Laura Griffith MRN: [id number] DOB: Jul 26, 1933 Today's Date: 10/05/2013 Time: 4098-1191 PT Time Calculation (min): 24 min  PT Assessment / Plan / Recommendation  History of Present Illness Pt admitted after fall and fx of distal femur s/p nailing with post op drop in BP.   PT Comments   Pt willing to attempt HEP and mobility but continues to demonstrate minimal effort with transfers and no AROM LLE other than ankle pumps, min activation noted. Pt encouraged to continue HEP and RN educated for transfers and encouraged 2hrs or less in chair.   Follow Up Recommendations  SNF     Does the patient have the potential to tolerate intense rehabilitation     Barriers to Discharge        Equipment Recommendations       Recommendations for Other Services    Frequency     Progress towards PT Goals Progress towards PT goals: Progressing toward goals (very slowly)  Plan Current plan remains appropriate    Precautions / Restrictions Precautions Precautions: Fall Required Braces or Orthoses: Knee Immobilizer - Left Restrictions LLE Weight Bearing: Non weight bearing   Pertinent Vitals/Pain No  Pain sats 94% on 2L down to 88% on RA    Mobility  Bed Mobility Bed Mobility: Rolling Left;Supine to Sit;Sitting - Scoot to Edge of Bed Rolling Left: 1: +2 Total assist Rolling Left: Patient Percentage: 10% Supine to Sit: 1: +2 Total assist;HOB elevated;With rails Supine to Sit: Patient Percentage: 10% Sitting - Scoot to Edge of Bed: 1: +2 Total assist Sitting - Scoot to Edge of Bed: Patient Percentage: 10% Details for Bed Mobility Assistance: cueing and assist for reaching for rail, able to initiate trunk movement to roll and use of pad to scoot to EOB and fully pivot trunk with total assist to elevate trunk from surface. Once EOB placed maximove pad under pt for transfer to chair Transfers Transfers: Lateral/Scoot Transfers Lateral/Scoot  Transfers: 1: +2 Total assist Lateral Transfers: Patient Percentage: 0% Transfer via Lift Equipment: Maximove Details for Transfer Assistance: did not attempt standing due to poor balance and inability to stand with prior visit.  Ambulation/Gait Ambulation/Gait Assistance: Not tested (comment)    Exercises General Exercises - Lower Extremity Ankle Circles/Pumps: AAROM;Both;20 reps;Seated Short Arc Quad: AROM;Right;10 reps;Seated Heel Slides: PROM;AAROM;Right;10 reps;Supine Hip ABduction/ADduction: AAROM;PROM;Right;Left;10 reps;Seated (AAROM RLE, PROM LLE) Straight Leg Raises: PROM;Seated;Left;10 reps   PT Diagnosis:    PT Problem List:   PT Treatment Interventions:     PT Goals (current goals can now be found in the care plan section)    Visit Information  Last PT Received On: 10/05/13 Assistance Needed: +2 History of Present Illness: Pt admitted after fall and fx of distal femur s/p nailing with post op drop in BP.    Subjective Data      Cognition  Cognition Arousal/Alertness: Awake/alert Behavior During Therapy: Flat affect Overall Cognitive Status: Impaired/Different from baseline Area of Impairment: Following commands;Memory Memory: Decreased short-term memory Following Commands: Follows one step commands with increased time;Follows one step commands inconsistently    Insurance risk surveyor Sitting - Balance Support: Feet supported;Bilateral upper extremity supported Static Sitting - Level of Assistance: 2: Max assist Static Sitting - Comment/# of Minutes: pt with posterior lean EOB with progression from max to mod assist EOB with bil feet blocked to prevent pt sliding EOB  End of Session PT - End of Session Activity Tolerance: Patient limited by fatigue Patient left:  in chair;with call bell/phone within reach Nurse Communication: Mobility status;Need for lift equipment;Precautions;Weight bearing status   GP     Laura Griffith [facility] 10/05/2013,  12:41 PM Laura Griffith, PT [phone]

## 2013-10-05 NOTE — Progress Notes (Signed)
Family Medicine Teaching Service Daily Progress Note Intern Pager: [phone]  Patient name: Laura Griffith Medical record number: [id number] Date of birth: 1933/01/23 Age: 79 y.o. Gender: female  Primary Care Provider: Tawni Carnes, MD Consultants: Ortho Code Status: presume full  Pt Overview and Major Events to Date:  11/10: Femur fx  11/11: operative repair of femur fx, post op hypotension requiring transfer to stepdown 11/16: transfer to floor: switch lasix to PO; SW for SNF placement  Assessment and Plan: Laura Griffith is a 79 y.o. female presenting with a femur fracture. PMH is significant for pulmonary hypertension, hypertension, dCHF and atrial fibrillation   #Femur fracture:   S/p operative repair on 11/11 Pain control: tylenol scheduled; Vicodin prn  PT/OT : Supervision/Assistance - 24 hour;SNF   Physical med and rehab recs: SNF  SW consulted to help with placement  # Combined Acute Blood Loss and Dilutional Anemia - Multiple fluid boluses postoperatively for hypotension  - Admission H&H 11.0/37.6  - Lowest Postop H&H 7.4/24.9 Hgb 9.9 11/17 & platelets stable Warfarin (for afib) restarted 11/14, INR remains subtherapeutic BP stable; Pt clinically improving  # Hypotension: possibly due to inaccurate cuff readings BP last 24:(82-140)/(57-67) 140/57 mmHg   Holding hctz and losartan  AM Cortisol: 14.5  Lactic acid: wnl  Trop neg  #Atrial fibrillation:  Currently in Afib w/ RVR;  Cardiology consulted   metoprolol XR 75mg  qd per Cardiololgy subtherapeutic INR in low 1's  Warfarin per pharm; holding ASA 325  # CKD stage 3  - Cr 1.34 on admit; Cr stable: 1.2 11/17 - creatinine tolerating diuresis  #SOB Wheezing: HxHF no known history of asthma or COPD. Does not use an albuterol inhaler at home.  levalbuterol nebulizer q8hours for wheezing  2L O2 Tachypnea improving  ECHO Normal LV size with mild global hypokinesis, EF 50%. There was septal-lateral  dyssynchrony suggestive of bundle branch block. The RV was moderately dilated with normal systolic function.  CXR 11/14: Improving mild pulmonary interstitial edema. Incentive spirometry Lasix 80mg  PO   OP 2L in last 24hrs (Net negative ~ 4L); Wt 242lb (247 admit), has foley for I&O charting Continue to monitor fluid status  # Delirium: waxes and wanes - A&O to person, but not place or time this morning - Continue to assess; treat pain and orient often  # UTI - POA on Keflex (started 11/7); CTX given in ED - UA: Leu (+); Nit (-) - Culture: > 100,000: Enterobacter sensitive to Levofloxacin - levaquin stopped   FEN/GI: Heart diet - KVO Prophylaxis: Warfarin per pharm   Disposition: transfer to floor; dispo pending PT IR recs  Subjective:  No complaints today. Denies pain in her chest or Rt leg or trouble breathing or UTI syptoms.   Objective: Temp:  [98.2 F (36.8 C)-100 F (37.8 C)] 99 F (37.2 C) (11/17 0648) Pulse Rate:  [97-119] 112 (11/17 0648) Resp:  [18-19] 19 (11/17 0528) BP: (82-140)/(57-67) 140/57 mmHg (11/17 0648) SpO2:  [90 %-94 %] 94 % (11/17 0648) Weight:  [242 lb 4.6 oz (109.9 kg)] 242 lb 4.6 oz (109.9 kg) (11/17 0528) Physical Exam: General: In bed in no acute distress  Cardiovascular: Afib w/o RVR, no murmurs, rubs or gallops, distant heart sounds due to body habitus  Respiratory: CTAB via anterior auscultation, NWOB Abdomen: soft, obese, non-tender  Extremities: R calf has SCD in place, nontender to palpation Skin: ecchymosis on left forearm from venipuncture  Neuro: Alert to person, but not to time or place, follows commands  Laboratory:  Recent Labs Lab 10/03/13 0523 10/04/13 0430 10/05/13 0608  WBC 5.2 5.8 7.8  HGB 8.2* 9.0* 9.9*  HCT 26.2* 30.4* 32.9*  PLT 112* 149* 148*    Recent Labs Lab 10/01/13 0246  10/04/13 0430 10/04/13 1400 10/05/13 0608  NA 138  < > 143 141 139  K 3.5  < > 2.9* 3.5 3.8  CL 101  < > 97 94* 92*  CO2 28  < >  38* 39* 35*  BUN 34*  < > 26* 25* 26*  CREATININE 1.29*  < > 1.15* 1.16* 1.20*  CALCIUM 8.3*  < > 8.5 8.6 8.4  PROT 5.6*  --   --   --   --   BILITOT 0.4  --   --   --   --   ALKPHOS 51  --   --   --   --   ALT <5  --   --   --   --   AST 20  --   --   --   --   GLUCOSE 108*  < > 107* 112* 111*  < > = values in this interval not displayed. Imaging/Diagnostic Tests: CT Knee  - There is an impacted displaced and comminuted fracture through the  distal left femur. Approximately 14 mm of overlap/impaction.  Approximately 10 mm posterior displacement. There is also mild  medial displacement of the distal fracture fragments. Moderate joint  effusion.  DG Femur IMPRESSION:  The patient has sustained an acute comminuted slightly angulated  displaced fracture of the distal left femoral metadiaphysis.   CXR IMPRESSION:  The findings suggest low-grade compensated CHF. There is no  significant interstitial or alveolar edema.   [name], MD 10/05/2013, 9:36 AM PGY-1, [facility] FPTS Intern pager: [phone], text pages welcome

## 2013-10-05 NOTE — Progress Notes (Signed)
Called and spoke with Dr. Gwenlyn Saran. Asking if we can remove pt's foley today.  Instructed to keep foley in as pt is be diuresed and will assess it tomorrow. Will continue to monitor.  Amanda Pea, RN

## 2013-10-05 NOTE — Consult Note (Signed)
Physical Medicine and Rehabilitation Consult Reason for Consult: Left distal femur fracture Referring Physician: Family medicine teaching service   HPI: Laura Griffith is a 79 y.o. right-handed female hypertension, chronic renal insufficiency with baseline creatinine 1.6-1.8, chronic diastolic congestive heart failure, atrial fibrillation with chronic Coumadin therapy. Admitted 09/28/2013 after a fall in the kitchen without loss of consciousness sustaining a left distal femur fracture. INR on admission of 1.99 received fresh frozen plasma to reverse Coumadin. Underwent intramedullary retrograded femoral nailing of femur fracture 09/29/2013 per Dr. Margarita Griffith. Patient is nonweightbearing left lower extremity. Chronic Coumadin resumed for atrial fibrillation. Hospital course postop hypotension and acute on chronic diastolic congestive heart failure. Cardiology services consulted patient placed on diuresis. Patient's hydrochlorothiazide and losartan were held. Echocardiogram with ejection fraction of 50% and no wall motion abnormalities. There was septal lateral dyssynchrony of bundle branch block. Placed on Levaquin for Enterobacter urinary tract infection. Acute blood loss anemia hemoglobin 7.5-9.0 and monitored. Physical occupational therapy evaluations completed 10/01/2013 with slow progressive gains needing much cueing to maintain right precautions. M.D. as requested physical medicine rehabilitation consult to consider inpatient rehabilitation services. Review of Systems  Respiratory: Positive for shortness of breath.   Cardiovascular: Positive for leg swelling.  Musculoskeletal: Positive for myalgias.  Psychiatric/Behavioral: Positive for depression.  All other systems reviewed and are negative.   Past Medical History  Diagnosis Date  . Obesity   . Depression   . HTN (hypertension)   . Hyperlipemia   . CKD (chronic kidney disease), stage III     baseline 1.6-1.8 - Cr 0.7 (`00) 1.6  8/02   1.5 (2/03), Cr. 0.9  8/03 1.1 (11/05), Creatinine Clearance 8/02 about 50 ml/min MSE:  30/30 (09/2006)  . Aortic aneurysm     a. Followed by Dr. Dorris Griffith;  b. 01/2013 MR angio: 4.7 x 4.9 cm Asc Ao Aneurysm - stable.  . Atrial fibrillation     a. Noted initially at adenosine myoview 8/10 --> chronic atrial fibrillation.  . Femur fracture, left     a. 11/2-14 s/p IM nail  . DVT (deep venous thrombosis)     a. s/p IVC filter.  . Chronic diastolic CHF (congestive heart failure)     a. 09/2012 Echo: EF 50-55%, mild LVH, mild MR, mod dil LA, mild-mod TR, mildly increased PASP.  Marland Kitchen History of pulmonary embolism     a. s/p IVC filter;  b. chronic coumadin  . Skin cancer of face   . H/O cardiovascular stress test     a. 12/2010, normal myoview.  . Osteoarthritis    Past Surgical History  Procedure Laterality Date  . Cataract extraction    . Cholecystectomy    . Hernia repair    . Hysterotomy    . Osteotomy    . Orif deltoid ligament Right 05/2003    Laura Griffith 7/8/20014 (09/28/2013)  . Orif tibia & fibula fractures Right 06/17/2007    Laura Griffith 06/19/2007 (09/28/2013)  . Tonsillectomy and adenoidectomy  1940's    Laura Griffith 06/17/2007 (09/28/2013)  . Total knee arthroplasty Right 2004    Laura Griffith 06/17/2007 (09/28/2013)  . Vena cava filter placement      /medical hx notes (09/28/2013)   Family History  Problem Relation Age of Onset  . Breast cancer Mother   . Heart disease Sister   . Heart disease Brother    Social History:  reports that she has never smoked. She has never used smokeless tobacco. She reports that she does not drink alcohol or use  illicit drugs. Allergies:  Allergies  Allergen Reactions  . Sulfamethoxazole Hives   Medications Prior to Admission  Medication Sig Dispense Refill  . Calcium Carbonate-Vitamin D (CALCIUM-VITAMIN D) 500-200 MG-UNIT per tablet Take 1 tablet by mouth 2 (two) times daily with a meal.  60 tablet  6  . cephALEXin (KEFLEX) 500 MG capsule Take 1 capsule  (500 mg total) by mouth 3 (three) times daily.  30 capsule  0  . fish oil-omega-3 fatty acids 1000 MG capsule Take 2 capsules (2 g total) by mouth daily.  60 capsule  6  . hydrochlorothiazide (HYDRODIURIL) 25 MG tablet Take 25 mg by mouth daily.      Marland Kitchen losartan (COZAAR) 100 MG tablet Take 100 mg by mouth daily.      . metoprolol succinate (TOPROL-XL) 50 MG 24 hr tablet Take 75 mg by mouth daily. Take with or immediately following a meal.      . oxybutynin (DITROPAN-XL) 5 MG 24 hr tablet Take 5 mg by mouth daily.      . rosuvastatin (CRESTOR) 10 MG tablet Take 10 mg by mouth daily.      . sertraline (ZOLOFT) 100 MG tablet Take 100 mg by mouth daily.      . traMADol (ULTRAM) 50 MG tablet Take 100 mg by mouth every 12 (twelve) hours as needed for moderate pain or severe pain.      . vitamin B-12 (CYANOCOBALAMIN) 1000 MCG tablet Take 1 tablet (1,000 mcg total) by mouth daily.  90 tablet  0  . warfarin (COUMADIN) 1 MG tablet Take 1-2 mg by mouth daily at 6 PM. Pt takes 2 mg every day except 1 mg on wed, fri        Home: Home Living Family/patient expects to be discharged to:: Private residence Living Arrangements: Alone Available Help at Discharge: Family;Available PRN/intermittently Type of Home: Skilled Nursing Facility  Functional History:   Functional Status:  Mobility: Bed Mobility Bed Mobility: Supine to Sit;Sitting - Scoot to [place] of Bed;Sit to Supine Supine to Sit: 1: +2 Total assist;HOB elevated Supine to Sit: Patient Percentage: 10% Sitting - Scoot to Edge of Bed: 1: +2 Total assist Sitting - Scoot to Edge of Bed: Patient Percentage: 10% Sit to Supine: 1: +2 Total assist;HOB flat Sit to Supine: Patient Percentage: 10% Transfers Transfers: Sit to Stand;Stand to Sit Sit to Stand: 1: +2 Total assist;From bed Sit to Stand: Patient Percentage: 10% Stand to Sit: 1: +2 Total assist;To bed Stand to Sit: Patient Percentage: 10% Ambulation/Gait Ambulation/Gait Assistance: Not tested  (comment)    ADL: ADL Grooming: Performed;Wash/dry face;Set up Where Assessed - Grooming: Supine, head of bed up Lower Body Dressing: +1 Total assistance;Performed (donned socks) Where Assessed - Lower Body Dressing: Supine, head of bed up Transfers/Ambulation Related to ADLs: Pt too fatigued sitting EOB to attempt OOB transfer at this time.   ADL Comments: Total assist with BADLs at bed level.   Bil UEs very edematous and very effortful to move.  Cognition: Cognition Overall Cognitive Status: Within Functional Limits for tasks assessed Orientation Level: Oriented X4 Cognition Arousal/Alertness: Awake/alert Behavior During Therapy: WFL for tasks assessed/performed Overall Cognitive Status: Within Functional Limits for tasks assessed  Blood pressure 82/67, pulse 119, temperature 100 F (37.8 C), temperature source Oral, resp. rate 19, height 5\' 3"  (1.6 m), weight 109.9 kg (242 lb 4.6 oz), SpO2 93.00%. Physical Exam  Vitals reviewed. Constitutional:  79 year old obese female  HENT:  Head: Normocephalic.  Eyes: EOM are normal.  Neck: Normal range of motion. Neck supple. No thyromegaly present.  Cardiovascular:  Cardiac rate controlled  Respiratory: Effort normal and breath sounds normal.  GI: Soft. Bowel sounds are normal. She exhibits no distension. There is no tenderness.  Musculoskeletal:  Left leg tender.   Neurological: She is alert.  Patient was able to provide her name, age and date of birth. She needed some cues to name place. Noted decreased awareness of her deficits. Limited attention. Moves all 4's with limitations due to pain in the left LE.  Skin:  Hip incision clean and dry    Results for orders placed during the hospital encounter of 09/28/13 (from the past 24 hour(s))  BASIC METABOLIC PANEL     Status: Abnormal   Collection Time    10/04/13  2:00 PM      Result Value Range   Sodium 141  135 - 145 mEq/L   Potassium 3.5  3.5 - 5.1 mEq/L   Chloride 94 (*)  96 - 112 mEq/L   CO2 39 (*) 19 - 32 mEq/L   Glucose, Bld 112 (*) 70 - 99 mg/dL   BUN 25 (*) 6 - 23 mg/dL   Creatinine, Ser 0.27 (*) 0.50 - 1.10 mg/dL   Calcium 8.6  8.4 - 25.3 mg/dL   GFR calc non Af Amer 44 (*) >90 mL/min   GFR calc Af Amer 51 (*) >90 mL/min   No results found.  Assessment/Plan: Diagnosis: left distal femur fx 1. Does the need for close, 24 hr/day medical supervision in concert with the patient's rehab needs make it unreasonable for this patient to be served in a less intensive setting? No 2. Co-Morbidities requiring supervision/potential complications: afib, oa, chf 3. Due to bowel management, safety, medication administration and pain management, does the patient require 24 hr/day rehab nursing? No 4. Does the patient require coordinated care of a physician, rehab nurse, PT,OT to address physical and functional deficits in the context of the above medical diagnosis(es)? No Addressing deficits in the following areas: balance, endurance, locomotion, strength, transferring, bowel/bladder control, bathing and feeding 5. Can the patient actively participate in an intensive therapy program of at least 3 hrs of therapy per day at least 5 days per week? No 6. The potential for patient to make measurable gains while on inpatient rehab is poor 7. Anticipated functional outcomes upon discharge from inpatient rehab are n/a with PT, n/a with OT, n/a with SLP. 8. Estimated rehab length of stay to reach the above functional goals is: n/a 9. Does the patient have adequate social supports to accommodate these discharge functional goals? No 10. Anticipated D/C setting: other 11. Anticipated post D/C treatments: other 12. Overall Rehab/Functional Prognosis: fair  RECOMMENDATIONS: This patient's condition is appropriate for continued rehabilitative care in the following setting: SNF Patient has agreed to participate in recommended program. Potentially Note that insurance prior  authorization may be required for reimbursement for recommended care.  Comment:  Ranelle Oyster, MD, [facility] Physical Medicine & Rehabilitation     10/05/2013

## 2013-10-05 NOTE — Progress Notes (Signed)
Patient ID: Laura Griffith, female   DOB: August 15, 1933, 79 y.o.   MRN: [id number]    SUBJECTIVE: Reasonable diuresis yesterday.  Laura Griffith is somewhat confused this morning.    Marland Kitchen acetaminophen  650 mg Oral Q6H  . atorvastatin  20 mg Oral q1800  . furosemide  80 mg Oral BID  . metoprolol succinate  75 mg Oral Daily  . sertraline  100 mg Oral Daily  . warfarin  3 mg Oral ONCE-1800  . Warfarin - Pharmacist Dosing Inpatient   Does not apply q1800      Filed Vitals:   10/04/13 1403 10/04/13 2023 10/05/13 0528 10/05/13 0648  BP: 97/57 116/65 82/67 140/57  Pulse: 97 101 119 112  Temp: 98.3 F (36.8 C) 98.2 F (36.8 C) 100 F (37.8 C) 99 F (37.2 C)  TempSrc: Oral Oral Oral   Resp: 19 18 19    Height:      Weight:   242 lb 4.6 oz (109.9 kg)   SpO2: 93% 90% 93% 94%    Intake/Output Summary (Last 24 hours) at 10/05/13 0801 Last data filed at 10/05/13 0537  Gross per 24 hour  Intake    580 ml  Output   2050 ml  Net  -1470 ml    LABS: Basic Metabolic Panel:  Recent Labs  44/01/02 0523 10/04/13 0430 10/04/13 1400 10/05/13 0608  NA 141 143 141 139  K 3.1* 2.9* 3.5 3.8  CL 100 97 94* 92*  CO2 31 38* 39* 35*  GLUCOSE 113* 107* 112* 111*  BUN 30* 26* 25* 26*  CREATININE 1.28* 1.15* 1.16* 1.20*  CALCIUM 8.4 8.5 8.6 8.4  PHOS  --  1.1*  --   --    Liver Function Tests: No results found for this basename: AST, ALT, ALKPHOS, BILITOT, PROT, ALBUMIN,  in the last 72 hours No results found for this basename: LIPASE, AMYLASE,  in the last 72 hours CBC:  Recent Labs  10/04/13 0430 10/05/13 0608  WBC 5.8 7.8  HGB 9.0* 9.9*  HCT 30.4* 32.9*  MCV 88.9 88.0  PLT 149* 148*   Cardiac Enzymes: No results found for this basename: CKTOTAL, CKMB, CKMBINDEX, TROPONINI,  in the last 72 hours BNP: No components found with this basename: POCBNP,  D-Dimer: No results found for this basename: DDIMER,  in the last 72 hours Hemoglobin A1C: No results found for this basename: HGBA1C,  in  the last 72 hours Fasting Lipid Panel: No results found for this basename: CHOL, HDL, LDLCALC, TRIG, CHOLHDL, LDLDIRECT,  in the last 72 hours Thyroid Function Tests: No results found for this basename: TSH, T4TOTAL, FREET3, T3FREE, THYROIDAB,  in the last 72 hours Anemia Panel: No results found for this basename: VITAMINB12, FOLATE, FERRITIN, TIBC, IRON, RETICCTPCT,  in the last 72 hours  RADIOLOGY: Dg Chest 2 View  09/28/2013   CLINICAL DATA:  Cough status post fall.  EXAM: CHEST  2 VIEW  COMPARISON:  None.  FINDINGS: The cardiac silhouette is enlarged. The pulmonary vascularity is mildly prominent centrally but there is no definite cephalization of flow. There is tortuosity of the descending thoracic aorta. No pleural effusion is evident. There is no pneumothorax nor pneumomediastinum. There are degenerative changes of both shoulders.  IMPRESSION: The findings suggest low-grade compensated CHF. There is no significant interstitial or alveolar edema.   Electronically Signed   By: David  [place]   On: 09/28/2013 11:35   Dg Femur Left  09/30/2013   CLINICAL DATA:  Postop left  femur.  EXAM: LEFT FEMUR - 2 VIEW  COMPARISON:  09/29/2013.  FINDINGS: Left femoral intra medullary rod and distal left femoral surgical screws are present. Comminuted distal femoral fractures are present. These appears in near anatomic alignment on today's exam. Surgical hardware noted of the proximal tibia. Degenerative changes left hip. Surgical clips left upper thigh. Calcifications in the pelvis consistent with phleboliths.  IMPRESSION: Patient status post open reduction internal fixation left distal femoral fracture with good anatomic alignment.   Electronically Signed   By: Maisie Fus  Register   On: 09/30/2013 16:04   Dg Femur Left  09/29/2013   CLINICAL DATA:  Fracture fixation  EXAM: LEFT FEMUR - 2 VIEW; DG C-ARM 1-60 MIN  COMPARISON:  09/28/2013  FINDINGS: Comminuted fracture distal femur has been fixed with a locking  intra medullary nail. Mild displacement of the fracture. Hardware in satisfactory position.  There is a metal staple in the lateral tibial plateau  IMPRESSION: Comminuted fracture distal femur fixed with a locking intra medullary nail. Mild fracture displacement is noted.   Electronically Signed   By: Marlan Palau M.D.   On: 09/29/2013 17:22   Dg Femur Left  09/28/2013   CLINICAL DATA:  Pain status post fall.  EXAM: LEFT FEMUR - 2 VIEW  COMPARISON:  None.  FINDINGS: The patient has sustained an acute comminuted displaced fracture of the distal left femoral metadiaphysis. The femoral shaft is laterally positioned with respect to the distal femoral metaphysis. More proximally the femoral shaft and the visualized for portions of the femoral head and neck and intertrochanteric regions are grossly normal. There is high-grade narrowing of the medial and lateral joint compartments of the left knee. The patient has undergone previous fixation of a lateral tibial plateau fracture.  IMPRESSION: The patient has sustained an acute comminuted slightly angulated displaced fracture of the distal left femoral metadiaphysis.   Electronically Signed   By: David  [place]   On: 09/28/2013 11:41   Ct Knee Left Wo Contrast  09/28/2013   CLINICAL DATA:  Fall, leg pain.  EXAM: CT OF THE LEFT KNEE WITHOUT CONTRAST  TECHNIQUE: Multidetector CT imaging was performed according to the standard protocol. Multiplanar CT image reconstructions were also generated.  COMPARISON:  Plain films earlier today.  FINDINGS: There is an impacted displaced and comminuted fracture through the distal left femur. Approximately 14 mm of overlap/impaction. Approximately 10 mm posterior displacement. There is also mild medial displacement of the distal fracture fragments. Moderate joint effusion.  Advanced degenerative changes within the left knee with near complete joint space loss, exuberant osteophyte formation and subchondral sclerosis. Hardware rash  that remote hardware within the proximal tibia.  IMPRESSION: Impacted, comminuted and displaced distal femoral fracture as described above.  Advanced osteoarthritis of the left knee.   Electronically Signed   By: Charlett Nose M.D.   On: 09/28/2013 18:46   Dg Chest Portable 1 View  10/02/2013   CLINICAL DATA:  Shortness of breath  EXAM: PORTABLE CHEST - 1 VIEW  COMPARISON:  10/01/2013  FINDINGS: Cardiomegaly is again noted. The lungs are poorly aerated when compared with the prior exam. Some crowding of the vascular markings is seen. No focal confluent infiltrate is seen. The degree of vascular congestion and pulmonary edema has improved in the interval.  IMPRESSION: Poor inspiratory effort.  Improvement of vascular congestion when compare with the prior study.   Electronically Signed   By: Alcide Clever M.D.   On: 10/02/2013 07:39   Dg Chest  Port 1 View  10/01/2013   CLINICAL DATA:  Shortness of breath and wheezing.  EXAM: PORTABLE CHEST - 1 VIEW  COMPARISON:  09/28/2013.  FINDINGS: Persistent cardiomegaly is present. Mild pulmonary venous prominence is present. Progressive bilateral mild interstitial prominence present. These findings suggest progressive congestive heart failure with mild interstitial edema. No focal alveolar infiltrate. No pneumothorax. Degenerative changes both shoulders.  IMPRESSION: Progressive congestive heart failure with mild pulmonary interstitial edema.   Electronically Signed   By: Maisie Fus  Register   On: 10/01/2013 10:14   Dg C-arm 1-60 Min  09/29/2013   CLINICAL DATA:  Fracture fixation  EXAM: LEFT FEMUR - 2 VIEW; DG C-ARM 1-60 MIN  COMPARISON:  09/28/2013  FINDINGS: Comminuted fracture distal femur has been fixed with a locking intra medullary nail. Mild displacement of the fracture. Hardware in satisfactory position.  There is a metal staple in the lateral tibial plateau  IMPRESSION: Comminuted fracture distal femur fixed with a locking intra medullary nail. Mild fracture  displacement is noted.   Electronically Signed   By: Marlan Palau M.D.   On: 09/29/2013 17:22    PHYSICAL EXAM General: NAD Neck: JVP 8 cm, no thyromegaly or thyroid nodule.  Lungs: Dependent crackles CV: Nondisplaced PMI.  Heart irregular S1/S2, no S3/S4, no murmur.  Trace right ankle edema.  Abdomen: Soft, nontender, no hepatosplenomegaly, no distention.  Neurologic: Alert and oriented x 3.  Psych: Normal affect. Extremities: No clubbing or cyanosis.   TELEMETRY: Reviewed telemetry pt in atrial fibrillation, 90s  ASSESSMENT AND PLAN: 79 yo with chronic atrial fibrillation, chronic diastolic CHF, and ascending aortic aneurysm presented with femur fracture and developed post-op hypotension and acute/chronic diastolic CHF.  1. Atrial fibrillation: Laura Griffith has chronic atrial fibrillation. Continue Toprol XL and coumadin. 2. Acute/chronic diastolic CHF: Volume improved, reasonable diuresis yesterday.  Can decrease Lasix to bid.  3. Laura Griffith is unlikely to do well at home. At baseline, Laura Griffith is not very mobile. Would consider SNF but not sure Laura Griffith would agree.   [name] 10/05/2013 8:01 AM

## 2013-10-05 NOTE — Progress Notes (Signed)
ANTICOAGULATION CONSULT NOTE - follow up Pharmacy Consult for coumadin Indication: atrial fibrillation  Allergies  Allergen Reactions  . Sulfamethoxazole Hives    Patient Measurements: Height: 5\' 3"  (160 cm) (stated by patient ) Weight: 242 lb 4.6 oz (109.9 kg) IBW/kg (Calculated) : 52.4  Vital Signs: Temp: 99 F (37.2 C) (11/17 0648) Temp src: Oral (11/17 0528) BP: 140/57 mmHg (11/17 0648) Pulse Rate: 112 (11/17 0648)  Labs:  Recent Labs  10/03/13 0523 10/04/13 0430 10/04/13 1400 10/05/13 0608  HGB 8.2* 9.0*  --  9.9*  HCT 26.2* 30.4*  --  32.9*  PLT 112* 149*  --  148*  LABPROT 14.6 14.3  --  15.4*  INR 1.16 1.13  --  1.25  CREATININE 1.28* 1.15* 1.16* 1.20*    Estimated Creatinine Clearance: 46 ml/min (by C-G formula based on Cr of 1.2).   Assessment: 79 yo female on coumadin PTA for afib and it was held for concern of bleeding (Hg= 7.4 on 11/12; noted s/p 5mg  po vitamin K on 11/13).   Coumadin resumed 11/14.  INR= 1.25  Hg= 9.9 , plt= 148 home coumadin dose: 2mg  daily except 1mg  on W/F  Goal of Therapy:  INR 2-3 Plan:  -Coumadin 3 mg po today -Daily PT/INR  Talbert Cage, PharmD Clinical Pharmacist 10/05/2013 7:50 AM

## 2013-10-05 NOTE — Discharge Summary (Signed)
Family Medicine Teaching [facility] Discharge Summary  Patient name: Laura Griffith Medical record number: [id number] Date of birth: 01/18/1933 Age: 79 y.o. Gender: female Date of Admission: 09/28/2013  Date of Discharge: 10/06/13 Admitting Physician: Lora Paula, MD  Primary Care Provider: Tawni Carnes, MD Consultants: Cardiology; Orthopedics, Critical Care   Indication for Hospitalization: Femur Fracture  Discharge Diagnoses/Problem List:  Patient Active Problem List   Diagnosis Date Noted  . Acute-on-chronic respiratory failure 10/02/2013  . Femur fracture, left 10/01/2013  . Acute on chronic diastolic heart failure 10/01/2013  . Acute pulmonary edema 10/01/2013  . Hypotension 10/01/2013  . UTI (urinary tract infection) 09/02/2013  . Anemia 01/08/2013  . Atrial fibrillation 07/21/2009  . ANEURYSM, THORACIC AORTIC 08/12/2007  . PULMONARY HYPERTENSION 06/15/2007  . OBESITY, NOS 01/16/2007  . HYPERTENSION, BENIGN SYSTEMIC 01/16/2007  . CHF, EJECTION FRACTION > OR = 50% 01/16/2007  . DIVERTICULOSIS OF COLON 01/16/2007  . RENAL INSUFFICIENCY, CHRONIC 01/16/2007  . OSTEOARTHRITIS, MULTI SITES 01/16/2007   Disposition: d/c to [facility]  Discharge Condition: stable  Discharge Exam:  Temp: [99.3 F (37.4 C)-99.7 F (37.6 C)] 99.7 F (37.6 C) (11/18 0512)  Pulse Rate: [88-110] 90 (11/18 0903)  Resp: [18-20] 20 (11/18 0512)  BP: (95-119)/(50-60) 102/60 mmHg (11/18 0903)  SpO2: [93 %-97 %] 93 % (11/18 0512)  Weight: [240 lb 1.6 oz (108.909 kg)] 240 lb 1.6 oz (108.909 kg) (11/18 4098)  Physical Exam:  General: In bed in no acute distress  Cardiovascular: Afib w/o RVR, no murmurs, rubs or gallops, distant heart sounds due to body habitus  Respiratory: CTAB via anterior auscultation, NWOB  Abdomen: soft, obese, non-tender  Extremities: R calf has SCD in place, nontender to palpation, Left with brace and sleeve, palpable DPs  Skin: ecchymosis on left forearm from  venipuncture  Neuro: a&o X3 this am  Brief Hospital Course: Christina Gintz is a 79 y.o. female who presented with left femur fracture. PMH is significant for pulmonary hypertension, hypertension, dCHF, ascending aortic aneurysm and atrial fibrillation. She was taken to surgery, fracture repaired by nailing, and transferred to stepdown. After surgery she developed anemia (Hgb 7.4; 11.0 on admission) due to combined acute blood loss and dilutional effects and she was transfused 3 units of RBCs.  Critical Care was consulted due to her atrial fibulation and hypotension after transfusion and IV fluids. Labs reveal normal lactic acid and cortisol, and her low BPs were thought to be due to inaccurate reading due to body habitus, and her IV fluids stopped. She began to have increased wheezing and some hypoxia requiring 2L on [place] O2. Chest XR revealed. Pulmonary congestion and she was started on Lasix. Her BP and Hgb remained stable with diuresis and she was transferred out of stepdown. PT and OT were consulted and recommended SNF placement due to inability to bear weight and need for 24 hr supervision. Pt selected and placed at [facility].  Left Femur fracture: Result of fall at home while daughter helping transfer her to restroom. Received nailing by orthopedics on 11/11. She tolerated the procedure well. Her pain was initially controlled with morphine, but switched to Tylenol and Norco prn. PT and OT evaluated her and recommended SNF placement. Given rx for narcotic taper at home  Anemia: Mild anemia of 11 present on admission, which dropped to 7.4 post surgery. This was likely due to combined acute blood loss and dilutional with IV fluids required to maintain her BP.  Due to her Hx of HFpEF and oxygen requirement  she was transfused 3 units of RBCs. Her Hgb remain stable post transfusion and was 9.9 on 10/05/13. Will d/c on iron and bowel regimen. F/up at end of week in clinic for CBC, INR, BMET  Hypotension: Her  home BP medications were held post surgery due to hypotension. Critical Care and Cardiology, labs reveal normal lactic acid and cortisol indicating adequate perfusion. BP readings were thought to be inaccurate due to her body habitus and her Metoprolol was restarted to help control her Afib w/ RVR.  At discharge her BP were stable. Will f/up with cards  Afib: Present on admission. Metoprolol initially held due to hypotension. She remain in Afib throughout this hospitalization, but her RVR resolved with re-initiation of Metoprolol. See above  HFpEF: Present on admission with wheezing and intermittent home diuretic use. Wheezing and SOB increased with IV hydration for acute blood loss and hypotension. BNP on 11/12 was 6174. She was diuresesed with Lasixs IV, and switched to PO lasix at discharge. ECHO 10/02/13 revealed EF of 50%.   CKD stage 3: Her Cr 1.34 with GFR 43 on admission; baseline ~ 1.1 w/ GFR < 60%. Her Creatinine decreased with IVF and was back closer to baseline at discharge. D/c on lower dose of lasix 4omg po BID.   UTI: Present of admission: She was on Keflex at admission. UA revealed Enterobacter, and she was treated with Levofloxacin for 7 days (Ended 11/6). She remained afebrile and asymptomatic throughout this hospitalization.   Delirium: Oriented to person, but not time or place after surgery. Disorientation resolved post transfusion and with pain management.   Issues for Follow Up:  1. Check INR and adjust Warfarin- CBC, BMET, INR fri 2. PT or OT rehabilitation  3. Continue to assess need for diuretic and titration of BP medication 4. Follow up Hgb and Cr which were improving on discharge 5. Recommend MMSE for baseline cognitive function  Significant Procedures: INTRAMEDULLARY (IM) RETROGRADE FEMORAL NAILING (Left) as a surgical intervention   Significant Labs and Imaging:   Recent Labs Lab 10/03/13 0523 10/04/13 0430 10/05/13 0608  WBC 5.2 5.8 7.8  HGB 8.2* 9.0* 9.9*   HCT 26.2* 30.4* 32.9*  PLT 112* 149* 148*    Recent Labs Lab 10/01/13 0246 10/03/13 0523 10/04/13 0430 10/04/13 1400 10/05/13 0608 10/06/13 0510  NA 138 141 143 141 139 139  K 3.5 3.1* 2.9* 3.5 3.8 3.8  CL 101 100 97 94* 92* 93*  CO2 28 31 38* 39* 35* 35*  GLUCOSE 108* 113* 107* 112* 111* 111*  BUN 34* 30* 26* 25* 26* 33*  CREATININE 1.29* 1.28* 1.15* 1.16* 1.20* 1.45*  CALCIUM 8.3* 8.4 8.5 8.6 8.4 8.0*  PHOS  --   --  1.1*  --   --   --   ALKPHOS 51  --   --   --   --   --   AST 20  --   --   --   --   --   ALT <5  --   --   --   --   --   ALBUMIN 2.3*  --   --   --   --   --     Recent Labs Lab 10/02/13 0435 10/03/13 0523 10/04/13 0430 10/05/13 0608 10/06/13 0510  INR 1.53* 1.16 1.13 1.25 1.23   BNP (last 3 results)  Recent Labs  09/30/13 1530  PROBNP 6174.0*   Imaging/Diagnostic Tests:  CT Knee  - There is an impacted  displaced and comminuted fracture through the  distal left femur. Approximately 14 mm of overlap/impaction.  Approximately 10 mm posterior displacement. There is also mild  medial displacement of the distal fracture fragments. Moderate joint  effusion.   DG Femur IMPRESSION:  The patient has sustained an acute comminuted slightly angulated  displaced fracture of the distal left femoral metadiaphysis.   CXR 11/10: The findings suggest low-grade compensated CHF. There is no significant interstitial or alveolar edema. 11/13: Progressive congestive heart failure with mild pulmonary interstitial edema.  11/14: Improvement of vascular congestion when compare with the prior study.  Results/Tests Pending at Time of Discharge:  1. Vit D level  Discharge Medications:   Medication List    STOP taking these medications       cephALEXin 500 MG capsule  Commonly known as:  KEFLEX     hydrochlorothiazide 25 MG tablet  Commonly known as:  HYDRODIURIL     losartan 100 MG tablet  Commonly known as:  COZAAR     oxybutynin 5 MG 24 hr tablet   Commonly known as:  DITROPAN-XL     rosuvastatin 10 MG tablet  Commonly known as:  CRESTOR  Replaced by:  atorvastatin 40 MG tablet      TAKE these medications       acetaminophen 500 MG tablet  Commonly known as:  TYLENOL  Take 1 tablet (500 mg total) by mouth every 6 (six) hours as needed for mild pain, moderate pain or headache.     atorvastatin 40 MG tablet  Commonly known as:  LIPITOR  Take 1 tablet (40 mg total) by mouth daily at 6 PM.     calcium-vitamin D 500-200 MG-UNIT per tablet  Take 1 tablet by mouth 2 (two) times daily with a meal.     docusate sodium 100 MG capsule  Commonly known as:  COLACE  Take 1 capsule (100 mg total) by mouth 2 (two) times daily.     feeding supplement (ENSURE COMPLETE) Liqd  Take 237 mLs by mouth 2 (two) times daily as needed (please offer if meal intake is poor).     fish oil-omega-3 fatty acids 1000 MG capsule  Take 2 capsules (2 g total) by mouth daily.     furosemide 40 MG tablet  Commonly known as:  LASIX  Take 1 tablet (40 mg total) by mouth 2 (two) times daily.     Hydrocodone-Acetaminophen 2.5-325 MG Tabs  Take 1 tablet by mouth every 6 (six) hours as needed.     Iron 325 (65 FE) MG Tabs  Take 1 tablet by mouth 2 (two) times daily with a meal.     metoprolol succinate 25 MG 24 hr tablet  Commonly known as:  TOPROL-XL  Take 3 tablets (75 mg total) by mouth daily.     sertraline 100 MG tablet  Commonly known as:  ZOLOFT  Take 100 mg by mouth daily.     traMADol 50 MG tablet  Commonly known as:  ULTRAM  Take 100 mg by mouth every 12 (twelve) hours as needed for moderate pain or severe pain.     vitamin B-12 1000 MCG tablet  Commonly known as:  CYANOCOBALAMIN  Take 1 tablet (1,000 mcg total) by mouth daily.     warfarin 1 MG tablet  Commonly known as:  COUMADIN  Take 2 tablets (2 mg total) by mouth daily at 6 PM.       Discharge Instructions: Please refer to Patient Instructions section of  EMR for full details.   Patient was counseled important signs and symptoms that should prompt return to medical care, changes in medications, dietary instructions, activity restrictions, and follow up appointments.   Follow-Up Appointments: Follow-up Information   Follow up with MURPHY, TIMOTHY, D, MD. Schedule an appointment as soon as possible for a visit in 2 weeks.   Specialty:  Orthopedic Surgery   Contact information:   [address] [place] [address] [phone]       Follow up with Norma Fredrickson, NP. ([facility] - 10/13/13 at 10:30am)    Specialty:  Nurse Practitioner   Contact information:   [address] [place] [address] [phone]       Schedule an appointment as soon as possible for a visit with Tawni Carnes, MD. (for post-hospital care in the next week and BMET, CBC, INR)    Specialty:  Family Medicine   Contact information:   [address] [place] [address] [phone]      Anselm Lis, MD 10/06/2013, 10:58 AM PGY-1, [facility] Family [facility]

## 2013-10-05 NOTE — Progress Notes (Signed)
CSW met with patient and spoke to her daughter via telephone.  Bed offers given and they have chosen [facility]. CSW contacted Janie at [employer] and she will initiate [facility] authorization.  PT and OT evals sent to SNFs for review prior; sent same to [facility]- Nurse Liason for [facility] as well.  Will facilitate d/c when stable and authorization obtained.  Lorri Frederick. [place]  [phone]

## 2013-10-05 NOTE — Progress Notes (Signed)
Family Medicine Teaching Service Attending Note  I interviewed and examined patient Laura Griffith and reviewed their tests and x-rays.  I discussed with Dr. Gayla Doss and reviewed their note for today.  I agree with their assessment and plan.     Additionally  Feels ok Patient can clearly not care for herself at home Medically stable for SNF placement

## 2013-10-06 ENCOUNTER — Telehealth: Payer: Self-pay | Admitting: Nurse Practitioner

## 2013-10-06 ENCOUNTER — Telehealth: Payer: Self-pay | Admitting: Family Medicine

## 2013-10-06 LAB — BASIC METABOLIC PANEL
CO2: 35 mEq/L — ABNORMAL HIGH (ref 19–32)
Calcium: 8 mg/dL — ABNORMAL LOW (ref 8.4–10.5)
Chloride: 93 mEq/L — ABNORMAL LOW (ref 96–112)
Creatinine, Ser: 1.45 mg/dL — ABNORMAL HIGH (ref 0.50–1.10)
GFR calc non Af Amer: 34 mL/min — ABNORMAL LOW (ref >90)
Glucose, Bld: 111 mg/dL — ABNORMAL HIGH (ref 70–99)
Sodium: 139 mEq/L (ref 135–145)

## 2013-10-06 MED ORDER — WARFARIN SODIUM 1 MG PO TABS: 2.0000 mg | ORAL_TABLET | Freq: Every day | ORAL | Status: DC

## 2013-10-06 MED ORDER — METOPROLOL SUCCINATE ER 25 MG PO TB24: 75.0000 mg | ORAL_TABLET | Freq: Every day | ORAL | Status: AC

## 2013-10-06 MED ORDER — FUROSEMIDE 40 MG PO TABS: 40.0000 mg | ORAL_TABLET | Freq: Two times a day (BID) | ORAL | Status: DC

## 2013-10-06 MED ORDER — HYDROCODONE-ACETAMINOPHEN 2.5-325 MG PO TABS: 1.0000 | ORAL_TABLET | Freq: Four times a day (QID) | ORAL | Status: AC | PRN

## 2013-10-06 MED ORDER — ENSURE COMPLETE PO LIQD: 237.0000 mL | Freq: Two times a day (BID) | ORAL | Status: AC | PRN

## 2013-10-06 MED ORDER — FUROSEMIDE 40 MG PO TABS
40.0000 mg | ORAL_TABLET | Freq: Two times a day (BID) | ORAL | Status: DC
  Administered 2013-10-06: 09:00:00 40 mg via ORAL
  Filled 2013-10-06 (×3): qty 1

## 2013-10-06 MED ORDER — IRON 325 (65 FE) MG PO TABS: 1.0000 | ORAL_TABLET | Freq: Two times a day (BID) | ORAL | Status: AC

## 2013-10-06 MED ORDER — FUROSEMIDE 40 MG PO TABS: 40.0000 mg | ORAL_TABLET | Freq: Two times a day (BID) | ORAL | Status: AC

## 2013-10-06 MED ORDER — WARFARIN SODIUM 5 MG PO TABS
5.0000 mg | ORAL_TABLET | Freq: Once | ORAL | Status: DC
  Filled 2013-10-06: qty 1

## 2013-10-06 MED ORDER — DOCUSATE SODIUM 100 MG PO CAPS: 100.0000 mg | ORAL_CAPSULE | Freq: Two times a day (BID) | ORAL | Status: AC

## 2013-10-06 NOTE — Progress Notes (Signed)
Noted pt voided at this time large amount onto pads on the bed.  Post voided residual  89ml via bladder scan.  Tolerated well.  Dr. Michail Jewels made aware and instructed ok for pt to be d/c now.  Also informed her that pt's daughter Corrie Dandy called and wanted to know  if she has gotten her ABX.  Dr. Michail Jewels instructed that she has and she will call pt's daughter.  Amanda Pea, Charity fundraiser.

## 2013-10-06 NOTE — Telephone Encounter (Signed)
Spoke with daughter Hilda Lias regarding message below and she states that she was informed in hospital they would restart her 10 day dose of abx after surgery.  Pt was only given an abx shot as well as IV for the surgery.  Pt has not had anything since.  Would like to know why this was not restarted.  Also pt was cathed til yesterday so would have no signs or sx yet.  Please advise.  Pt is immobile and unable to come in for a follow up. Tahnee Cifuentes,CMA

## 2013-10-06 NOTE — Progress Notes (Signed)
ANTICOAGULATION CONSULT NOTE - follow up Pharmacy Consult for coumadin Indication: atrial fibrillation  Allergies  Allergen Reactions  . Sulfamethoxazole Hives    Patient Measurements: Height: 5\' 3"  (160 cm) (stated by patient ) Weight: 240 lb 1.6 oz (108.909 kg) (bed scale) IBW/kg (Calculated) : 52.4  Vital Signs: Temp: 99.7 F (37.6 C) (11/18 0512) Temp src: Oral (11/18 0512) BP: 95/50 mmHg (11/18 0512) Pulse Rate: 99 (11/18 0512)  Labs:  Recent Labs  10/04/13 0430 10/04/13 1400 10/05/13 0608 10/06/13 0510  HGB 9.0*  --  9.9*  --   HCT 30.4*  --  32.9*  --   PLT 149*  --  148*  --   LABPROT 14.3  --  15.4* 15.2  INR 1.13  --  1.25 1.23  CREATININE 1.15* 1.16* 1.20* 1.45*    Estimated Creatinine Clearance: 37.9 ml/min (by C-G formula based on Cr of 1.45).   Assessment: 79 yo female on coumadin PTA for afib and it was held for concern of bleeding (Hg= 7.4 on 11/12; noted s/p 5mg  po vitamin K on 11/13).   Coumadin resumed 11/14.  INR= 1.23  Hg= 9.9 , plt= 148 home coumadin dose: 2mg  daily except 1mg  on W/F  Goal of Therapy:  INR 2-3 Plan:  -Coumadin 5 mg po today -Daily PT/INR  Talbert Cage, PharmD Clinical Pharmacist 10/06/2013 7:52 AM

## 2013-10-06 NOTE — Discharge Summary (Signed)
Patient feels well with minimal pain.  Vs and labs are stable.  I have reviewed this discharge summary and agree.

## 2013-10-06 NOTE — Telephone Encounter (Signed)
Pt had UTI.  She has been in hospital w/ broken hip. She is being transported to [facility] for rehab. She has not been getting antibotic for UTI in the hospital. Laura Griffith says she needs orders from the dr to continue with antibotic Please advise

## 2013-10-06 NOTE — Progress Notes (Signed)
Put pt on bed pan for trial voiding after d/c'd foley cath.   Unable to void and informed Dr. Michail Jewels.   MD instructed that pt will  Not be able to be d/c'd until she voids.  Lupita Leash SW. Informed and notified transport to pick pt up later today.  Will continue to monitor.   Amanda Pea, Charity fundraiser.

## 2013-10-06 NOTE — Progress Notes (Signed)
Occupational Therapy Treatment Patient Details Name: Laura Griffith MRN: [id number] DOB: [birth year] Today's Date: 10/06/2013 Time: 7829-5621 OT Time Calculation (min): 17 min  OT Assessment / Plan / Recommendation  History of present illness Pt admitted after fall and fx of distal femur s/p nailing with post op drop in BP.   OT comments  Pt requires extensive assist for bed mobility and ADLs, pt was able to participate in grooming and UB ADLs at bed level with HOB elevated. Pt unable to complete full sit - sup due to fatigue  Follow Up Recommendations  Supervision/Assistance - 24 hour;SNF    Barriers to Discharge   none, plans to d/c to SNF for short term rehab    Equipment Recommendations  None recommended by OT;Other (comment) (TBD at SNF level of care)    Recommendations for Other Services    Frequency Min 2X/week   Progress towards OT Goals Progress towards OT goals: Not progressing toward goals - comment (pt does not seem very motivated)  Plan Discharge plan remains appropriate    Precautions / Restrictions Precautions Precautions: Fall Required Braces or Orthoses: Knee Immobilizer - Left Restrictions Weight Bearing Restrictions: Yes LLE Weight Bearing: Non weight bearing   Pertinent Vitals/Pain 4/10 L LE    ADL  Grooming: Performed;Wash/dry face;Set up;Wash/dry hands Where Assessed - Grooming: Supine, head of bed up Upper Body Bathing: Simulated;Moderate assistance;Minimal assistance Where Assessed - Upper Body Bathing: Supine, head of bed up Transfers/Ambulation Related to ADLs: pt unable to complete full sup - sit due to fatigue. Pt requires mechanical lift for transfers ADL Comments: UB ADLs bed level with HOB elevated    OT Diagnosis:    OT Problem List:   OT Treatment Interventions:     OT Goals(current goals can now be found in the care plan section)    Visit Information  Last OT Received On: 10/06/13 History of Present Illness: Pt admitted after fall  and fx of distal femur s/p nailing with post op drop in BP.    Subjective Data      Prior Functioning       Cognition  Cognition Arousal/Alertness: Awake/alert Behavior During Therapy: Flat affect Overall Cognitive Status: No family/caregiver present to determine baseline cognitive functioning Area of Impairment: Following commands;Memory Memory: Decreased short-term memory Following Commands: Follows one step commands with increased time;Follows one step commands inconsistently    Mobility  Bed Mobility Bed Mobility: Rolling Left;Rolling Right;Supine to Sit Rolling Left: 1: +2 Total assist Supine to Sit: 1: +2 Total assist;HOB elevated;With rails Sitting - Scoot to Edge of Bed: 1: +2 Total assist Sit to Supine: 1: +2 Total assist;HOB flat Details for Bed Mobility Assistance: cues to use rails, trunk movement and initiation. Pt unable to complete full sit - supine Transfers Transfers: Not assessed    Exercises      Balance Balance Balance Assessed: Yes   End of Session OT - End of Session Equipment Utilized During Treatment: Left knee immobilizer Activity Tolerance: Patient limited by fatigue Patient left: in bed;with call bell/phone within reach  GO     Galen Manila 10/06/2013, 2:36 PM

## 2013-10-06 NOTE — Progress Notes (Signed)
F/u appt placed in DC F/u section. Arabell Neria PA-C

## 2013-10-06 NOTE — Telephone Encounter (Signed)
According to the inpatient notes she has already completed a 7 day course of antibiotics for this UTI and she does not need further treatment. If she is having symptoms again then she should be re-evaluated by a physician.

## 2013-10-06 NOTE — Progress Notes (Signed)
Patient ID: Laura Griffith, female   DOB: 1933-03-05, 79 y.o.   MRN: [id number]   SUBJECTIVE: HR stable, creatinine rising.     Marland Kitchen acetaminophen  650 mg Oral Q6H  . atorvastatin  20 mg Oral q1800  . furosemide  40 mg Oral BID  . metoprolol succinate  75 mg Oral Daily  . sertraline  100 mg Oral Daily  . warfarin  5 mg Oral ONCE-1800  . Warfarin - Pharmacist Dosing Inpatient   Does not apply q1800      Filed Vitals:   10/05/13 1018 10/05/13 1500 10/05/13 2026 10/06/13 0512  BP: 114/50 119/54 112/51 95/50  Pulse: 110 103 88 99  Temp:  99.7 F (37.6 C) 99.3 F (37.4 C) 99.7 F (37.6 C)  TempSrc:  Oral Oral Oral  Resp:  18 20 20   Height:      Weight:    240 lb 1.6 oz (108.909 kg)  SpO2:  97% 93% 93%    Intake/Output Summary (Last 24 hours) at 10/06/13 0816 Last data filed at 10/06/13 0801  Gross per 24 hour  Intake    600 ml  Output   1475 ml  Net   -875 ml    LABS: Basic Metabolic Panel:  Recent Labs  78/29/56 0430  10/05/13 0608 10/06/13 0510  NA 143  < > 139 139  K 2.9*  < > 3.8 3.8  CL 97  < > 92* 93*  CO2 38*  < > 35* 35*  GLUCOSE 107*  < > 111* 111*  BUN 26*  < > 26* 33*  CREATININE 1.15*  < > 1.20* 1.45*  CALCIUM 8.5  < > 8.4 8.0*  PHOS 1.1*  --   --   --   < > = values in this interval not displayed. Liver Function Tests: No results found for this basename: AST, ALT, ALKPHOS, BILITOT, PROT, ALBUMIN,  in the last 72 hours No results found for this basename: LIPASE, AMYLASE,  in the last 72 hours CBC:  Recent Labs  10/04/13 0430 10/05/13 0608  WBC 5.8 7.8  HGB 9.0* 9.9*  HCT 30.4* 32.9*  MCV 88.9 88.0  PLT 149* 148*   Cardiac Enzymes: No results found for this basename: CKTOTAL, CKMB, CKMBINDEX, TROPONINI,  in the last 72 hours BNP: No components found with this basename: POCBNP,  D-Dimer: No results found for this basename: DDIMER,  in the last 72 hours Hemoglobin A1C: No results found for this basename: HGBA1C,  in the last 72  hours Fasting Lipid Panel: No results found for this basename: CHOL, HDL, LDLCALC, TRIG, CHOLHDL, LDLDIRECT,  in the last 72 hours Thyroid Function Tests: No results found for this basename: TSH, T4TOTAL, FREET3, T3FREE, THYROIDAB,  in the last 72 hours Anemia Panel: No results found for this basename: VITAMINB12, FOLATE, FERRITIN, TIBC, IRON, RETICCTPCT,  in the last 72 hours  RADIOLOGY: Dg Chest 2 View  09/28/2013   CLINICAL DATA:  Cough status post fall.  EXAM: CHEST  2 VIEW  COMPARISON:  None.  FINDINGS: The cardiac silhouette is enlarged. The pulmonary vascularity is mildly prominent centrally but there is no definite cephalization of flow. There is tortuosity of the descending thoracic aorta. No pleural effusion is evident. There is no pneumothorax nor pneumomediastinum. There are degenerative changes of both shoulders.  IMPRESSION: The findings suggest low-grade compensated CHF. There is no significant interstitial or alveolar edema.   Electronically Signed   By: David  [place]   On: 09/28/2013  11:35   Dg Femur Left  09/30/2013   CLINICAL DATA:  Postop left femur.  EXAM: LEFT FEMUR - 2 VIEW  COMPARISON:  09/29/2013.  FINDINGS: Left femoral intra medullary rod and distal left femoral surgical screws are present. Comminuted distal femoral fractures are present. These appears in near anatomic alignment on today's exam. Surgical hardware noted of the proximal tibia. Degenerative changes left hip. Surgical clips left upper thigh. Calcifications in the pelvis consistent with phleboliths.  IMPRESSION: Patient status post open reduction internal fixation left distal femoral fracture with good anatomic alignment.   Electronically Signed   By: Maisie Fus  Register   On: 09/30/2013 16:04   Dg Femur Left  09/29/2013   CLINICAL DATA:  Fracture fixation  EXAM: LEFT FEMUR - 2 VIEW; DG C-ARM 1-60 MIN  COMPARISON:  09/28/2013  FINDINGS: Comminuted fracture distal femur has been fixed with a locking intra  medullary nail. Mild displacement of the fracture. Hardware in satisfactory position.  There is a metal staple in the lateral tibial plateau  IMPRESSION: Comminuted fracture distal femur fixed with a locking intra medullary nail. Mild fracture displacement is noted.   Electronically Signed   By: Marlan Palau M.D.   On: 09/29/2013 17:22   Dg Femur Left  09/28/2013   CLINICAL DATA:  Pain status post fall.  EXAM: LEFT FEMUR - 2 VIEW  COMPARISON:  None.  FINDINGS: The patient has sustained an acute comminuted displaced fracture of the distal left femoral metadiaphysis. The femoral shaft is laterally positioned with respect to the distal femoral metaphysis. More proximally the femoral shaft and the visualized for portions of the femoral head and neck and intertrochanteric regions are grossly normal. There is high-grade narrowing of the medial and lateral joint compartments of the left knee. The patient has undergone previous fixation of a lateral tibial plateau fracture.  IMPRESSION: The patient has sustained an acute comminuted slightly angulated displaced fracture of the distal left femoral metadiaphysis.   Electronically Signed   By: David  [place]   On: 09/28/2013 11:41   Ct Knee Left Wo Contrast  09/28/2013   CLINICAL DATA:  Fall, leg pain.  EXAM: CT OF THE LEFT KNEE WITHOUT CONTRAST  TECHNIQUE: Multidetector CT imaging was performed according to the standard protocol. Multiplanar CT image reconstructions were also generated.  COMPARISON:  Plain films earlier today.  FINDINGS: There is an impacted displaced and comminuted fracture through the distal left femur. Approximately 14 mm of overlap/impaction. Approximately 10 mm posterior displacement. There is also mild medial displacement of the distal fracture fragments. Moderate joint effusion.  Advanced degenerative changes within the left knee with near complete joint space loss, exuberant osteophyte formation and subchondral sclerosis. Hardware rash that  remote hardware within the proximal tibia.  IMPRESSION: Impacted, comminuted and displaced distal femoral fracture as described above.  Advanced osteoarthritis of the left knee.   Electronically Signed   By: Charlett Nose M.D.   On: 09/28/2013 18:46   Dg Chest Portable 1 View  10/02/2013   CLINICAL DATA:  Shortness of breath  EXAM: PORTABLE CHEST - 1 VIEW  COMPARISON:  10/01/2013  FINDINGS: Cardiomegaly is again noted. The lungs are poorly aerated when compared with the prior exam. Some crowding of the vascular markings is seen. No focal confluent infiltrate is seen. The degree of vascular congestion and pulmonary edema has improved in the interval.  IMPRESSION: Poor inspiratory effort.  Improvement of vascular congestion when compare with the prior study.   Electronically Signed  By: Alcide Clever M.D.   On: 10/02/2013 07:39   Dg Chest Port 1 View  10/01/2013   CLINICAL DATA:  Shortness of breath and wheezing.  EXAM: PORTABLE CHEST - 1 VIEW  COMPARISON:  09/28/2013.  FINDINGS: Persistent cardiomegaly is present. Mild pulmonary venous prominence is present. Progressive bilateral mild interstitial prominence present. These findings suggest progressive congestive heart failure with mild interstitial edema. No focal alveolar infiltrate. No pneumothorax. Degenerative changes both shoulders.  IMPRESSION: Progressive congestive heart failure with mild pulmonary interstitial edema.   Electronically Signed   By: Maisie Fus  Register   On: 10/01/2013 10:14   Dg C-arm 1-60 Min  09/29/2013   CLINICAL DATA:  Fracture fixation  EXAM: LEFT FEMUR - 2 VIEW; DG C-ARM 1-60 MIN  COMPARISON:  09/28/2013  FINDINGS: Comminuted fracture distal femur has been fixed with a locking intra medullary nail. Mild displacement of the fracture. Hardware in satisfactory position.  There is a metal staple in the lateral tibial plateau  IMPRESSION: Comminuted fracture distal femur fixed with a locking intra medullary nail. Mild fracture  displacement is noted.   Electronically Signed   By: Marlan Palau M.D.   On: 09/29/2013 17:22    PHYSICAL EXAM General: NAD Neck: JVP 7 cm, no thyromegaly or thyroid nodule.  Lungs: Dependent crackles CV: Nondisplaced PMI.  Heart irregular S1/S2, no S3/S4, no murmur.  Trace right ankle edema.  Abdomen: Soft, nontender, no hepatosplenomegaly, no distention.  Neurologic: Alert and oriented x 3.  Psych: Normal affect. Extremities: No clubbing or cyanosis.   ASSESSMENT AND PLAN: 79 yo with chronic atrial fibrillation, chronic diastolic CHF, and ascending aortic aneurysm presented with femur fracture and developed post-op hypotension and acute/chronic diastolic CHF.  1. Atrial fibrillation: She has chronic atrial fibrillation. Continue Toprol XL and coumadin. 2. Acute/chronic diastolic CHF: Volume improved, creatinine rising.  Decrease Lasix to 40 mg po bid.  She can likely be on this dose going home. Continue to follow renal fxn.  3. Needs SNF.  I think she is relatively stable now from a cardiology standpoint.  I can see her in the office a couple of weeks after discharge.  I will sign off, call with questions.   [name] 10/06/2013 8:16 AM

## 2013-10-06 NOTE — Progress Notes (Signed)
Report called to Shanda Bumps, nurse at [facility].  Verbalized understanding.  Amanda Pea, Charity fundraiser.

## 2013-10-06 NOTE — Telephone Encounter (Signed)
Spoke with daughter, mom only had the one day (monday) of the 2nd round of antibiotics because she fell and was sent to the hospital.  She says that they never gave her the antibiotics although she is on them (I cant see this as I do not have the hospital view to see what was given).  Per dgt her mom is complaining of abdominal pain "just like before" and the "hospital is not giving her anything or checking her urine".  Advised I would send a message back to MD. Milas Gain, Maryjo Rochester

## 2013-10-06 NOTE — Progress Notes (Signed)
Foley cath =14Fr. D/c'd as order and pt tolerated well.  Urine amber color noted in foley bag.  Will continue to monitor.  Amanda Pea, Charity fundraiser.

## 2013-10-06 NOTE — Telephone Encounter (Signed)
Pt still in hospital on 11/18.

## 2013-10-06 NOTE — Telephone Encounter (Signed)
Pt has rec to go to snf.

## 2013-10-06 NOTE — Telephone Encounter (Signed)
New problem   TCM 10/13/13 @ 10:30 per danya pa calling.

## 2013-10-06 NOTE — Progress Notes (Signed)
Chart review complete.  Patient is not eligible for [facility] Care Management services because his/her PCP is not a [facility] primary care provider or is not [facility] affiliated.  For any additional questions or new referrals please contact Tim Henderson BSN RN [facility] Liaison at [phone] °

## 2013-10-06 NOTE — Progress Notes (Signed)
Family Medicine Teaching Service Attending Note  I interviewed and examined patient Laura Griffith and reviewed their tests and x-rays.  I discussed with Dr. Michail Jewels and reviewed their note for today.  I agree with their assessment and plan.

## 2013-10-06 NOTE — Telephone Encounter (Signed)
Called Hilda Lias back regarding mothers UTI and adequate abx coverage. Apparently she was told by nursing here that mother had not received antibiotic treatment and was very concerned that she would develop another UTI and have a fall. I informed her that in fact patient did receive appropriate coverage with levaquin (which is even stronger than the keflex that her mother was prescribed). Daughter seemed amenable to plan for mother's f/up at clinic given that there were no indications to "recheck blood and urine" as she requested. [facility], MD

## 2013-10-06 NOTE — Progress Notes (Signed)
Family Medicine Teaching Service Daily Progress Note Intern Pager: [phone]  Patient name: Laura Griffith Medical record number: [id number] Date of birth: [birth year] Age: 79 y.o. Gender: female  Primary Care Provider: Tawni Carnes, MD Consultants: Ortho Code Status: Full  Pt Overview and Major Events to Date:  11/10: Femur fx  11/11: operative repair of femur fx, post op hypotension requiring transfer to stepdown 11/16: transfer to floor: switch lasix to PO; SW for SNF placement  Assessment and Plan: Laura Griffith is a 79 y.o. female presenting with a femur fracture. PMH is significant for pulmonary hypertension, hypertension, dCHF and atrial fibrillation   #Femur fracture: S/p operative repair on 11/11 -Pain control: tylenol scheduled; Vicodin prn -PT/OT : Supervision/Assistance - 24 hour;SNF  -Physical med and rehab recs: SNF -SW consulted to help with placement- chosen [facility], waiting for auth  # Combined Acute Blood Loss and Dilutional Anemia - Multiple fluid boluses postoperatively for hypotension  - Admission H&H 11.0/37.6  - Lowest Postop H&H 7.4/24.9 Hgb 9.9 11/17 & platelets stable Warfarin (for afib) restarted 11/14, INR remains subtherapeutic- 1.23; 5mg  PO today BP stable; Pt clinically improving  # Hypotension: possibly due to inaccurate cuff readings; AM Cortisol: 14.5, Lactic acid: wnl, trop neg, 24hr BP (95-119)/(50-60) 102/60 mmHg in last 24 hrs -Holding hctz and losartan, cont toprolol XR   #Atrial fibrillation: Currently in Afib w/ RVR;Cardiology consulted rec cont toprol xl and coumadin, decreasing lasix dosage and following renal fxn for rising INR - metoprolol XR 75mg  qd per Cardiololgy -subtherapeutic INR in low 1's -Warfarin per pharm; holding ASA 325  # CKD stage 3  - Cr 1.34 on admit; Cr stable: 1.45 11/17 - will modify diuresis per cards, lasix 40mg  po BID  #SOB Wheezing: HxHF no known history of asthma or COPD. Does not use an albuterol  inhaler at home. -levalbuterol nebulizer q8hours for wheezing -[place] 2L O2 -Tachypnea improving -ECHO Normal LV size with mild global hypokinesis, EF 50%. There was septal-lateral dyssynchrony suggestive of bundle branch block. The RV was moderately dilated with normal systolic function.  CXR 11/14: Improving mild pulmonary interstitial edema. Incentive spirometry Lasix 80mg  PO   OP 2L in last 24hrs (Net negative ~ 4L); Wt 242lb (247 admit), has foley for I&O charting Continue to monitor fluid status  # Delirium: waxes and wanes - A&O x3 this morning - Continue to assess; treat pain and orient often  # UTI - POA on Keflex (started 11/7); CTX given in ED - UA: Leu (+); Nit (-) - Culture: > 100,000: Enterobacter sensitive to Levofloxacin - levaquin stopped   FEN/GI: Heart diet - KVO Prophylaxis: Warfarin per pharm   Disposition: pending SNF placement, voiding trial, coumadin dosing  Subjective: Does not feel hungry, no acute complaints, otherwise doing well  Objective: Temp:  [99.3 F (37.4 C)-99.7 F (37.6 C)] 99.7 F (37.6 C) (11/18 0512) Pulse Rate:  [88-110] 90 (11/18 0903) Resp:  [18-20] 20 (11/18 0512) BP: (95-119)/(50-60) 102/60 mmHg (11/18 0903) SpO2:  [93 %-97 %] 93 % (11/18 0512) Weight:  [240 lb 1.6 oz (108.909 kg)] 240 lb 1.6 oz (108.909 kg) (11/18 6578) Physical Exam: General: In bed in no acute distress  Cardiovascular: Afib w/o RVR, no murmurs, rubs or gallops, distant heart sounds due to body habitus  Respiratory: CTAB via anterior auscultation, NWOB Abdomen: soft, obese, non-tender  Extremities: R calf has SCD in place, nontender to palpation, Left with brace and sleeve, palpable DPs Skin: ecchymosis on left forearm from venipuncture  Neuro:  a&o X3 this am  Laboratory:  Recent Labs Lab 10/03/13 0523 10/04/13 0430 10/05/13 0608  WBC 5.2 5.8 7.8  HGB 8.2* 9.0* 9.9*  HCT 26.2* 30.4* 32.9*  PLT 112* 149* 148*    Recent Labs Lab 10/01/13 0246   10/04/13 1400 10/05/13 0608 10/06/13 0510  NA 138  < > 141 139 139  K 3.5  < > 3.5 3.8 3.8  CL 101  < > 94* 92* 93*  CO2 28  < > 39* 35* 35*  BUN 34*  < > 25* 26* 33*  CREATININE 1.29*  < > 1.16* 1.20* 1.45*  CALCIUM 8.3*  < > 8.6 8.4 8.0*  PROT 5.6*  --   --   --   --   BILITOT 0.4  --   --   --   --   ALKPHOS 51  --   --   --   --   ALT <5  --   --   --   --   AST 20  --   --   --   --   GLUCOSE 108*  < > 112* 111* 111*  < > = values in this interval not displayed. Imaging/Diagnostic Tests: CT Knee  - There is an impacted displaced and comminuted fracture through the  distal left femur. Approximately 14 mm of overlap/impaction.  Approximately 10 mm posterior displacement. There is also mild  medial displacement of the distal fracture fragments. Moderate joint  effusion.  DG Femur IMPRESSION:  The patient has sustained an acute comminuted slightly angulated  displaced fracture of the distal left femoral metadiaphysis.   CXR IMPRESSION:  The findings suggest low-grade compensated CHF. There is no  significant interstitial or alveolar edema.   Anselm Lis, MD 10/06/2013, 9:10 AM PGY-1, [facility] Family Medicine FPTS Intern pager: [phone], text pages welcome

## 2013-10-06 NOTE — Progress Notes (Signed)
Pt d/c to [facility] via ambulance.  Amanda Pea, Charity fundraiser.

## 2013-10-07 ENCOUNTER — Encounter (HOSPITAL_COMMUNITY): Payer: Self-pay | Admitting: Orthopedic Surgery

## 2013-10-07 NOTE — Telephone Encounter (Signed)
Spoke with patient's daughter who states patient is at [facility] recovering from broken femur.  Daughter states patient cannot come to TCM appointment scheduled for 11/25 with Lawson Fiscal; states there are no new cardiac issues.  Daughter states cardiology consulted on patient during hospitalization but there were no changes to meds/tx that warrant a follow-up.  Daughter asked me to schedule patient with Dr. Shirlee Latch for the first of the year when patient has healed from femur fx.  I scheduled appointment for 1/12 @ 1000 and advised patient's daughter to call back if appointment needs to be rescheduled.  Daughter verbalized agreement and understanding.

## 2013-10-13 ENCOUNTER — Encounter: Payer: Medicare HMO | Admitting: Nurse Practitioner

## 2013-10-14 ENCOUNTER — Other Ambulatory Visit: Payer: Self-pay | Admitting: Cardiology

## 2013-11-14 ENCOUNTER — Other Ambulatory Visit: Payer: Self-pay | Admitting: Family Medicine

## 2013-11-14 ENCOUNTER — Other Ambulatory Visit: Payer: Self-pay | Admitting: Cardiology

## 2013-11-16 NOTE — Telephone Encounter (Signed)
Tried to call pt and let her know that this was taken care of but no way to leave message. Vale Peraza,CMA

## 2013-11-18 ENCOUNTER — Telehealth: Payer: Self-pay | Admitting: Family Medicine

## 2013-11-18 MED ORDER — TRAMADOL HCL 50 MG PO TABS: 100.0000 mg | ORAL_TABLET | Freq: Two times a day (BID) | ORAL | Status: DC | PRN

## 2013-11-18 NOTE — Telephone Encounter (Signed)
Attempted to call patient twice.  1st time phone was picked up and then hung up, 2nd time phone continuously rang. Laura Griffith, Laura Griffith

## 2013-11-30 ENCOUNTER — Ambulatory Visit: Payer: Medicare HMO | Admitting: Cardiology

## 2013-12-23 ENCOUNTER — Other Ambulatory Visit: Payer: Self-pay | Admitting: Family Medicine

## 2013-12-23 MED ORDER — TRAMADOL HCL 50 MG PO TABS: 100.0000 mg | ORAL_TABLET | Freq: Two times a day (BID) | ORAL | Status: AC | PRN

## 2014-01-11 ENCOUNTER — Other Ambulatory Visit: Payer: Self-pay | Admitting: Cardiology

## 2014-02-04 ENCOUNTER — Other Ambulatory Visit: Payer: Self-pay

## 2014-02-04 DIAGNOSIS — I712 Thoracic aortic aneurysm, without rupture, unspecified: Secondary | ICD-10-CM

## 2014-02-23 ENCOUNTER — Other Ambulatory Visit: Payer: Medicare HMO

## 2014-02-23 ENCOUNTER — Ambulatory Visit: Payer: Medicare Other | Admitting: Thoracic Surgery (Cardiothoracic Vascular Surgery)

## 2014-03-30 ENCOUNTER — Telehealth: Payer: Self-pay | Admitting: Family Medicine

## 2014-03-30 NOTE — Telephone Encounter (Signed)
I returned call and spoke with Ms. Enid Derry (patient's daughter). Patient/family are unhappy with care at nursing home and are attempting to bring her home, however she appears to be full assist and will need home equipment and home health services. I discussed with daughter that she needs to have the nursing home doctor and staff have this setup before bringing her home; she is in agreement and has been trying to get this setup already. Ms. Enid Derry will call office and schedule an appointment with the clinic. -Dr. Lamar Benes

## 2014-03-30 NOTE — Telephone Encounter (Signed)
Cell: [phone] Mother is being released by [facility]. Daughter knows a letter is needed from PCP so home care can be provided Please advise

## 2014-04-05 ENCOUNTER — Ambulatory Visit: Payer: Medicare HMO | Admitting: Family Medicine

## 2014-04-08 ENCOUNTER — Telehealth: Payer: Self-pay | Admitting: Family Medicine

## 2014-04-08 LAB — PROTIME-INR: INR: 2.7 — AB (ref <=1.1)

## 2014-04-08 NOTE — Telephone Encounter (Signed)
Daughter call back to also let us know that she needs a new order for a standard wheel chair for her mother. This needs to sent to [facility] fax [phone]. The prescription must say that her mother can not stand or walk and this is the reason for the wheel chair. If you have any questions please call [facility] at [phone] ext [phone]. jw

## 2014-04-08 NOTE — Telephone Encounter (Signed)
Daughter called and her mother has been released form [facility] and is back home. The need for the doctor to call in orders for her mother's Pads for the beds. They need washable one and also disposable ones. She also needs large diapers for her mother but not the pull up one since her mother can not stand. Please call this into [facility] at [place] ext [phone]. Blima Rich

## 2014-04-08 NOTE — Telephone Encounter (Signed)
Will forward to MD.  She will need this order signed by a pecos certified MD since she has [facility].  Cherith Tewell,CMA

## 2014-04-08 NOTE — Telephone Encounter (Signed)
Spoke with Blythe Stanford. She is having difficulties with SW from [facility] not filling out paperwork for needed supplies and services (specifically mentions form [id number] for [employer]). Her current difficulties are the wheelchair she has is too wide for doorways at the home, needs pull up diapers, and is not able to get her mom into the car to transport to the [facility] for a visit. I discussed that she needs to continue talking with [facility] to get the paperwork for this equipment started, as we will be unable to order this since we have not seen her in the clinic recently for these problems. I will talk to geri resident to see if they could possibly do a home visit to expedite this process/order equipment. -Dr. Lamar Benes

## 2014-04-09 ENCOUNTER — Ambulatory Visit (INDEPENDENT_AMBULATORY_CARE_PROVIDER_SITE_OTHER): Payer: Medicare Other | Admitting: Cardiology

## 2014-04-09 DIAGNOSIS — I4891 Unspecified atrial fibrillation: Secondary | ICD-10-CM

## 2014-04-14 ENCOUNTER — Ambulatory Visit (INDEPENDENT_AMBULATORY_CARE_PROVIDER_SITE_OTHER): Payer: Medicare Other | Admitting: Cardiovascular Disease

## 2014-04-14 DIAGNOSIS — I4891 Unspecified atrial fibrillation: Secondary | ICD-10-CM

## 2014-04-14 LAB — PROTIME-INR: INR: 1.6 — AB (ref 0.9–1.1)

## 2014-04-22 LAB — PROTIME-INR: INR: 2.2 — AB (ref <=1.1)

## 2014-04-23 ENCOUNTER — Telehealth: Payer: Self-pay | Admitting: Family Medicine

## 2014-04-23 ENCOUNTER — Ambulatory Visit (INDEPENDENT_AMBULATORY_CARE_PROVIDER_SITE_OTHER): Payer: Medicare Other | Admitting: Cardiovascular Disease

## 2014-04-23 DIAGNOSIS — I4891 Unspecified atrial fibrillation: Secondary | ICD-10-CM

## 2014-04-23 NOTE — Telephone Encounter (Signed)
Will forward to Md to see if he received results from his box. Laura Griffith,CMA

## 2014-04-23 NOTE — Telephone Encounter (Signed)
Nursing home called because they faxed lab results over for pt and they wanted to know when they could expect the antibiotics for the pt. Please send to [facility] at [facility]. jw

## 2014-04-26 MED ORDER — CEPHALEXIN 500 MG PO CAPS: 500.0000 mg | ORAL_CAPSULE | Freq: Three times a day (TID) | ORAL | Status: DC

## 2014-04-26 NOTE — Telephone Encounter (Signed)
Nursing home called and the lab results are E-coli and they are worried that she might end up in the hospital. They really need something called in. jw

## 2014-04-26 NOTE — Telephone Encounter (Signed)
Reviewed culture results, e.coli >100k colonies resistant to cipro/levofloxacin; sensitive to cephalosporins. Sent in prescription for keflex 500mg  TID for 7 days. Called Ms Laura Griffith and she said she will stop by to pick it up. -Dr. Lamar Benes

## 2014-04-29 ENCOUNTER — Telehealth: Payer: Self-pay | Admitting: *Deleted

## 2014-04-29 NOTE — Telephone Encounter (Signed)
Message left on our office MD line--need NPI #.  Returned call and left message to call our office back.  Burna Forts, BSN, RN-BC

## 2014-05-05 ENCOUNTER — Ambulatory Visit (INDEPENDENT_AMBULATORY_CARE_PROVIDER_SITE_OTHER): Payer: Medicare Other | Admitting: Pharmacist

## 2014-05-05 DIAGNOSIS — I4891 Unspecified atrial fibrillation: Secondary | ICD-10-CM

## 2014-05-05 LAB — POCT INR: INR: 2.9

## 2014-05-06 ENCOUNTER — Telehealth: Payer: Self-pay | Admitting: Pharmacist

## 2014-05-06 NOTE — Telephone Encounter (Signed)
Patient is home bound, and has been treated with warfarin for h/o AFib.  [facility] is about to discharge patient, and the patient and daughter wanted to know if it would be possible to change to Eliquis bid since she can no longer make it in to the office without an ambulance.  Based on her weight, age, and Scr, Eliquis 5 mg bid would be appropriate.  Daughter called their insurance company yesterday and tells me it is covered by her plan.  I have asked the nurse to check a BMET, CBC, and INR next week so we can know what dose to use if you feel the change to Eliquis is appropriate.    Dr. Aundra Dubin, any objection to changing patient to Eliquis due to transportation issues?

## 2014-05-06 NOTE — Telephone Encounter (Signed)
That would be fine 

## 2014-05-12 ENCOUNTER — Ambulatory Visit (INDEPENDENT_AMBULATORY_CARE_PROVIDER_SITE_OTHER): Payer: Medicare Other | Admitting: Pharmacist

## 2014-05-12 DIAGNOSIS — Z5181 Encounter for therapeutic drug level monitoring: Secondary | ICD-10-CM

## 2014-05-12 LAB — POCT INR: INR: 2.62

## 2014-05-12 MED ORDER — APIXABAN 5 MG PO TABS: 5.0000 mg | ORAL_TABLET | Freq: Two times a day (BID) | ORAL | Status: DC

## 2014-05-19 ENCOUNTER — Telehealth: Payer: Self-pay | Admitting: Family Medicine

## 2014-05-19 ENCOUNTER — Telehealth: Payer: Self-pay | Admitting: *Deleted

## 2014-05-19 DIAGNOSIS — N342 Other urethritis: Secondary | ICD-10-CM

## 2014-05-19 MED ORDER — SULFAMETHOXAZOLE-TMP DS 800-160 MG PO TABS: 1.0000 | ORAL_TABLET | Freq: Two times a day (BID) | ORAL | Status: AC

## 2014-05-19 NOTE — Telephone Encounter (Signed)
Called and discussed with daughter, Ms Enid Derry. Patient still having symptoms. Completed keflex course (took 7d, TID) but repeat urine culture still growing e. Coli with 85,000 colonies (resistant only to cipro). Ms Enid Derry says she has been having infections for a long time that were ignored by nursing home, only started treatment during last week before discharge. Agreeable to try bactrim DS x 10 days. -Dr. Lamar Benes

## 2014-05-19 NOTE — Telephone Encounter (Signed)
Laney Pastor, RN with [facility] called requesting to speak with pt's PCP.  Per Jenny Reichmann she faxed over a lab report for urine culture that showed E. Coli 85,000 and requesting treatment.  Jenny Reichmann stated that pt is still having symptoms.  Pt can not take Cipro or Levaquin because is resistant.  Please give her a call or pt's daughter a call.  Cindy's phone number [phone].

## 2014-11-19 ENCOUNTER — Other Ambulatory Visit: Payer: Self-pay | Admitting: Cardiology

## 2015-04-02 ENCOUNTER — Other Ambulatory Visit: Payer: Self-pay | Admitting: Cardiology

## 2016-06-06 ENCOUNTER — Encounter (HOSPITAL_COMMUNITY): Payer: Self-pay | Admitting: Nurse Practitioner

## 2016-06-06 ENCOUNTER — Emergency Department (HOSPITAL_COMMUNITY)
Admission: EM | Admit: 2016-06-06 | Discharge: 2016-06-07 | Disposition: A | Discharge status: Home / Self Care | Payer: Medicare Other | Attending: Emergency Medicine | Admitting: Emergency Medicine

## 2016-06-06 ENCOUNTER — Emergency Department (HOSPITAL_COMMUNITY): Payer: Medicare Other

## 2016-06-06 DIAGNOSIS — I130 Hypertensive heart and chronic kidney disease with heart failure and stage 1 through stage 4 chronic kidney disease, or unspecified chronic kidney disease: Secondary | ICD-10-CM | POA: Diagnosis not present

## 2016-06-06 DIAGNOSIS — W050XXA Fall from non-moving wheelchair, initial encounter: Secondary | ICD-10-CM | POA: Diagnosis not present

## 2016-06-06 DIAGNOSIS — S72401A Unspecified fracture of lower end of right femur, initial encounter for closed fracture: Secondary | ICD-10-CM

## 2016-06-06 DIAGNOSIS — N183 Chronic kidney disease, stage 3 (moderate): Secondary | ICD-10-CM | POA: Insufficient documentation

## 2016-06-06 DIAGNOSIS — I5032 Chronic diastolic (congestive) heart failure: Secondary | ICD-10-CM | POA: Diagnosis not present

## 2016-06-06 DIAGNOSIS — Y92009 Unspecified place in unspecified non-institutional (private) residence as the place of occurrence of the external cause: Secondary | ICD-10-CM | POA: Diagnosis not present

## 2016-06-06 DIAGNOSIS — Y939 Activity, unspecified: Secondary | ICD-10-CM | POA: Diagnosis not present

## 2016-06-06 DIAGNOSIS — Z7901 Long term (current) use of anticoagulants: Secondary | ICD-10-CM | POA: Diagnosis not present

## 2016-06-06 DIAGNOSIS — Y999 Unspecified external cause status: Secondary | ICD-10-CM | POA: Diagnosis not present

## 2016-06-06 DIAGNOSIS — S72411A Displaced unspecified condyle fracture of lower end of right femur, initial encounter for closed fracture: Secondary | ICD-10-CM | POA: Insufficient documentation

## 2016-06-06 DIAGNOSIS — Z85828 Personal history of other malignant neoplasm of skin: Secondary | ICD-10-CM | POA: Insufficient documentation

## 2016-06-06 DIAGNOSIS — S79921A Unspecified injury of right thigh, initial encounter: Secondary | ICD-10-CM | POA: Diagnosis present

## 2016-06-06 DIAGNOSIS — Z96651 Presence of right artificial knee joint: Secondary | ICD-10-CM | POA: Insufficient documentation

## 2016-06-06 DIAGNOSIS — T148XXA Other injury of unspecified body region, initial encounter: Secondary | ICD-10-CM

## 2016-06-06 MED ORDER — FENTANYL CITRATE (PF) 100 MCG/2ML IJ SOLN
100.0000 ug | Freq: Once | INTRAMUSCULAR | Status: DC
Start: 2016-06-06 — End: 2016-06-06
  Filled 2016-06-06: qty 2

## 2016-06-06 MED ORDER — OXYCODONE-ACETAMINOPHEN 5-325 MG PO TABS
2.0000 | ORAL_TABLET | Freq: Once | ORAL | Status: AC
  Administered 2016-06-06: 2 via ORAL
  Filled 2016-06-06: qty 2

## 2016-06-06 MED ORDER — FENTANYL CITRATE (PF) 100 MCG/2ML IJ SOLN
50.0000 ug | Freq: Once | INTRAMUSCULAR | Status: AC
  Administered 2016-06-06: 50 ug via INTRAMUSCULAR

## 2016-06-06 MED ORDER — OXYCODONE-ACETAMINOPHEN 5-325 MG PO TABS: 1.0000 | ORAL_TABLET | ORAL | Status: AC | PRN

## 2016-06-06 NOTE — Progress Notes (Signed)
Orthopedic Tech Progress Note Patient Details:  Laura Griffith 1933/02/15 IT:[id number]  Ortho Devices Type of Ortho Device: Knee Immobilizer Ortho Device/Splint Location: rle Ortho Device/Splint Interventions: Ordered, Application   Karolee Stamps 06/06/2016, 8:52 PM

## 2016-06-06 NOTE — Discharge Instructions (Signed)
You were seen in the ED today for your leg pain. You were diagnosed with a femur fracture which is a break at the end of your thigh bone. You have elected not to have surgery for this. Please take your pain medicine as prescribed and as we discussed. Follow up with your doctor in the next 2-3 days for reevaluation. You may also follow-up with orthopedics, Dr. Rush Farmer for reevaluation. Return to ED for new or worsening symptoms as we discussed.

## 2016-06-06 NOTE — ED Notes (Signed)
Per EMS pt from home, bed bound. PCP ordered home xray for leg pain and stated to patient she had a right leg femur fracture. Pt obese- no obvious deformity or bruising to right leg. When patient sat up EMS noted rotation to right leg- family sts is baseline for patient. EMS noted bruising on the left leg from knee down- family states femur fracture is right leg. No paperwork found to confirm. Bilateral pedal pulses 2+. Patient declined EMS transport but family requested transport but sts patient does not want surgery.

## 2016-06-06 NOTE — ED Notes (Signed)
Contacted [facility] for tx home 

## 2016-06-06 NOTE — ED Notes (Signed)
[facility] called to transport pt home 

## 2016-06-06 NOTE — ED Notes (Signed)
Ortho tech called for knee immobolizer

## 2016-06-06 NOTE — ED Notes (Signed)
Doctors making house calls [phone]

## 2016-06-06 NOTE — ED Provider Notes (Signed)
CSN: [id number]     Arrival date & time 06/06/16  1655 History   First MD Initiated Contact with Patient 06/06/16 1706     Chief Complaint  Patient presents with  . Leg Pain     (Consider location/radiation/quality/duration/timing/severity/associated sxs/prior Treatment) HPI Laura Griffith is a 82 y.o. female multiple medical problems, currently bedridden secondary to left femur fracture. Patient on Eliquis. Patient reportedly was transitioning on Monday from bed to wheelchair, fell forward onto knees. CNA was able to give patient back in bed, but right thigh pain persisted and gradually got worse from Tuesday to Wednesday. Daughter at bedside reports she called patient's PCP, mobile x-ray done in PCP said patient had right femur fracture. Patient family do not wish to have any surgery, prefer immobilization as patient will not walk again.  Past Medical History  Diagnosis Date  . Obesity   . Depression   . HTN (hypertension)   . Hyperlipemia   . CKD (chronic kidney disease), stage III     baseline 1.6-1.8 - Cr 0.7 (`00) 1.6  8/02  1.5 (2/03), Cr. 0.9  8/03 1.1 (11/05), Creatinine Clearance 8/02 about 50 ml/min MSE:  30/30 (09/2006)  . Aortic aneurysm ([place])     a. Followed by Dr. Roxan Hockey;  b. 01/2013 MR angio: 4.7 x 4.9 cm Asc Ao Aneurysm - stable.  . Atrial fibrillation ([place])     a. Noted initially at adenosine myoview 8/10 --> chronic atrial fibrillation.  . Femur fracture, left ([place])     a. 11/2-14 s/p IM nail  . DVT (deep venous thrombosis) ([facility])     a. s/p IVC filter.  . Chronic diastolic CHF (congestive heart failure) ([place])     a. 09/2012 Echo: EF 50-55%, mild LVH, mild MR, mod dil LA, mild-mod TR, mildly increased PASP.  Marland Kitchen History of pulmonary embolism     a. s/p IVC filter;  b. chronic coumadin  . Skin cancer of face   . H/O cardiovascular stress test     a. 12/2010, normal myoview.  . Osteoarthritis    Past Surgical History  Procedure Laterality Date  . Cataract  extraction    . Cholecystectomy    . Hernia repair    . Hysterotomy    . Osteotomy    . Orif deltoid ligament Right 05/2003    Archie Endo 7/8/20014 (09/28/2013)  . Orif tibia & fibula fractures Right 06/17/2007    Archie Endo 06/19/2007 (09/28/2013)  . Tonsillectomy and adenoidectomy  1940's    Archie Endo 06/17/2007 (09/28/2013)  . Total knee arthroplasty Right 2004    Archie Endo 06/17/2007 (09/28/2013)  . Vena cava filter placement      /medical hx notes (09/28/2013)  . Femur im nail Left 09/29/2013    Procedure: INTRAMEDULLARY (IM) RETROGRADE FEMORAL NAILING;  Surgeon: Renette Butters, MD;  Location: [facility];  Service: Orthopedics;  Laterality: Left;   Family History  Problem Relation Age of Onset  . Breast cancer Mother   . Heart disease Sister   . Heart disease Brother    Social History  Substance Use Topics  . Smoking status: Never Smoker   . Smokeless tobacco: Never Used  . Alcohol Use: No   OB History    No data available     Review of Systems A 10 point review of systems was completed and was negative except for pertinent positives and negatives as mentioned in the history of present illness     Allergies  Sulfamethoxazole  Home Medications  Prior to Admission medications   Medication Sig Start Date End Date Taking? Authorizing Provider  acetaminophen (TYLENOL) 500 MG tablet Take 1 tablet (500 mg total) by mouth every 6 (six) hours as needed for mild pain, moderate pain or headache. 10/05/13  Yes Olam Idler, MD  atorvastatin (LIPITOR) 40 MG tablet Take 1 tablet (40 mg total) by mouth daily at 6 PM. 10/05/13  Yes Olam Idler, MD  Calcium Carbonate-Vitamin D (CALCIUM-VITAMIN D) 500-200 MG-UNIT per tablet Take 1 tablet by mouth 2 (two) times daily with a meal. 05/12/12  Yes Carolin Guernsey, MD  docusate sodium (COLACE) 100 MG capsule Take 1 capsule (100 mg total) by mouth 2 (two) times daily. 10/06/13  Yes Bernadene Bell, MD  donepezil (ARICEPT) 5 MG tablet Take 5 mg by mouth  at bedtime.   Yes Historical Provider, MD  ELIQUIS 5 MG TABS tablet TAKE 1 TABLET (5 MG TOTAL) BY MOUTH 2 (TWO) TIMES DAILY. 11/24/14  Yes Larey Dresser, MD  fish oil-omega-3 fatty acids 1000 MG capsule Take 2 capsules (2 g total) by mouth daily. 05/12/12  Yes Carolin Guernsey, MD  furosemide (LASIX) 40 MG tablet Take 1 tablet (40 mg total) by mouth 2 (two) times daily. 10/06/13  Yes Bernadene Bell, MD  HYDROcodone-acetaminophen (NORCO/VICODIN) 5-325 MG tablet Take 1 tablet by mouth every 6 (six) hours as needed for moderate pain.   Yes Historical Provider, MD  Hydrocodone-Acetaminophen 2.5-325 MG TABS Take 1 tablet by mouth every 6 (six) hours as needed. 10/06/13  Yes Bernadene Bell, MD  metoprolol succinate (TOPROL-XL) 25 MG 24 hr tablet Take 3 tablets (75 mg total) by mouth daily. 10/06/13  Yes Bernadene Bell, MD  sertraline (ZOLOFT) 100 MG tablet Take 100 mg by mouth daily.   Yes Historical Provider, MD  traMADol (ULTRAM) 50 MG tablet Take 2 tablets (100 mg total) by mouth every 12 (twelve) hours as needed for moderate pain or severe pain. 12/23/13  Yes Leone Brand, MD  vitamin B-12 (CYANOCOBALAMIN) 1000 MCG tablet Take 1 tablet (1,000 mcg total) by mouth daily. Patient taking differently: Take 1,000 mcg by mouth 2 (two) times daily.  01/05/13  Yes Carolin Guernsey, MD  VITAMIN E PO Take 1 tablet by mouth daily.   Yes Historical Provider, MD  feeding supplement, ENSURE COMPLETE, (ENSURE COMPLETE) LIQD Take 237 mLs by mouth 2 (two) times daily as needed (please offer if meal intake is poor). Patient not taking: Reported on 06/06/2016 10/06/13   Bernadene Bell, MD  Ferrous Sulfate (IRON) 325 (65 FE) MG TABS Take 1 tablet by mouth 2 (two) times daily with a meal. Patient not taking: Reported on 06/06/2016 10/06/13   Bernadene Bell, MD  metoprolol succinate (TOPROL-XL) 50 MG 24 hr tablet TAKE 1 AND 1/2 TABLET BY MOUTH DAILY WITH OR IMMEDIATELY FOLLOWING A MEAL (CALL FOR APPT.) Patient not taking:  Reported on 06/06/2016    Larey Dresser, MD  oxybutynin (DITROPAN-XL) 5 MG 24 hr tablet TAKE 1 TABLET BY MOUTH DAILY Patient not taking: Reported on 06/06/2016 11/14/13   Leone Brand, MD  oxyCODONE-acetaminophen (PERCOCET/ROXICET) 5-325 MG tablet Take 1-2 tablets by mouth every 4 (four) hours as needed for severe pain. 06/06/16   Comer Locket, PA-C  sulfamethoxazole-trimethoprim (BACTRIM DS) 800-160 MG per tablet Take 1 tablet by mouth 2 (two) times daily. For 10 days Patient not taking: Reported on 06/06/2016 05/19/14   Tillie Rung  Lamar Benes, MD   BP 98/81 mmHg  Pulse 94  Temp(Src) 98.3 F (36.8 C) (Oral)  Resp 20  SpO2 92% Physical Exam  Constitutional: She is oriented to person, place, and time. She appears well-developed and well-nourished.  Morbidly obese. No apparent distress  HENT:  Head: Normocephalic and atraumatic.  Mouth/Throat: Oropharynx is clear and moist.  Eyes: Conjunctivae are normal. Pupils are equal, round, and reactive to light. Right eye exhibits no discharge. Left eye exhibits no discharge. No scleral icterus.  Neck: Neck supple.  Cardiovascular: Normal rate, regular rhythm and normal heart sounds.   Pulmonary/Chest: Effort normal and breath sounds normal. No respiratory distress. She has no wheezes. She has no rales.  Abdominal: Soft. There is no tenderness.  Musculoskeletal:  Tenderness diffusely throughout the distal aspect of right femur. Distal pulses intact with brisk cap refill.  Neurological: She is alert and oriented to person, place, and time.  Cranial Nerves II-XII grossly intact. Able to move toes and ankle distal to injury. Sensation intact to light touch.  Skin: Skin is warm and dry. No rash noted.  Psychiatric: She has a normal mood and affect.  Nursing note and vitals reviewed.   ED Course  Procedures (including critical care time) Labs Review Labs Reviewed - No data to display  Imaging Review Dg Ankle Complete Left  06/06/2016  CLINICAL DATA:   82 y/o  F; status post fall. EXAM: LEFT ANKLE COMPLETE - 3+ VIEW COMPARISON:  Concurrent radiographs of the left foot. FINDINGS: No acute fracture or dislocation is identified. The talar dome is intact. The ankle mortise is well aligned. Soft tissue swelling about the medial ankle joint. Severe demineralization. IMPRESSION: No acute fracture or dislocation is identified. Electronically Signed   By: Kristine Garbe M.D.   On: 06/06/2016 19:26   Dg Foot Complete Left  06/06/2016  CLINICAL DATA:  82 y/o  F; status post fall. EXAM: LEFT FOOT - COMPLETE 3+ VIEW COMPARISON:  None. FINDINGS: The bones are demineralized. Lisfranc alignment is preserved. No acute fracture or dislocation is identified. IMPRESSION: No acute fracture or dislocation is identified. Electronically Signed   By: Kristine Garbe M.D.   On: 06/06/2016 19:25   Dg Femur, Min 2 Views Right  06/06/2016  CLINICAL DATA:  Pain, bruising, obesity, right femur fracture EXAM: RIGHT FEMUR 2 VIEWS COMPARISON:  None available FINDINGS: Advanced degenerative arthropathy of the right hip with sclerosis, joint space loss and bony spurring. No malalignment at the hip joint. Proximal femur appears grossly intact. Distal femur demonstrates a displaced acute fracture through the metaphysis above the femoral prosthetic hardware for the knee replacement. This is best demonstrated on the cross-table lateral view. Large joint effusion present. Bones are osteopenic. Remote right knee arthroplasty and fixation hardware of the right distal tibia. Healed fibular fracture also noted. IMPRESSION: Acute displaced and angulated right distal femur metaphysis Fracture above the femoral condylar hardware. Associated large right knee joint effusion/hemarthrosis. Electronically Signed   By: Jerilynn Mages.  Shick M.D.   On: 06/06/2016 19:25   I have personally reviewed and evaluated these images and lab results as part of my medical decision-making.   EKG  Interpretation None      MDM  Patient with previous history of left femur fracture, she is immobile and bedridden per daughter at bedside. Here for evaluation of right-sided leg pain since fall Monday found to have a distal right femur fracture that is displaced and angulated with associated joint effusion and hemarthrosis. She remains neurovascularly  intact. We have discussed results of femur fracture they have refused surgery at this time given patient's age, past medical history and functional status. They request knee immobilizer, pain management. We'll follow up with PCP. I have also given them a referral to orthopedics for follow-up. Prior to patient discharge, I discussed and reviewed this case with Dr.Pickering   Final diagnoses:  Closed fracture of distal end of right femur, unspecified fracture morphology, initial encounter [facility])        Comer Locket, PA-C 06/07/16 [place], MD 06/07/16 1555

## 2017-01-17 DEATH — deceased
